# Patient Record
Sex: Female | Born: 1965 | Race: White | Hispanic: No | Marital: Married | Smoking: Former smoker
Health system: Southern US, Community
[De-identification: ages and names within clinical notes are randomized; demographics above are authoritative.]

## PROBLEM LIST (undated history)

## (undated) DIAGNOSIS — Z9889 Other specified postprocedural states: Secondary | ICD-10-CM

## (undated) DIAGNOSIS — O3440 Maternal care for other abnormalities of cervix, unspecified trimester: Secondary | ICD-10-CM

## (undated) DIAGNOSIS — K219 Gastro-esophageal reflux disease without esophagitis: Secondary | ICD-10-CM

## (undated) DIAGNOSIS — K912 Postsurgical malabsorption, not elsewhere classified: Secondary | ICD-10-CM

## (undated) DIAGNOSIS — N393 Stress incontinence (female) (male): Secondary | ICD-10-CM

## (undated) DIAGNOSIS — J302 Other seasonal allergic rhinitis: Secondary | ICD-10-CM

## (undated) HISTORY — DX: Postsurgical malabsorption, not elsewhere classified: K91.2

## (undated) HISTORY — DX: Morbid (severe) obesity due to excess calories: E66.01

---

## 1989-09-26 HISTORY — PX: CHOLECYSTECTOMY: SHX55

## 1989-09-26 HISTORY — PX: TUBAL LIGATION: SHX77

## 2000-09-13 ENCOUNTER — Encounter: Admission: RE | Admit: 2000-09-13 | Discharge: 2000-10-03 | Payer: Self-pay | Admitting: Occupational Medicine

## 2001-11-18 ENCOUNTER — Emergency Department (HOSPITAL_COMMUNITY): Admission: EM | Admit: 2001-11-18 | Discharge: 2001-11-19 | Payer: Self-pay | Admitting: *Deleted

## 2001-11-19 ENCOUNTER — Encounter: Payer: Self-pay | Admitting: Emergency Medicine

## 2001-11-20 ENCOUNTER — Emergency Department (HOSPITAL_COMMUNITY): Admission: EM | Admit: 2001-11-20 | Discharge: 2001-11-20 | Payer: Self-pay

## 2001-12-28 ENCOUNTER — Encounter: Admission: RE | Admit: 2001-12-28 | Discharge: 2001-12-28 | Payer: Self-pay | Admitting: Family Medicine

## 2002-01-21 ENCOUNTER — Encounter: Admission: RE | Admit: 2002-01-21 | Discharge: 2002-01-21 | Payer: Self-pay | Admitting: Family Medicine

## 2002-01-21 ENCOUNTER — Encounter (INDEPENDENT_AMBULATORY_CARE_PROVIDER_SITE_OTHER): Payer: Self-pay | Admitting: *Deleted

## 2002-01-28 ENCOUNTER — Encounter: Admission: RE | Admit: 2002-01-28 | Discharge: 2002-01-28 | Payer: Self-pay | Admitting: Family Medicine

## 2002-02-04 ENCOUNTER — Encounter: Admission: RE | Admit: 2002-02-04 | Discharge: 2002-02-04 | Payer: Self-pay | Admitting: Family Medicine

## 2002-02-05 ENCOUNTER — Encounter: Admission: RE | Admit: 2002-02-05 | Discharge: 2002-02-05 | Payer: Self-pay | Admitting: Family Medicine

## 2002-02-18 ENCOUNTER — Encounter: Admission: RE | Admit: 2002-02-18 | Discharge: 2002-02-18 | Payer: Self-pay | Admitting: Family Medicine

## 2002-02-20 ENCOUNTER — Encounter: Admission: RE | Admit: 2002-02-20 | Discharge: 2002-02-20 | Payer: Self-pay | Admitting: Family Medicine

## 2002-03-20 ENCOUNTER — Encounter: Admission: RE | Admit: 2002-03-20 | Discharge: 2002-03-20 | Payer: Self-pay | Admitting: Family Medicine

## 2002-04-10 ENCOUNTER — Encounter: Admission: RE | Admit: 2002-04-10 | Discharge: 2002-04-10 | Payer: Self-pay | Admitting: Family Medicine

## 2002-05-22 ENCOUNTER — Encounter: Admission: RE | Admit: 2002-05-22 | Discharge: 2002-05-22 | Payer: Self-pay | Admitting: Family Medicine

## 2002-07-22 ENCOUNTER — Encounter: Admission: RE | Admit: 2002-07-22 | Discharge: 2002-07-22 | Payer: Self-pay | Admitting: Family Medicine

## 2002-07-30 ENCOUNTER — Encounter: Payer: Self-pay | Admitting: *Deleted

## 2002-07-30 ENCOUNTER — Ambulatory Visit (HOSPITAL_COMMUNITY): Admission: RE | Admit: 2002-07-30 | Discharge: 2002-07-30 | Payer: Self-pay | Admitting: *Deleted

## 2002-08-09 ENCOUNTER — Encounter: Admission: RE | Admit: 2002-08-09 | Discharge: 2002-08-09 | Payer: Self-pay | Admitting: Obstetrics and Gynecology

## 2003-04-30 ENCOUNTER — Encounter: Admission: RE | Admit: 2003-04-30 | Discharge: 2003-04-30 | Payer: Self-pay

## 2003-05-06 ENCOUNTER — Encounter: Admission: RE | Admit: 2003-05-06 | Discharge: 2003-05-06 | Payer: Self-pay | Admitting: Family Medicine

## 2003-05-29 ENCOUNTER — Encounter: Admission: RE | Admit: 2003-05-29 | Discharge: 2003-05-29 | Payer: Self-pay | Admitting: Family Medicine

## 2003-06-05 ENCOUNTER — Encounter: Admission: RE | Admit: 2003-06-05 | Discharge: 2003-06-05 | Payer: Self-pay | Admitting: Obstetrics and Gynecology

## 2003-08-13 ENCOUNTER — Encounter: Admission: RE | Admit: 2003-08-13 | Discharge: 2003-08-13 | Payer: Self-pay | Admitting: Family Medicine

## 2003-08-20 ENCOUNTER — Encounter: Admission: RE | Admit: 2003-08-20 | Discharge: 2003-08-20 | Payer: Self-pay | Admitting: Sports Medicine

## 2003-12-08 ENCOUNTER — Encounter: Admission: RE | Admit: 2003-12-08 | Discharge: 2003-12-08 | Payer: Self-pay | Admitting: Family Medicine

## 2004-01-05 ENCOUNTER — Encounter: Admission: RE | Admit: 2004-01-05 | Discharge: 2004-01-05 | Payer: Self-pay | Admitting: Family Medicine

## 2004-05-27 ENCOUNTER — Encounter (INDEPENDENT_AMBULATORY_CARE_PROVIDER_SITE_OTHER): Payer: Self-pay | Admitting: *Deleted

## 2004-05-27 LAB — CONVERTED CEMR LAB

## 2004-06-11 ENCOUNTER — Encounter: Admission: RE | Admit: 2004-06-11 | Discharge: 2004-06-11 | Payer: Self-pay | Admitting: Sports Medicine

## 2004-06-18 ENCOUNTER — Ambulatory Visit: Payer: Self-pay | Admitting: Family Medicine

## 2004-09-22 ENCOUNTER — Ambulatory Visit (HOSPITAL_COMMUNITY): Admission: RE | Admit: 2004-09-22 | Discharge: 2004-09-22 | Payer: Self-pay | Admitting: Surgery

## 2004-09-23 ENCOUNTER — Encounter: Admission: RE | Admit: 2004-09-23 | Discharge: 2004-12-22 | Payer: Self-pay | Admitting: Surgery

## 2004-10-05 ENCOUNTER — Encounter: Admission: RE | Admit: 2004-10-05 | Discharge: 2004-10-05 | Payer: Self-pay | Admitting: Surgery

## 2004-10-26 ENCOUNTER — Ambulatory Visit: Payer: Self-pay | Admitting: Obstetrics and Gynecology

## 2004-10-26 ENCOUNTER — Other Ambulatory Visit: Admission: RE | Admit: 2004-10-26 | Discharge: 2004-10-26 | Payer: Self-pay | Admitting: Obstetrics and Gynecology

## 2004-11-09 ENCOUNTER — Ambulatory Visit: Payer: Self-pay | Admitting: Obstetrics and Gynecology

## 2005-05-31 ENCOUNTER — Ambulatory Visit: Payer: Self-pay | Admitting: Family Medicine

## 2005-06-03 ENCOUNTER — Ambulatory Visit: Payer: Self-pay | Admitting: Family Medicine

## 2005-11-02 ENCOUNTER — Encounter: Admission: RE | Admit: 2005-11-02 | Discharge: 2005-11-02 | Payer: Self-pay | Admitting: Sports Medicine

## 2005-11-03 ENCOUNTER — Encounter (INDEPENDENT_AMBULATORY_CARE_PROVIDER_SITE_OTHER): Payer: Self-pay | Admitting: Specialist

## 2005-11-03 ENCOUNTER — Other Ambulatory Visit: Admission: RE | Admit: 2005-11-03 | Discharge: 2005-11-03 | Payer: Self-pay | Admitting: Family Medicine

## 2005-11-03 ENCOUNTER — Ambulatory Visit: Payer: Self-pay | Admitting: Family Medicine

## 2005-12-13 ENCOUNTER — Ambulatory Visit: Payer: Self-pay | Admitting: Sports Medicine

## 2006-09-06 ENCOUNTER — Ambulatory Visit: Payer: Self-pay | Admitting: Sports Medicine

## 2006-11-23 DIAGNOSIS — E785 Hyperlipidemia, unspecified: Secondary | ICD-10-CM | POA: Insufficient documentation

## 2006-11-23 DIAGNOSIS — I10 Essential (primary) hypertension: Secondary | ICD-10-CM

## 2006-11-23 DIAGNOSIS — F17200 Nicotine dependence, unspecified, uncomplicated: Secondary | ICD-10-CM | POA: Insufficient documentation

## 2006-11-23 DIAGNOSIS — R87613 High grade squamous intraepithelial lesion on cytologic smear of cervix (HGSIL): Secondary | ICD-10-CM

## 2006-11-23 DIAGNOSIS — L732 Hidradenitis suppurativa: Secondary | ICD-10-CM

## 2006-11-24 ENCOUNTER — Encounter (INDEPENDENT_AMBULATORY_CARE_PROVIDER_SITE_OTHER): Payer: Self-pay | Admitting: *Deleted

## 2007-01-18 ENCOUNTER — Telehealth: Payer: Self-pay | Admitting: *Deleted

## 2007-01-19 ENCOUNTER — Ambulatory Visit: Payer: Self-pay | Admitting: Family Medicine

## 2007-02-01 ENCOUNTER — Encounter: Admission: RE | Admit: 2007-02-01 | Discharge: 2007-02-01 | Payer: Self-pay | Admitting: Sports Medicine

## 2007-02-02 ENCOUNTER — Encounter (INDEPENDENT_AMBULATORY_CARE_PROVIDER_SITE_OTHER): Payer: Self-pay | Admitting: Family Medicine

## 2007-02-02 ENCOUNTER — Ambulatory Visit: Payer: Self-pay | Admitting: Family Medicine

## 2007-02-02 DIAGNOSIS — N3946 Mixed incontinence: Secondary | ICD-10-CM

## 2007-02-02 LAB — CONVERTED CEMR LAB
Bilirubin Urine: NEGATIVE
Blood in Urine, dipstick: NEGATIVE
Glucose, Urine, Semiquant: NEGATIVE
Ketones, urine, test strip: NEGATIVE
Nitrite: NEGATIVE
Protein, U semiquant: NEGATIVE
Specific Gravity, Urine: 1.025
Urobilinogen, UA: 0.2
WBC Urine, dipstick: NEGATIVE
Whiff Test: POSITIVE
pH: 5.5

## 2007-02-06 ENCOUNTER — Telehealth: Payer: Self-pay | Admitting: *Deleted

## 2007-02-20 ENCOUNTER — Encounter (INDEPENDENT_AMBULATORY_CARE_PROVIDER_SITE_OTHER): Payer: Self-pay | Admitting: Family Medicine

## 2008-04-10 ENCOUNTER — Ambulatory Visit: Payer: Self-pay | Admitting: Family Medicine

## 2008-04-10 ENCOUNTER — Encounter: Admission: RE | Admit: 2008-04-10 | Discharge: 2008-04-10 | Payer: Self-pay | Admitting: Family Medicine

## 2008-04-10 LAB — CONVERTED CEMR LAB: Whiff Test: POSITIVE

## 2008-12-04 ENCOUNTER — Telehealth: Payer: Self-pay | Admitting: *Deleted

## 2008-12-04 ENCOUNTER — Ambulatory Visit: Payer: Self-pay | Admitting: Family Medicine

## 2009-06-11 ENCOUNTER — Ambulatory Visit: Payer: Self-pay | Admitting: Family Medicine

## 2009-06-11 LAB — CONVERTED CEMR LAB
Bilirubin Urine: NEGATIVE
Blood in Urine, dipstick: NEGATIVE
Glucose, Urine, Semiquant: NEGATIVE
Ketones, urine, test strip: NEGATIVE
Nitrite: NEGATIVE
Protein, U semiquant: NEGATIVE
Specific Gravity, Urine: 1.025
Urobilinogen, UA: 0.2
pH: 5.5

## 2009-07-07 ENCOUNTER — Encounter (INDEPENDENT_AMBULATORY_CARE_PROVIDER_SITE_OTHER): Payer: Self-pay | Admitting: Family Medicine

## 2009-07-07 ENCOUNTER — Ambulatory Visit: Payer: Self-pay | Admitting: Family Medicine

## 2009-07-07 LAB — CONVERTED CEMR LAB: Whiff Test: NEGATIVE

## 2009-07-09 ENCOUNTER — Encounter: Payer: Self-pay | Admitting: Family Medicine

## 2009-07-28 ENCOUNTER — Encounter: Payer: Self-pay | Admitting: Family Medicine

## 2009-07-28 ENCOUNTER — Ambulatory Visit: Payer: Self-pay | Admitting: Family Medicine

## 2009-07-29 ENCOUNTER — Encounter: Payer: Self-pay | Admitting: Family Medicine

## 2009-07-29 LAB — CONVERTED CEMR LAB
ALT: 20 units/L (ref 0–35)
AST: 17 units/L (ref 0–37)
Albumin: 4.2 g/dL (ref 3.5–5.2)
Alkaline Phosphatase: 54 units/L (ref 39–117)
BUN: 10 mg/dL (ref 6–23)
CO2: 24 meq/L (ref 19–32)
Calcium: 9.2 mg/dL (ref 8.4–10.5)
Chloride: 103 meq/L (ref 96–112)
Cholesterol: 199 mg/dL (ref 0–200)
Creatinine, Ser: 0.74 mg/dL (ref 0.40–1.20)
Glucose, Bld: 91 mg/dL (ref 70–99)
HDL: 33 mg/dL — ABNORMAL LOW (ref >39)
LDL Cholesterol: 144 mg/dL — ABNORMAL HIGH (ref 0–99)
Potassium: 4.8 meq/L (ref 3.5–5.3)
Sodium: 138 meq/L (ref 135–145)
Total Bilirubin: 0.3 mg/dL (ref 0.3–1.2)
Total CHOL/HDL Ratio: 6
Total Protein: 6.9 g/dL (ref 6.0–8.3)
Triglycerides: 108 mg/dL (ref <150)
VLDL: 22 mg/dL (ref 0–40)

## 2009-08-03 ENCOUNTER — Telehealth: Payer: Self-pay | Admitting: *Deleted

## 2009-08-05 ENCOUNTER — Encounter: Payer: Self-pay | Admitting: *Deleted

## 2009-09-24 ENCOUNTER — Telehealth: Payer: Self-pay | Admitting: Family Medicine

## 2009-09-24 ENCOUNTER — Ambulatory Visit: Payer: Self-pay | Admitting: Family Medicine

## 2009-09-29 ENCOUNTER — Ambulatory Visit (HOSPITAL_COMMUNITY)
Admission: RE | Admit: 2009-09-29 | Discharge: 2009-09-29 | Payer: Self-pay | Source: Home / Self Care | Admitting: Family Medicine

## 2009-10-01 ENCOUNTER — Encounter: Payer: Self-pay | Admitting: Family Medicine

## 2010-08-03 ENCOUNTER — Encounter: Payer: Self-pay | Admitting: Family Medicine

## 2010-08-24 ENCOUNTER — Ambulatory Visit: Payer: Self-pay | Admitting: Family Medicine

## 2010-10-26 NOTE — Miscellaneous (Signed)
   Clinical Lists Changes  Problems: Removed problem of URI (ICD-465.9) Removed problem of ROUTINE GYNECOLOGICAL EXAMINATION (ICD-V72.31) Removed problem of SCREENING FOR MALIGNANT NEOPLASM OF THE CERVIX (ICD-V76.2) Removed problem of VAGINITIS NOS (ICD-616.10)

## 2010-10-26 NOTE — Assessment & Plan Note (Signed)
Summary: congestion/headaches/bmc   Vital Signs:  Patient profile:   45 year old female Weight:      247.4 pounds Temp:     98.1 degrees F oral Pulse rate:   84 / minute BP sitting:   130 / 76  (left arm) Cuff size:   large  Vitals Entered By: Arlyss Repress CMA, (August 24, 2010 10:51 AM) CC: cough and congestion x 2 weeks. Is Patient Diabetic? No Pain Assessment Patient in pain? no        Primary Care Provider:  Clementeen Graham MD  CC:  cough and congestion x 2 weeks.Marland Kitchen  History of Present Illness: 45 yo F:  1. Cough: x 2 weeks, associated with nasal congestion, runny nose, intermittent sinus HA, right ear pain, sore throat, chest congestion, nausea, and subjective fever/chills. Denies dizziness, CP, SOB, LE edema, rash. + sick contacts. + tobacco - 1 ppd x 5 years. Denies asthma or allergies.  Habits & Providers  Alcohol-Tobacco-Diet     Tobacco Status: current     Tobacco Counseling: to quit use of tobacco products     Cigarette Packs/Day: 1.0  Current Medications (verified): 1)  Zyrtec Allergy 10 Mg Caps (Cetirizine Hcl) .Marland Kitchen.. 1 By Mouth Once Daily For Sinus Pressure 2)  Chantix 1 Mg Tabs (Varenicline Tartrate) .... Take One Twice Daily By Mouth. Do Not Fill Until 08/26/09 3)  Chantix Starting Month Pak 0.5 Mg X 11 & 1 Mg X 42 Tabs (Varenicline Tartrate) .... Take As Directed. 4)  Amoxicillin 500 Mg Caps (Amoxicillin) .... Two Tabs Two Times A Day For 7 Days 5)  Flonase 50 Mcg/act Susp (Fluticasone Propionate) .... Two Squirts in Each Nostril Daily 6)  Amoxicillin 500 Mg Tabs (Amoxicillin) .... One By Mouth Daily X 3 Days 7)  Tessalon Perles 100 Mg  Caps (Benzonatate) .... One By Mouth Three Times A Day As Needed Cough 8)  Fluticasone Propionate 50 Mcg/act  Susp (Fluticasone Propionate) .... 2 Sprays Each Nostril Once Daily  Allergies (verified): 1)  Avelox PMH-FH-SH reviewed for relevance  Social History: Packs/Day:  1.0  Review of Systems      See  HPI  Physical Exam  General:  Alert, overweight, in no acute distress. Vitals reviewed. Head:  Normocephalic and atraumatic without obvious abnormalities.  Eyes:  No injection. Ears:  R ear normal and L ear normal.   Nose:  Nasal discharge, mucosal pallor.   Mouth:  Oral mucosa and oropharynx without lesions or exudates.  PND. Neck:  No deformities, masses, or tenderness noted. Lungs:  Normal respiratory effort, chest expands symmetrically. Lungs are clear to auscultation, no crackles or wheezes. Heart:  Normal rate and regular rhythm.   Pulses:  R and L cdorsalis pedis and posterior tibial pulses are full and equal bilaterally. Extremities:  No edema. Skin:  Intact without suspicious lesions or rashes.   Impression & Recommendations:  Problem # 1:  UPPER RESPIRATORY INFECTION (ICD-465.9) Assessment New  URI, likely viral. Discussed symptomatic treatment. Rx Tessalon. Suggested OTC Zyrtec/Claritin to cover allergy component as well. Patient stated that Zyrtec "knocks her out," and that Claritin "doesn't work." Therapist, occupational. Patient requests Abx. Rx Amox, but told to hold and only Rx if worsens. Note given for work. Advised patient to stop smoking. Her updated medication list for this problem includes:    Zyrtec Allergy 10 Mg Caps (Cetirizine hcl) .Marland Kitchen... 1 by mouth once daily for sinus pressure    Tessalon Perles 100 Mg Caps (Benzonatate) ..... One by  mouth three times a day as needed cough  Orders: [facility]- Est Level  3 (16109)  Problem # 2:  TOBACCO DEPENDENCE (ICD-305.1) Assessment: Unchanged  Counseled to quit. She is not ready.  Orders: [facility]- Est Level  3 (60454)  Complete Medication List: 1)  Zyrtec Allergy 10 Mg Caps (Cetirizine hcl) .Marland Kitchen.. 1 by mouth once daily for sinus pressure 2)  Chantix 1 Mg Tabs (Varenicline tartrate) .... Take one twice daily by mouth. do not fill until 08/26/09 3)  Chantix Starting Month Pak 0.5 Mg X 11 & 1 Mg X 42 Tabs (Varenicline tartrate) .... Take  as directed. 4)  Amoxicillin 500 Mg Caps (Amoxicillin) .... Two tabs two times a day for 7 days 5)  Flonase 50 Mcg/act Susp (Fluticasone propionate) .... Two squirts in each nostril daily 6)  Amoxicillin 500 Mg Tabs (Amoxicillin) .... One by mouth daily x 3 days 7)  Tessalon Perles 100 Mg Caps (Benzonatate) .... One by mouth three times a day as needed cough 8)  Fluticasone Propionate 50 Mcg/act Susp (Fluticasone propionate) .... 2 sprays each nostril once daily  Patient Instructions: 1)  It was nice to meet you today! Prescriptions: FLUTICASONE PROPIONATE 50 MCG/ACT  SUSP (FLUTICASONE PROPIONATE) 2 sprays each nostril once daily  #1 vial x 1   Entered and Authorized by:   Helane Rima DO   Signed by:   Helane Rima DO on 08/24/2010   Method used:   Print then Give to Patient   RxID:   [id number] TESSALON PERLES 100 MG  CAPS (BENZONATATE) one by mouth three times a day as needed cough  #24 x 0   Entered and Authorized by:   Helane Rima DO   Signed by:   Helane Rima DO on 08/24/2010   Method used:   Print then Give to Patient   RxID:   [id number] AMOXICILLIN 500 MG TABS (AMOXICILLIN) one by mouth daily x 3 days  #3 x 0   Entered and Authorized by:   Helane Rima DO   Signed by:   Helane Rima DO on 08/24/2010   Method used:   Print then Give to Patient   RxID:   [id number]    Orders Added: 1)  [facility]- Est Level  3 [44034]

## 2010-10-26 NOTE — Miscellaneous (Signed)
Summary: [facility] CT of sinuses   Clinical Lists Changes  Observations: Added new observation of CT OF SINUS: Normal exam, no inflammation, normal middle ears. (09/29/2009 8:19)      CT of Sinus  Procedure date:  09/29/2009  Findings:      Normal exam, no inflammation, normal middle ears.

## 2011-02-11 NOTE — Group Therapy Note (Signed)
NAME:  Brenda Ware NO.:  [id number]   MEDICAL RECORD NO.:  [id number]          PATIENT TYPE:  WOC   LOCATION:  [facility]                   FACILITY:  [facility]   PHYSICIAN:  Argentina Donovan, MD        DATE OF BIRTH:  1966-07-03   DATE OF SERVICE:  10/26/2004                                    CLINIC NOTE   CHIEF COMPLAINT:  Follow-up cervical dysplasia.   SUBJECTIVE:  [name] is here today for follow-up.  Unfortunately, she was  lost to follow-up for some amount of time.  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  A LEEP  was attempted at that time but the cervix could not be reached due to  inappropriate equipment.  She then did not follow up for quite some time.  A  repeat Pap in 2004 showed equivocal high-risk HPV and ASCUS.  She then had a  Pap smear at the [facility] in September 2005 which showed CIN-1  and possibly higher grade.  She is here today for treatment.  She currently  has no complaints.   On exam, a left upper thigh abscess is noted.  The patient states it has  been there for about a week or two and states she has another one near her  groin.  Denies any shaving in those areas and denies any medical problems  such as diabetes.  She has been on Flagyl for a mouth infection but has not  been treated for the abscesses.   OBJECTIVE:  VITAL SIGNS:  Noted.  GENERAL:  She is an overweight female in no acute distress.  EXTREMITIES:  Left upper thigh shows an erythematous, fluctuant abscess  approximately 1.5 x 1.5 cm with some pain to palpation.  PELVIC:  Reveals normal external female genitalia, normal urethra and  vaginal walls.  Cervix by colposcopy shows some acetowhite throughout the 12  o'clock and 6 o'clock regions.  There is some fine vascularization.   PROCEDURE:  The patient was consented for a colposcopy and LEEP.  She was  placed in lithotomy position.  The large insulated  speculum and side wall  retractor were inserted.  A colposcope was used with acetowhite to determine  the appropriate transformation zone and abnormal cells.  Xylocaine  approximately 1 mL at each location was used at 2, 4, 8, and 10 o'clock.  There was minimal bleeding.  A Fischer LEEP was used to remove the abnormal  area.  The Bovie was used to cauterize the zone between the removed tissue  and healthy cervix.  Monsel's solution was used for excellent hemostasis.  The patient tolerated the procedure without complications.   ASSESSMENT AND PLAN:  1.  Cervical dysplasia.  She is now status post loop electrocautery excision      procedure.  She will return in 2 weeks for follow-up.  She was told of      the complications that she should return immediately for including      severe bleeding, cramping, and fevers.  2.  Folliculitis with abscess.  Sitz baths were recommended and she was      written for Keflex 500 t.i.d. for 1 week.   She is to return in 2 weeks for follow-up.      LC/MEDQ  D:  10/26/2004  T:  10/26/2004  Job:  [id number]

## 2011-02-11 NOTE — Group Therapy Note (Signed)
   NAME:  Brenda Ware, Brenda Ware                           ACCOUNT NO.:  [id number]   MEDICAL RECORD NO.:  [id number]                   PATIENT TYPE:  OUT   LOCATION:  [facility]                           FACILITY:  [facility]   PHYSICIAN:  Argentina Donovan, MD                     DATE OF BIRTH:  [birth year]   DATE OF SERVICE:  06/05/2003                                    CLINIC NOTE   HISTORY OF PRESENT ILLNESS:  The patient is a 45 year old patient who had an  abnormal Pap smear some time last year.  Had a colposcopy in May of 2003  that showed CIN II.  She was scheduled for a LEEP and was seen at [facility]  in November of 2003.  At that time cervical access which was deviated to the  right vaginal wall did not give the surgeon enough clearance to do the LEEP  and no large speculum was available at that time and she was to be  rescheduled.  Unfortunately, she has not shown up until now and on  examination with a large speculum we easily see and can isolate the cervix.  I would use a large Graves speculum and a lateral retractor to accomplish  the LEEP which I think could be done quite easily in the office if it needs  to be done.  In order to check on this we have repeated the Pap smear and if  it is abnormal we will go ahead and do the LEEP without a preoperative  colposcopy.  Will do that at the same time as she comes in for examination  and treat.   IMPRESSION:  Previous cervical dysplasia, untreated.                                               Argentina Donovan, MD    PR/MEDQ  D:  06/05/2003  T:  06/05/2003  Job:  [id number]

## 2011-07-26 ENCOUNTER — Other Ambulatory Visit: Payer: Self-pay | Admitting: Family Medicine

## 2011-07-26 DIAGNOSIS — Z1231 Encounter for screening mammogram for malignant neoplasm of breast: Secondary | ICD-10-CM

## 2011-08-01 ENCOUNTER — Other Ambulatory Visit (HOSPITAL_COMMUNITY)
Admission: RE | Admit: 2011-08-01 | Discharge: 2011-08-01 | Disposition: A | Discharge status: Home / Self Care | Payer: BC Managed Care – PPO | Source: Ambulatory Visit | Attending: Family Medicine | Admitting: Family Medicine

## 2011-08-01 ENCOUNTER — Encounter: Payer: Self-pay | Admitting: Family Medicine

## 2011-08-01 ENCOUNTER — Ambulatory Visit (INDEPENDENT_AMBULATORY_CARE_PROVIDER_SITE_OTHER): Payer: BC Managed Care – PPO | Admitting: Family Medicine

## 2011-08-01 VITALS — BP 122/72 | HR 78 | Temp 98.1°F | Ht 65.0 in | Wt 261.0 lb

## 2011-08-01 DIAGNOSIS — F17200 Nicotine dependence, unspecified, uncomplicated: Secondary | ICD-10-CM

## 2011-08-01 DIAGNOSIS — J309 Allergic rhinitis, unspecified: Secondary | ICD-10-CM

## 2011-08-01 DIAGNOSIS — N3946 Mixed incontinence: Secondary | ICD-10-CM

## 2011-08-01 DIAGNOSIS — Z124 Encounter for screening for malignant neoplasm of cervix: Secondary | ICD-10-CM

## 2011-08-01 DIAGNOSIS — R87613 High grade squamous intraepithelial lesion on cytologic smear of cervix (HGSIL): Secondary | ICD-10-CM

## 2011-08-01 DIAGNOSIS — Z01419 Encounter for gynecological examination (general) (routine) without abnormal findings: Secondary | ICD-10-CM | POA: Insufficient documentation

## 2011-08-02 ENCOUNTER — Encounter: Payer: Self-pay | Admitting: Family Medicine

## 2011-08-02 DIAGNOSIS — J309 Allergic rhinitis, unspecified: Secondary | ICD-10-CM | POA: Insufficient documentation

## 2011-08-02 NOTE — Assessment & Plan Note (Signed)
Stress incontinence: to see urologist in December. Patient was also reminded of Kegel exercises.

## 2011-08-02 NOTE — Progress Notes (Signed)
  Subjective:    Patient ID: Brenda Ware, female    DOB: 1966-07-10, 45 y.o.   MRN: [id number]  HPI 45 yo female who presents for annual physical exam.  1. Her primary complaint is itching of the right eye with a little bit of swelling that started the day before. She denies any drainage, any pain, any change in vision, any crusted lesions or fever. She also states that she feels chronic sinus pressure in her ears and that has been going on for a couple of years. She cannot take zyrtec because it makes her drowsy and claritin does not work for her. She doesn't like taking nasal sprays so she hasn't ever taken flonase. The sinus pressure has been associated with some coughing.Symptoms are worst at night and seem associated with wheezing. She denies any shortness of breath. She finds that her symptoms are relieved with sudafed. She had a sinus CT done in 2011 that didn't show any abnormality.   2. Smoking cessation: She smokes 1 pack per day and is afraid to quit due to the weight gain.  3. Stress incontinence: leaks urine with coughing, sneezing and even sometimes without any exacerbation. 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 She knows about Keggel exercises and does them once in a while. She is scheduled to see a urologist in December.  4. Gyn: she is sexually active. She denies any discharge, any abnormal bleeding, any burning or itching. Her periods are regular, every month and last 3 days.     Review of Systems Negative except per HPI    Objective:   Physical Exam Filed Vitals:   08/01/11 1018  BP: 122/72  Pulse: 78  Temp: 98.1 F (36.7 C)  Physical Examination: General appearance - alert, well appearing, and in no distress and obese Eyes - pupils equal and reactive, extraocular eye movements intact, right eyelid mildly swollen in medial aspect. No discharge, no erythema, no tenderness to touch, conjunctiva  white and healthy.  Ears - bilateral TM's and external ear canals normal, minimal dulling of right membrane but no fluid level.  Nose - enflamed nasal turbinates.  Mouth - mucous membranes moist, pharynx normal without lesions and lymphoid hyperplasia noted in oropharynx Neck - supple, no significant adenopathy Chest - clear to auscultation, no wheezes, rales or rhonchi, symmetric air entry Heart - normal rate, regular rhythm, normal S1, S2, no murmurs, rubs, clicks or gallops Abdomen - soft, nontender, nondistended, no masses or organomegaly Breasts - firm cystic mass on left breast which pt states has been there since age of 45yo. Otherwise, no lumps or masses noted. No nipple discharge or skin changes.  Pelvic - normal external genitalia, vulva, vagina, cervix, uterus and adnexa, CERVIX: normal appearing cervix without discharge or lesions Extremities - peripheral pulses normal, no pedal edema, no clubbing or cyanosis        Assessment & Plan:

## 2011-08-02 NOTE — Assessment & Plan Note (Signed)
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 Her following pap smears have all been normal. Will obtain pap smear. Not at risk for GC/Chl.

## 2011-08-02 NOTE — Assessment & Plan Note (Signed)
Precontemplation stage: fear of quitting due to weight gain. Told patient that I would be available whenever she decided to quit or wanted more information and could provide her with support to avoid excessive weight gain if she were to choose to quit.

## 2011-08-02 NOTE — Assessment & Plan Note (Signed)
Will start patient on Allegra D which will hopefully not make her drowsy and also provide a decongestant. The swelling of her eyelid appears to be also due allergic reaction. No evidence of bacterial conjunctivitis.

## 2011-08-05 ENCOUNTER — Encounter: Payer: Self-pay | Admitting: Family Medicine

## 2011-08-15 ENCOUNTER — Ambulatory Visit (INDEPENDENT_AMBULATORY_CARE_PROVIDER_SITE_OTHER): Payer: BC Managed Care – PPO | Admitting: Family Medicine

## 2011-08-15 ENCOUNTER — Encounter: Payer: Self-pay | Admitting: Family Medicine

## 2011-08-15 VITALS — BP 134/91 | HR 80 | Temp 97.9°F | Ht 65.0 in | Wt 266.3 lb

## 2011-08-15 DIAGNOSIS — N390 Urinary tract infection, site not specified: Secondary | ICD-10-CM

## 2011-08-15 DIAGNOSIS — R358 Other polyuria: Secondary | ICD-10-CM

## 2011-08-15 DIAGNOSIS — R3589 Other polyuria: Secondary | ICD-10-CM

## 2011-08-15 LAB — POCT URINALYSIS DIPSTICK
Bilirubin, UA: NEGATIVE
Glucose, UA: NEGATIVE
Ketones, UA: NEGATIVE
Protein, UA: NEGATIVE
Spec Grav, UA: 1.025
Urobilinogen, UA: 0.2

## 2011-08-15 LAB — POCT UA - MICROSCOPIC ONLY

## 2011-08-15 MED ORDER — CEPHALEXIN 500 MG PO CAPS: 500.0000 mg | ORAL_CAPSULE | Freq: Two times a day (BID) | ORAL | Status: AC

## 2011-08-15 NOTE — Patient Instructions (Signed)

## 2011-08-15 NOTE — Progress Notes (Signed)
  Subjective:    Patient ID: Brenda Ware, female    DOB: 05-Mar-1966, 45 y.o.   MRN: [id number]  HPI DYSURIA  Onset: 3 days  Worsening: no    Symptoms Urgency: yes  Frequency: yes  Hesitancy: yes  Hematuria: no  Flank Pain: no  Fever: no   Highest Temp: n/a  Nausea/Vomiting: no  Pregnant: no STD exposure/history: no Discharge: no Irritants: no Rash: no  Red Flags  : (Risk Factors for Complicated UTI) Recent Antibiotic Usage (last 30 days): no  Symptoms lasting more than seven (7) days: no  More than 3 UTI's last 12 months: no  PMH of  1. DM: no 2. Renal Disease/Calculi: no 3. Urinary Tract Abnormality: no  4. Instrumentation/Trauma: no 5. Immunosuppression: no     Review of Systemssee hPI     Objective:   Physical Exam   GEN: Alert & Oriented, No acute distress CV:  Regular Rate & Rhythm, no murmur Respiratory:  Normal work of breathing, CTAB Abd:  + BS, soft, no tenderness to palpation. No flank pain      Assessment & Plan:

## 2011-08-15 NOTE — Assessment & Plan Note (Signed)
Keflex, gave patient info.  WIll culture

## 2011-08-22 NOTE — Progress Notes (Signed)
Clue cells notes, asymptomatic, will not treat at this time.

## 2011-09-06 ENCOUNTER — Ambulatory Visit: Payer: Self-pay

## 2012-01-26 ENCOUNTER — Other Ambulatory Visit: Payer: Self-pay | Admitting: Urology

## 2012-04-10 ENCOUNTER — Other Ambulatory Visit: Payer: Self-pay | Admitting: Urology

## 2012-04-10 ENCOUNTER — Encounter (HOSPITAL_BASED_OUTPATIENT_CLINIC_OR_DEPARTMENT_OTHER): Payer: Self-pay | Admitting: *Deleted

## 2012-04-16 NOTE — H&P (Signed)
History of Present Illness   Brenda Ware has stress incontinence that is stable. Her enuresis is dramatically better and we both think she was coughing at night and she no longer is doing that since her allergies are under good control now.  She has mild stable frequency.   She is highly motivated to get as dry as possible.  I drew her a picture and talked to her about physical therapy versus watchful waiting versus sling.  We talked about a sling in detail. Pros, cons, general surgical and anesthetic risks, and other options including behavioral therapy and watchful waiting were discussed. She understands that slings are generally successful in 90% of cases for stress incontinence, 50% for urge incontinence, and that in a small percentage of cases the incontinence can worsen. The risk of persistent, de novo, or worsening incontinence/dysfunction was discussed. Risks were described but not limited to the discussion of injury to neighboring structures including the bowel (with possible life-threatening sepsis and colostomy), bladder, urethra, vagina (all resulting in further surgery), and ureter (resulting in re-implantation). We also talked about the risk of retention requiring urethrolysis, extrusion requiring revision, and erosion resulting in further surgery. Bleeding risks and transfusion rates and the risk of infection were discussed. The risk of pelvic and abdominal pain syndromes, dyspareunia, and neuropathies were discussed. The need for CIC was described as well the usual post-operative course. The patient understands that she might not reach her treatment goal and that she might be worse following surgery.  Bleeding, pain and issues on TV were also discussed.  Review of systems: No change in bowel or neurologic status.  Urinalysis: I reviewed, negative.    Past Medical History Problems  1. History of  Hypercholesterolemia 272.0  Surgical History Problems  1. History of   Cholecystectomy 2. History of  Tubal Ligation V25.2  Current Meds 1. Toviaz 8 MG Oral Tablet Extended Release 24 Hour; TAKE 1 TABLET DAILY; Therapy: 22Mar2013  to (Evaluate:17Mar2014); Last Rx:22Mar2013  Allergies Medication  1. Doxycycline Hyclate CAPS  Family History Problems  1. Family history of  No Significant Family History  Social History Problems  1. Caffeine Use 2. Marital History - Single 3. Occupation: bus driver 4. Tobacco Use 305.1 14ppd x 5 yrs Denied  5. History of  Alcohol Use  Vitals Vital Signs [Data Includes: Last 1 Day]  01May2013 10:55AM  Blood Pressure: 140 / 83 Temperature: 98 F Heart Rate: 97  Results/Data  Urine [Data Includes: Last 1 Day]   01May2013  COLOR YELLOW   APPEARANCE CLEAR   SPECIFIC GRAVITY >1.030   pH 5.5   GLUCOSE NEG mg/dL  BILIRUBIN NEG   KETONE TRACE mg/dL  BLOOD MOD   PROTEIN NEG mg/dL  UROBILINOGEN 0.2 mg/dL  NITRITE NEG   LEUKOCYTE ESTERASE NEG   SQUAMOUS EPITHELIAL/HPF FEW   WBC 0-3 WBC/hpf  RBC 0-3 RBC/hpf  BACTERIA NONE SEEN   CRYSTALS NONE SEEN   CASTS NONE SEEN    Assessment Assessed  1. Enuresis Nocturnal 788.36 2. Female Stress Incontinence 625.6  Plan   Discussion/Summary   [name] would like to proceed with a sling. Hopefully she will reach her treatment goal.  History of Present Illness   Brenda Ware has stress incontinence that is stable. Her enuresis is dramatically better and we both think she was coughing at night and she no longer is doing that since her allergies are under good control now.  She has mild stable frequency.   She is highly  motivated to get as dry as possible.  I drew her a picture and talked to her about physical therapy versus watchful waiting versus sling.  We talked about a sling in detail. Pros, cons, general surgical and anesthetic risks, and other options including behavioral therapy and watchful waiting were discussed. She understands that slings are  generally successful in 90% of cases for stress incontinence, 50% for urge incontinence, and that in a small percentage of cases the incontinence can worsen. The risk of persistent, de novo, or worsening incontinence/dysfunction was discussed. Risks were described but not limited to the discussion of injury to neighboring structures including the bowel (with possible life-threatening sepsis and colostomy), bladder, urethra, vagina (all resulting in further surgery), and ureter (resulting in re-implantation). We also talked about the risk of retention requiring urethrolysis, extrusion requiring revision, and erosion resulting in further surgery. Bleeding risks and transfusion rates and the risk of infection were discussed. The risk of pelvic and abdominal pain syndromes, dyspareunia, and neuropathies were discussed. The need for CIC was described as well the usual post-operative course. The patient understands that she might not reach her treatment goal and that she might be worse following surgery.  Bleeding, pain and issues on TV were also discussed.  Review of systems: No change in bowel or neurologic status.  Urinalysis: I reviewed, negative.    Past Medical History Problems  1. History of  Hypercholesterolemia 272.0  Surgical History Problems  1. History of  Cholecystectomy 2. History of  Tubal Ligation V25.2  Current Meds 1. Toviaz 8 MG Oral Tablet Extended Release 24 Hour; TAKE 1 TABLET DAILY; Therapy: 22Mar2013  to (Evaluate:17Mar2014); Last Rx:22Mar2013  Allergies Medication  1. Doxycycline Hyclate CAPS  Family History Problems  1. Family history of  No Significant Family History  Social History Problems  1. Caffeine Use 2. Marital History - Single 3. Occupation: bus driver 4. Tobacco Use 305.1 14ppd x 5 yrs Denied  5. History of  Alcohol Use  Vitals Vital Signs [Data Includes: Last 1 Day]  01May2013 10:55AM  Blood Pressure: 140 / 83 Temperature: 98 F Heart Rate:  97  Results/Data  Urine [Data Includes: Last 1 Day]   01May2013  COLOR YELLOW   APPEARANCE CLEAR   SPECIFIC GRAVITY >1.030   pH 5.5   GLUCOSE NEG mg/dL  BILIRUBIN NEG   KETONE TRACE mg/dL  BLOOD MOD   PROTEIN NEG mg/dL  UROBILINOGEN 0.2 mg/dL  NITRITE NEG   LEUKOCYTE ESTERASE NEG   SQUAMOUS EPITHELIAL/HPF FEW   WBC 0-3 WBC/hpf  RBC 0-3 RBC/hpf  BACTERIA NONE SEEN   CRYSTALS NONE SEEN   CASTS NONE SEEN    Assessment Assessed  1. Enuresis Nocturnal 788.36 2. Female Stress Incontinence 625.6  Plan   Discussion/Summary   [name] would like to proceed with a sling. Hopefully she will reach her treatment goal.  After a thorough review of the management options for the patient's condition the patient  elected to proceed with surgical therapy as noted above. We have discussed the potential benefits and risks of the procedure, side effects of the proposed treatment, the likelihood of the patient achieving the goals of the procedure, and any potential problems that might occur during the procedure or recuperation. Informed consent has been obtained.

## 2012-04-16 NOTE — Progress Notes (Signed)
UNABLE TO REACH PT. SHE HAD LEFT VOICE MAIL MESSAGE TODAY TO CALL BACK, BUT WHEN CALLED BACK NO ANSWER.  LM ON PT PHONE [phone] AT 1654 TO ARRIVE AT 0815.  NPO AFTER MN. BRING LIST OF MEDS. NEEDS CBC, PT/PTT, T & S AND HX DONE ON ARRIVAL , DEPENDING ON HEALTH HX (? URINE PREG, IF PT HAS HTN OR DM NEEDS BMET ADDED TO LAB WORK) ALSO MAY NEED EKG.

## 2012-04-17 ENCOUNTER — Ambulatory Visit (HOSPITAL_BASED_OUTPATIENT_CLINIC_OR_DEPARTMENT_OTHER): Payer: BC Managed Care – PPO | Admitting: Anesthesiology

## 2012-04-17 ENCOUNTER — Encounter (HOSPITAL_BASED_OUTPATIENT_CLINIC_OR_DEPARTMENT_OTHER)
Admission: RE | Disposition: A | Discharge status: Home / Self Care | Payer: Self-pay | Source: Ambulatory Visit | Attending: Urology

## 2012-04-17 ENCOUNTER — Ambulatory Visit (HOSPITAL_BASED_OUTPATIENT_CLINIC_OR_DEPARTMENT_OTHER)
Admission: RE | Admit: 2012-04-17 | Discharge: 2012-04-17 | Disposition: A | Discharge status: Home / Self Care | Payer: BC Managed Care – PPO | Source: Ambulatory Visit | Attending: Urology | Admitting: Urology

## 2012-04-17 ENCOUNTER — Encounter (HOSPITAL_BASED_OUTPATIENT_CLINIC_OR_DEPARTMENT_OTHER): Payer: Self-pay | Admitting: *Deleted

## 2012-04-17 ENCOUNTER — Encounter (HOSPITAL_BASED_OUTPATIENT_CLINIC_OR_DEPARTMENT_OTHER): Payer: Self-pay | Admitting: Anesthesiology

## 2012-04-17 DIAGNOSIS — N393 Stress incontinence (female) (male): Secondary | ICD-10-CM | POA: Insufficient documentation

## 2012-04-17 DIAGNOSIS — N3944 Nocturnal enuresis: Secondary | ICD-10-CM | POA: Insufficient documentation

## 2012-04-17 DIAGNOSIS — E7800 Pure hypercholesterolemia, unspecified: Secondary | ICD-10-CM | POA: Insufficient documentation

## 2012-04-17 HISTORY — DX: Maternal care for other abnormalities of cervix, unspecified trimester: O34.40

## 2012-04-17 HISTORY — DX: Other seasonal allergic rhinitis: J30.2

## 2012-04-17 HISTORY — PX: BLADDER SUSPENSION: SHX72

## 2012-04-17 HISTORY — DX: Gastro-esophageal reflux disease without esophagitis: K21.9

## 2012-04-17 HISTORY — DX: Stress incontinence (female) (male): N39.3

## 2012-04-17 HISTORY — DX: Other specified postprocedural states: Z98.890

## 2012-04-17 LAB — CBC
MCH: 30.3 pg (ref 26.0–34.0)
MCHC: 34.8 g/dL (ref 30.0–36.0)
Platelets: 325 10*3/uL (ref 150–400)
RDW: 12.5 % (ref 11.5–15.5)

## 2012-04-17 LAB — TYPE AND SCREEN

## 2012-04-17 LAB — PROTIME-INR: Prothrombin Time: 12.8 seconds (ref 11.6–15.2)

## 2012-04-17 SURGERY — CYSTO
Anesthesia: General | Site: Uterus | Wound class: Clean Contaminated

## 2012-04-17 MED ORDER — GENTAMICIN IN SALINE 1.6-0.9 MG/ML-% IV SOLN
80.0000 mg | INTRAVENOUS | Status: AC
  Administered 2012-04-17: 80 mg via INTRAVENOUS

## 2012-04-17 MED ORDER — CIPROFLOXACIN HCL 250 MG PO TABS: 250.0000 mg | ORAL_TABLET | Freq: Two times a day (BID) | ORAL | Status: AC

## 2012-04-17 MED ORDER — DEXAMETHASONE SODIUM PHOSPHATE 4 MG/ML IJ SOLN
INTRAMUSCULAR | Status: DC | PRN
  Administered 2012-04-17: 5 mg via INTRAVENOUS

## 2012-04-17 MED ORDER — SUCCINYLCHOLINE CHLORIDE 20 MG/ML IJ SOLN
INTRAMUSCULAR | Status: DC | PRN
  Administered 2012-04-17: 100 mg via INTRAVENOUS

## 2012-04-17 MED ORDER — HYDROMORPHONE HCL PF 1 MG/ML IJ SOLN: 0.2500 mg | INTRAMUSCULAR | Status: DC | PRN

## 2012-04-17 MED ORDER — ONDANSETRON HCL 4 MG/2ML IJ SOLN
INTRAMUSCULAR | Status: DC | PRN
  Administered 2012-04-17: 4 mg via INTRAVENOUS

## 2012-04-17 MED ORDER — FENTANYL CITRATE 0.05 MG/ML IJ SOLN
INTRAMUSCULAR | Status: DC | PRN
  Administered 2012-04-17: 50 ug via INTRAVENOUS
  Administered 2012-04-17: 25 ug via INTRAVENOUS
  Administered 2012-04-17 (×2): 50 ug via INTRAVENOUS
  Administered 2012-04-17: 25 ug via INTRAVENOUS

## 2012-04-17 MED ORDER — SODIUM CHLORIDE 0.9 % IV SOLN
1.5000 g | INTRAVENOUS | Status: AC
  Administered 2012-04-17: 10:00:00 via INTRAVENOUS

## 2012-04-17 MED ORDER — PROMETHAZINE HCL 25 MG/ML IJ SOLN: 6.2500 mg | INTRAMUSCULAR | Status: DC | PRN

## 2012-04-17 MED ORDER — MIDAZOLAM HCL 5 MG/5ML IJ SOLN
INTRAMUSCULAR | Status: DC | PRN
  Administered 2012-04-17: 2 mg via INTRAVENOUS

## 2012-04-17 MED ORDER — LACTATED RINGERS IV SOLN: INTRAVENOUS | Status: DC

## 2012-04-17 MED ORDER — SODIUM CHLORIDE 0.9 % IR SOLN
Status: DC | PRN
  Administered 2012-04-17: 10:00:00

## 2012-04-17 MED ORDER — ESTRADIOL 0.1 MG/GM VA CREA
TOPICAL_CREAM | VAGINAL | Status: DC | PRN
  Administered 2012-04-17: 1 via VAGINAL

## 2012-04-17 MED ORDER — PROPOFOL 10 MG/ML IV EMUL
INTRAVENOUS | Status: DC | PRN
  Administered 2012-04-17: 200 mg via INTRAVENOUS

## 2012-04-17 MED ORDER — NEOSTIGMINE METHYLSULFATE 1 MG/ML IJ SOLN
INTRAMUSCULAR | Status: DC | PRN
  Administered 2012-04-17: 5 mg via INTRAVENOUS

## 2012-04-17 MED ORDER — LACTATED RINGERS IV SOLN
INTRAVENOUS | Status: DC
  Administered 2012-04-17 (×2): via INTRAVENOUS

## 2012-04-17 MED ORDER — ROCURONIUM BROMIDE 100 MG/10ML IV SOLN
INTRAVENOUS | Status: DC | PRN
  Administered 2012-04-17: 30 mg via INTRAVENOUS

## 2012-04-17 MED ORDER — DROPERIDOL 2.5 MG/ML IJ SOLN
INTRAMUSCULAR | Status: DC | PRN
  Administered 2012-04-17: 0.625 mg via INTRAVENOUS

## 2012-04-17 MED ORDER — ACETAMINOPHEN 10 MG/ML IV SOLN
INTRAVENOUS | Status: DC | PRN
  Administered 2012-04-17: 1000 mg via INTRAVENOUS

## 2012-04-17 MED ORDER — STERILE WATER FOR IRRIGATION IR SOLN
Status: DC | PRN
  Administered 2012-04-17: 3000 mL

## 2012-04-17 MED ORDER — INDIGOTINDISULFONATE SODIUM 8 MG/ML IJ SOLN
INTRAMUSCULAR | Status: DC | PRN
  Administered 2012-04-17: 5 mL via INTRAVENOUS

## 2012-04-17 MED ORDER — LIDOCAINE HCL (CARDIAC) 20 MG/ML IV SOLN
INTRAVENOUS | Status: DC | PRN
  Administered 2012-04-17: 50 mg via INTRAVENOUS

## 2012-04-17 MED ORDER — OXYCODONE-ACETAMINOPHEN 10-650 MG PO TABS: 1.0000 | ORAL_TABLET | Freq: Four times a day (QID) | ORAL | Status: AC | PRN

## 2012-04-17 MED ORDER — GLYCOPYRROLATE 0.2 MG/ML IJ SOLN
INTRAMUSCULAR | Status: DC | PRN
  Administered 2012-04-17: 0.6 mg via INTRAVENOUS

## 2012-04-17 MED ORDER — LIDOCAINE-EPINEPHRINE (PF) 1 %-1:200000 IJ SOLN
INTRAMUSCULAR | Status: DC | PRN
  Administered 2012-04-17: 4 mL

## 2012-04-17 SURGICAL SUPPLY — 65 items
ADH SKN CLS APL DERMABOND .7 (GAUZE/BANDAGES/DRESSINGS) ×2
BAG DRAIN URO-CYSTO SKYTR STRL (DRAIN) ×3 IMPLANT
BAG DRN UROCATH (DRAIN) ×2
BAG URINE DRAINAGE (UROLOGICAL SUPPLIES) IMPLANT
BLADE HEX COATED 2.75 (ELECTRODE) ×3 IMPLANT
BLADE SURG 10 STRL SS (BLADE) ×3 IMPLANT
BLADE SURG 15 STRL LF DISP TIS (BLADE) ×2 IMPLANT
BLADE SURG 15 STRL SS (BLADE) ×3
BLADE SURG ROTATE 9660 (MISCELLANEOUS) ×3 IMPLANT
CANISTER SUCT LVC 12 LTR MEDI- (MISCELLANEOUS) ×1 IMPLANT
CANISTER SUCTION 1200CC (MISCELLANEOUS) ×3 IMPLANT
CATH FOLEY 2WAY SLVR  5CC 16FR (CATHETERS) ×1
CATH FOLEY 2WAY SLVR  5CC 18FR (CATHETERS)
CATH FOLEY 2WAY SLVR 5CC 16FR (CATHETERS) ×2 IMPLANT
CATH FOLEY 2WAY SLVR 5CC 18FR (CATHETERS) IMPLANT
CATH ROBINSON RED A/P 12FR (CATHETERS) IMPLANT
CATH ROBINSON RED A/P 14FR (CATHETERS) IMPLANT
CLOTH BEACON ORANGE TIMEOUT ST (SAFETY) ×3 IMPLANT
COVER MAYO STAND STRL (DRAPES) ×6 IMPLANT
COVER TABLE BACK 60X90 (DRAPES) ×3 IMPLANT
DERMABOND ADVANCED (GAUZE/BANDAGES/DRESSINGS) ×1
DERMABOND ADVANCED .7 DNX12 (GAUZE/BANDAGES/DRESSINGS) ×2 IMPLANT
DRAIN PENROSE 18X1/4 LTX STRL (WOUND CARE) IMPLANT
DRAPE CAMERA CLOSED 9X96 (DRAPES) ×3 IMPLANT
DRAPE UNDERBUTTOCKS STRL (DRAPE) ×3 IMPLANT
ELECT REM PT RETURN 9FT ADLT (ELECTROSURGICAL) ×3
ELECTRODE REM PT RTRN 9FT ADLT (ELECTROSURGICAL) ×2 IMPLANT
GAUZE SPONGE 4X4 16PLY XRAY LF (GAUZE/BANDAGES/DRESSINGS) IMPLANT
GLOVE BIO SURGEON STRL SZ7.5 (GLOVE) ×6 IMPLANT
GLOVE BIOGEL M 6.5 STRL (GLOVE) ×1 IMPLANT
GLOVE ECLIPSE 6.0 STRL STRAW (GLOVE) ×1 IMPLANT
GOWN STRL NON-REIN LRG LVL3 (GOWN DISPOSABLE) ×6 IMPLANT
GOWN STRL REIN XL XLG (GOWN DISPOSABLE) ×3 IMPLANT
HOLDER FOLEY CATH W/STRAP (MISCELLANEOUS) ×3 IMPLANT
HOOK RETRACTION 12 ELAST STAY (MISCELLANEOUS) IMPLANT
NDL SAFETY ECLIPSE 18X1.5 (NEEDLE) ×2 IMPLANT
NEEDLE HYPO 18GX1.5 SHARP (NEEDLE) ×3
NEEDLE HYPO 22GX1.5 SAFETY (NEEDLE) ×3 IMPLANT
NS IRRIG 500ML POUR BTL (IV SOLUTION) IMPLANT
PACK BASIN DAY SURGERY FS (CUSTOM PROCEDURE TRAY) ×3 IMPLANT
PACK CYSTOSCOPY (CUSTOM PROCEDURE TRAY) ×3 IMPLANT
PACKING VAGINAL (PACKING) ×3 IMPLANT
PENCIL BUTTON HOLSTER BLD 10FT (ELECTRODE) ×3 IMPLANT
PLUG CATH AND CAP STER (CATHETERS) ×3 IMPLANT
RETRACTOR STERILE 25.8CMX11.3 (INSTRUMENTS) IMPLANT
SET IRRIG Y TYPE TUR BLADDER L (SET/KITS/TRAYS/PACK) ×3 IMPLANT
SHEET LAVH (DRAPES) ×3 IMPLANT
SURGILUBE 2OZ TUBE FLIPTOP (MISCELLANEOUS) ×3 IMPLANT
SUT SILK 0 TIES 10X30 (SUTURE) IMPLANT
SUT SILK 2 0 30  PSL (SUTURE)
SUT SILK 2 0 30 PSL (SUTURE) IMPLANT
SUT VIC AB 2-0 CT1 27 (SUTURE) ×6
SUT VIC AB 2-0 CT1 TAPERPNT 27 (SUTURE) ×4 IMPLANT
SUT VICRYL 4-0 PS2 18IN ABS (SUTURE) ×3 IMPLANT
SYR 20CC LL (SYRINGE) ×3 IMPLANT
SYR BULB IRRIGATION 50ML (SYRINGE) ×3 IMPLANT
SYR CONTROL 10ML LL (SYRINGE) ×3 IMPLANT
SYRINGE 10CC LL (SYRINGE) ×3 IMPLANT
TOWEL OR 17X24 6PK STRL BLUE (TOWEL DISPOSABLE) ×3 IMPLANT
TRAY DSU PREP LF (CUSTOM PROCEDURE TRAY) ×3 IMPLANT
TUBE CONNECTING 12X1/4 (SUCTIONS) ×6 IMPLANT
WATER STERILE IRR 3000ML UROMA (IV SOLUTION) ×3 IMPLANT
WATER STERILE IRR 500ML POUR (IV SOLUTION) IMPLANT
YANKAUER SUCT BULB TIP NO VENT (SUCTIONS) ×3 IMPLANT
sparc sling system ×1 IMPLANT

## 2012-04-17 NOTE — Anesthesia Postprocedure Evaluation (Signed)
Anesthesia Post Note  Patient: Brenda Ware  Procedure(s) Performed: Procedure(s) (LRB): CYSTO (N/A) SPARC PROCEDURE (N/A)  Anesthesia type: General  Patient location: PACU  Post pain: Pain level controlled  Post assessment: Post-op Vital signs reviewed  Last Vitals:  Filed Vitals:   04/17/12 1259  BP: 102/68  Pulse: 88  Temp: 36.3 C  Resp: 18    Post vital signs: Reviewed  Level of consciousness: sedated  Complications: No apparent anesthesia complications

## 2012-04-17 NOTE — Op Note (Signed)
Preoperative diagnosis: Stress urinary incontinence  Postoperative diagnosis: Stress urinary incontinence  Procedure: Sling cystourethropexy [facility])  Surgeon: Dr. Alfredo Martinez  Estimated blood loss: <30 milliliters  Specimens: None  The patient was prepped and draped in the usual fashion. Extra care was taken with leg positioning to minimize the  risk of compartment syndrome, neuropathy, and deep vein thrombosis. Preoperative laboratory tests were normal and preoperative antibiotics were given.  Two less than 1 cm incisions were made 1 fingerbreadth above the symphysis pubis 1.5 cm lateral to the midline. A 2 cm appropriate depth suburethral incision was made underneath the mid urethra after instilling approximately 5 cc of 1% lidocaine mixture. I sharply and bluntly dissected to the urethral vesical angle bilaterally.  With the bladder empty I passed a SPARC needle on top of and along the back of the symphysis pubis staying  on the periosteum and staying lateral using my box technique and delivering the needle onto the pulp of my  index finger bilaterally.  I cystoscoped the patient thoroughly and there was no injury to the bladder or urethra. There was no movement  or indentation of the bladder with movement of the trocar. There was excellent efflux of blue urine bilaterally.  With the bladder emptied I attached the [facility] sling and brought it up through the retropubic space bilaterally. I tensioned it over the fat part of a moderate size Kelly clamp. I cut below the blue dots, irrigated the sheaths, and removed the sheaths. I was very happy with the position and tension of the sling With appropriate hypermobility and no springback effect.  All incisions were irrigated. The sling was cut below the skin level. I closed the anterior vaginal wall with  running 2-0 Vicryl followed by my interrupted sutures. Interrupted 4-0 Vicryl was used for the abdominal incisions.  A catheter  plug was used with the Foley catheter as well as a vaginal pack.  The patient was taken to the recovery room and hopefully this procedure will reach her treatment goal.

## 2012-04-17 NOTE — Interval H&P Note (Signed)
History and Physical Interval Note:  04/17/2012 7:36 AM  Brenda Ware  has presented today for surgery, with the diagnosis of stress incontinence  The various methods of treatment have been discussed with the patient and family. After consideration of risks, benefits and other options for treatment, the patient has consented to  Procedure(s) (LRB): CYSTO (N/A) SPARC PROCEDURE (N/A) as a surgical intervention .  The patient's history has been reviewed, patient examined, no change in status, stable for surgery.  I have reviewed the patient's chart and labs.  Questions were answered to the patient's satisfaction.     Jahkari Maclin A

## 2012-04-17 NOTE — Anesthesia Procedure Notes (Signed)
Procedure Name: Intubation Date/Time: 04/17/2012 10:16 AM Performed by: Norva Pavlov Pre-anesthesia Checklist: Patient identified, Emergency Drugs available, Suction available and Patient being monitored Patient Re-evaluated:Patient Re-evaluated prior to inductionOxygen Delivery Method: Circle System Utilized Preoxygenation: Pre-oxygenation with 100% oxygen Intubation Type: IV induction Ventilation: Mask ventilation without difficulty Laryngoscope Size: Mac and 3 Grade View: Grade I Tube type: Oral Tube size: 7.0 mm Number of attempts: 1 Airway Equipment and Method: stylet and oral airway Placement Confirmation: ETT inserted through vocal cords under direct vision,  positive ETCO2 and breath sounds checked- equal and bilateral Secured at: 21 cm Tube secured with: Tape Dental Injury: Teeth and Oropharynx as per pre-operative assessment

## 2012-04-17 NOTE — Anesthesia Preprocedure Evaluation (Signed)
Anesthesia Evaluation  Patient identified by MRN, date of birth, ID band Patient awake    Reviewed: Allergy & Precautions, H&P , NPO status , Patient's Chart, lab work & pertinent test results  Airway Mallampati: III TM Distance: <3 FB Neck ROM: Full    Dental  (+) Teeth Intact and Caps,    Pulmonary Current Smoker (2 ppd),  breath sounds clear to auscultation        Cardiovascular hypertension, Rhythm:Regular Rate:Normal     Neuro/Psych negative neurological ROS  negative psych ROS   GI/Hepatic Neg liver ROS, GERD-  ,  Endo/Other  Morbid obesity  Renal/GU negative Renal ROS  negative genitourinary   Musculoskeletal negative musculoskeletal ROS (+)   Abdominal (+) + obese,   Peds  Hematology negative hematology ROS (+)   Anesthesia Other Findings   Reproductive/Obstetrics negative OB ROS                           Anesthesia Physical Anesthesia Plan  ASA: II  Anesthesia Plan: General   Post-op Pain Management:    Induction: Intravenous  Airway Management Planned: Oral ETT  Additional Equipment:   Intra-op Plan:   Post-operative Plan: Extubation in OR  Informed Consent: I have reviewed the patients History and Physical, chart, labs and discussed the procedure including the risks, benefits and alternatives for the proposed anesthesia with the patient or authorized representative who has indicated his/her understanding and acceptance.   Dental advisory given  Plan Discussed with: CRNA  Anesthesia Plan Comments:         Anesthesia Quick Evaluation

## 2012-04-17 NOTE — Transfer of Care (Signed)
Immediate Anesthesia Transfer of Care Note  Patient: Brenda Ware  Procedure(s) Performed: Procedure(s) (LRB): CYSTO (N/A) SPARC PROCEDURE (N/A)  Patient Location: PACU  Anesthesia Type: General  Level of Consciousness: drowsy, arouses to name, follows commands  Airway & Oxygen Therapy: Patient Spontanous Breathing and Patient connected to face mask oxygen  Post-op Assessment: Report given to PACU RN and Post -op Vital signs reviewed and stable  Post vital signs: Reviewed and stable  Complications: No apparent anesthesia complications

## 2012-04-18 ENCOUNTER — Encounter (HOSPITAL_BASED_OUTPATIENT_CLINIC_OR_DEPARTMENT_OTHER): Payer: Self-pay | Admitting: Urology

## 2013-01-29 ENCOUNTER — Other Ambulatory Visit: Payer: Self-pay

## 2013-01-29 DIAGNOSIS — Z1231 Encounter for screening mammogram for malignant neoplasm of breast: Secondary | ICD-10-CM

## 2013-02-05 ENCOUNTER — Encounter: Payer: Self-pay | Admitting: Family Medicine

## 2013-02-05 ENCOUNTER — Ambulatory Visit (INDEPENDENT_AMBULATORY_CARE_PROVIDER_SITE_OTHER): Payer: BC Managed Care – PPO | Admitting: Family Medicine

## 2013-02-05 VITALS — BP 121/64 | HR 76 | Ht 65.0 in | Wt 270.0 lb

## 2013-02-05 DIAGNOSIS — I10 Essential (primary) hypertension: Secondary | ICD-10-CM

## 2013-02-05 DIAGNOSIS — Z0000 Encounter for general adult medical examination without abnormal findings: Secondary | ICD-10-CM

## 2013-02-05 DIAGNOSIS — E669 Obesity, unspecified: Secondary | ICD-10-CM

## 2013-02-05 NOTE — Patient Instructions (Addendum)
It was good to meet you today Ms Shea Evans.  I will fax your bariatric surgery paperwork to the office.  For your pap smear, next one will be due next year.  For your blood pressure history, I am glad it is now normal. We will continue to monitor that.  I would like to check some labwork (metabolic panel and cholesterol). Please schedule a time when you can come in *fasting* for a lab-only appointment.  If you develop any concerning symptoms like chest pain or shortness of breath, seek immediate medical attention.  Take care. Dr. Benjamin Stain

## 2013-02-05 NOTE — Progress Notes (Signed)
Subjective:     Patient ID: Brenda Ware, female   DOB: [birth year], 47 y.o.   MRN: [id number]  CC - Physical  HPI - Brenda Ware is a 47 y.o. female with h/o HTN, HLD, obesity, and abnormal pap smear here for physical.  She does not want a pap smear, as she had one in 2012 that was WNL and states her abnormal pap was very long ago. She would like to lose weight and has consulted with [facility] about bariatric surgery. She would like some paperwork filled out.  She was diagnosed with HTN years ago and had stopped being hypertensive after losing 50 lbs, all of which she has gained back but has not had a problem with BP in years.   She has stopped smoking cigarettes 12/05/11.    Review of Systems - Per HPI with other systems reviewed and negative.  Past Medical History  Diagnosis Date  . SUI (stress urinary incontinence, female)   . GERD (gastroesophageal reflux disease)   . Seasonal allergies   . History of LEEP (loop electrosurgical excision procedure) of cervix complicating pregnancy HIX CIN III  S/P LEEP IN MD OFFICE   SH: Quit smoking 12/04/12 and switched to e cigarettes     Objective:   Physical Exam BP 121/64  Pulse 76  Ht 5\' 5"  (1.651 m)  Wt 270 lb (122.471 kg)  BMI 44.93 kg/m2 GEN: NAD, pleasant, talkative PULM: Normal effort ABD: Obese, nondistended NEURO: Normal speech and gait, no focal deficits, alert    Assessment:     Brenda Ware is a 47 y.o. female with h/o HTN, HLD, obesity, and abnormal pap smear here for physical.    Plan:     # See problem list for problem-based plans.  # Health Maintenance - Lab only fasting lipid panel and BMET ordered and recommended to pt - Pap smear in 2015 - Quit smoking and does not want quit line number - Declines STD testing - Tetanus shot 6 years ago per pt - Plans mammogram next month and they have never been abnormal per pt - Colonoscopy due in 3 years

## 2013-02-06 DIAGNOSIS — Z0000 Encounter for general adult medical examination without abnormal findings: Secondary | ICD-10-CM | POA: Insufficient documentation

## 2013-02-06 NOTE — Assessment & Plan Note (Signed)
Will out paperwork from [facility] and fax to them. - Fasting lipid panel and BMET lab-only appointment recommended and labs ordered

## 2013-02-07 ENCOUNTER — Encounter: Payer: Self-pay | Admitting: Family Medicine

## 2013-02-07 ENCOUNTER — Telehealth: Payer: Self-pay | Admitting: Family Medicine

## 2013-02-07 DIAGNOSIS — Z87891 Personal history of nicotine dependence: Secondary | ICD-10-CM | POA: Insufficient documentation

## 2013-02-07 NOTE — Telephone Encounter (Signed)
Left message on voice mail for patient to call back. When she calls, please let her know that I am working on her form for [facility] for bariatric surgery but because I am not her PCP, it will not be as in-depth as they may need. Before I can fully fill it out, I need information about her weight loss attempts, including dates, results, medications, diet, and exercise modifications. We can either have me fill it out once she provides this information, or she can set up an appointment for this when Dr. Gwenlyn Saran returns in June.  Thank you.

## 2013-02-08 NOTE — Telephone Encounter (Signed)
Called patient again and got voicemail. If pt calls back, please let her know that due to being unable to reach patient, I will unfortunately not be able to complete this form for her as some questions require information I would need to get from her. Will place in Dr. Whitney Muse box to fill out on her return. Simone Curia 02/08/2013 7:54 PM

## 2013-02-13 ENCOUNTER — Other Ambulatory Visit (INDEPENDENT_AMBULATORY_CARE_PROVIDER_SITE_OTHER): Payer: Self-pay | Admitting: General Surgery

## 2013-02-13 ENCOUNTER — Encounter (INDEPENDENT_AMBULATORY_CARE_PROVIDER_SITE_OTHER): Payer: Self-pay | Admitting: General Surgery

## 2013-02-13 ENCOUNTER — Ambulatory Visit (INDEPENDENT_AMBULATORY_CARE_PROVIDER_SITE_OTHER): Payer: BC Managed Care – PPO | Admitting: General Surgery

## 2013-02-13 VITALS — BP 117/78 | HR 72 | Temp 97.5°F | Resp 18 | Ht 65.0 in | Wt 274.5 lb

## 2013-02-13 DIAGNOSIS — M2550 Pain in unspecified joint: Secondary | ICD-10-CM

## 2013-02-13 DIAGNOSIS — I10 Essential (primary) hypertension: Secondary | ICD-10-CM

## 2013-02-13 DIAGNOSIS — E669 Obesity, unspecified: Secondary | ICD-10-CM

## 2013-02-13 DIAGNOSIS — K2100 Gastro-esophageal reflux disease with esophagitis, without bleeding: Secondary | ICD-10-CM

## 2013-02-13 DIAGNOSIS — Z6841 Body Mass Index (BMI) 40.0 and over, adult: Secondary | ICD-10-CM

## 2013-02-13 NOTE — Patient Instructions (Signed)
Keep up the excellent work with not smoking Please call if you have any questions during the process

## 2013-02-13 NOTE — Progress Notes (Signed)
Patient ID: Brenda Ware, female   DOB: 1966/02/20, 47 y.o.   MRN: [id number]  Chief Complaint  Patient presents with  . Weight Loss Surgery    HPI Brenda Ware is a 47 y.o. female.   HPI 47 yo WF referred by Dr Benjamin Stain for evaluation of weight loss surgery. The patient was  Initially interested in laparoscopic adjustable gastric band surgery but is now leaning toward a sleeve gastrectomy. The sleeve gastrectomy appeals to her because it does not involve any rerouting of intestines and the weight loss is generally higher with that procedure then the laparoscopic adjustable gastric band.  She states that she is struggling with her weight most of her adult life. She has tried numerous diets without any long-term success. She has tried the [employer], Weight Watchers, Nutrisystem as well as an oral medication which she cannot recall. She was most successful with Atkin's diet which he lost 55 pounds with that ultimately regained all the weight. 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 SUI (stress urinary incontinence, female)   . GERD (gastroesophageal reflux disease)   . Seasonal allergies   . History of LEEP (loop electrosurgical excision procedure) of cervix complicating pregnancy HIX CIN III  S/P LEEP IN MD OFFICE    Past Surgical History  Procedure Laterality Date  . Tubal ligation  1991  . Cholecystectomy  1991  . Bladder suspension  04/17/2012    Procedure: SPARC PROCEDURE;  Surgeon: Martina Sinner, MD;  Location: [facility];  Service: Urology;  Laterality: N/A;    No family history on file.  Social History History  Substance Use Topics  . Smoking status: Former Smoker -- 1.00 packs/day for 10 years    Types: Cigarettes    Quit date: 12/04/2012  . Smokeless tobacco: Never Used  . Alcohol Use: No    Allergies  Allergen Reactions  . Avelox  (Moxifloxacin Hcl In Nacl) Other (See Comments)    Causes throat and tongue to swell  . Doxycycline Other (See Comments)    GI UPSET  . Vicodin (Hydrocodone-Acetaminophen) Other (See Comments)    GI UPSET    Current Outpatient Prescriptions  Medication Sig Dispense Refill  . fexofenadine (ALLEGRA) 180 MG tablet Take 180 mg by mouth daily.      . ranitidine (ZANTAC) 150 MG tablet Take 150 mg by mouth 2 (two) times daily.       No current facility-administered medications for this visit.    Review of Systems Review of Systems  Constitutional: Negative for fever, chills and unexpected weight change.       Getting a mammogram in June  HENT: Negative for hearing loss, congestion, sore throat, trouble swallowing and voice change.   Eyes: Negative for visual disturbance.  Respiratory: Negative for cough, shortness of breath and wheezing.   Cardiovascular: Negative for chest pain, palpitations and leg swelling.       Denies orthopnea, paroxysmal nocturnal dyspnea or dyspnea on exertion  Gastrointestinal: Negative for nausea, vomiting, diarrhea, constipation, blood in stool, abdominal distention and anal bleeding.       Has some reflux on occasion, significantly improved once started taking antireflux medication; history of laparoscopic cholecystectomy  Genitourinary: Negative for hematuria, vaginal bleeding and difficulty urinating.       G2 P2; irregular menstrual periods thinks that she is getting near menopause. No  pain with menstruation. History of stress urinary incontinence. She underwent a sling procedure still has some occasional problems with incontinence-dribbles after urinating  Musculoskeletal: Negative for arthralgias.       Has left hip pain and right knee pain. She was recently placed on a prednisone taper for right knee pain.  Skin: Negative for rash and wound.  Neurological: Negative for seizures, syncope and headaches.       No amaurosis fugax or TIAs  Hematological:  Negative for adenopathy. Does not bruise/bleed easily.  Psychiatric/Behavioral: Negative for confusion.    Blood pressure 117/78, pulse 72, temperature 97.5 F (36.4 C), temperature source Oral, resp. rate 18, height 5\' 5"  (1.651 m), weight 274 lb 8 oz (124.512 kg).  Physical Exam Physical Exam  Vitals reviewed. Constitutional: She is oriented to person, place, and time. She appears well-developed and well-nourished. No distress.  HENT:  Head: Normocephalic and atraumatic.  Right Ear: External ear normal.  Left Ear: External ear normal.  Eyes: Conjunctivae are normal. No scleral icterus.  Neck: Normal range of motion. Neck supple. No tracheal deviation present. No thyromegaly present.  Cardiovascular: Normal rate, regular rhythm, normal heart sounds and intact distal pulses.   Pulmonary/Chest: Effort normal and breath sounds normal. No respiratory distress. She has no wheezes.  Abdominal: Soft. She exhibits no distension. There is no tenderness. There is no rebound and no guarding.  Well-healed trocar incisions  Musculoskeletal: Normal range of motion. She exhibits no edema and no tenderness.  Lymphadenopathy:    She has no cervical adenopathy.  Neurological: She is alert and oriented to person, place, and time. She exhibits normal muscle tone.  Skin: Skin is warm and dry. No rash noted. She is not diaphoretic. No erythema. No pallor.  Psychiatric: She has a normal mood and affect. Her behavior is normal. Judgment and thought content normal.    Data Reviewed Dr Benjamin Stain office note 02/05/13 Dr Sherron Monday operative note  Assessment    Morbid obesity Stress urinary incontinence Joint Pain H/o HTN GERD  h/o dislipidemia    Plan    The patient meets weight loss surgery criteria. I think the patient would be an acceptable candidate for Laparoscopic vertical sleeve gastrectomy.   We discussed laparoscopic vertical sleeve gastrectomy. We discussed the preoperative,  operative and postoperative process. Using diagrams, I explained the surgery in detail including the performance of an EGD near the end of the surgery and an Upper GI swallow study on POD 1. We discussed the typical hospital course including a 2-3 day stay baring any complications.   The patient was given educational material. I quoted the patient that they can expect to lose 50-70% of their excess weight. We did discuss the possibility of weight regain several years after the procedure.  We discussed the risk and benefits of surgery including but not limited to anesthesia risk, bleeding, infection, postop nausea, blood clot formation, staple line leak,  stricture, ulcer formation, death, respiratory complications, intestinal blockage,incisional hernia, worsening reflux, vitamin and nutritional deficiencies, injury to surrounding structures, failure to lose weight and mood changes.  We discussed that before and after surgery that there would be an alteration in their diet. I explained that we have put them on a diet 2 weeks before surgery. I also explained that they would be on a liquid diet for 2 weeks after surgery. We discussed that they would have to avoid certain foods such as sugar after surgery. We discussed the importance of physical activity as well as compliance  with our dietary and supplement recommendations and routine follow-up.  I explained to the patient that we will start our evaluation process which includes labs, Upper GI to evaluate stomach and swallowing anatomy, nutritionist consultation, psychiatrist consultation, EKG, urine nicotine, CXR.  Mary Sella. Andrey Campanile, MD, FACS General, Bariatric, & Minimally Invasive Surgery [facility], [place]            [facility] M 02/13/2013, 1:08 PM

## 2013-03-06 ENCOUNTER — Encounter: Payer: BC Managed Care – PPO | Attending: Surgery | Admitting: *Deleted

## 2013-03-06 ENCOUNTER — Encounter: Payer: Self-pay | Admitting: *Deleted

## 2013-03-06 VITALS — Ht 65.0 in | Wt 275.7 lb

## 2013-03-06 DIAGNOSIS — Z713 Dietary counseling and surveillance: Secondary | ICD-10-CM | POA: Insufficient documentation

## 2013-03-06 DIAGNOSIS — E669 Obesity, unspecified: Secondary | ICD-10-CM

## 2013-03-06 DIAGNOSIS — Z01818 Encounter for other preprocedural examination: Secondary | ICD-10-CM | POA: Insufficient documentation

## 2013-03-06 NOTE — Patient Instructions (Addendum)
   Follow Pre-Op Nutrition Goals to prepare for Sleeve Gastrectomy Surgery.   Call the [facility] at [phone] once you have been given your surgery date to enrolled in the Pre-Op Nutrition Class. You will need to attend this nutrition class 3-4 weeks prior to your surgery.  

## 2013-03-06 NOTE — Progress Notes (Signed)
  Pre-Op Assessment Visit:  Pre-Operative Sleeve Gastrectomy Surgery  Medical Nutrition Therapy:  Appt start time: 1200   End time:  1300.  Patient was seen on 03/06/2013 for Pre-Operative Sleeve Gastrectomy Nutrition Assessment. Assessment and letter of approval faxed to [facility] Bariatric Surgery Program coordinator on 03/06/2013.  Approval letter sent to [facility] and will be available in the chart under the media tab.  Handouts given during visit include:  Pre-Op Goals   Bariatric Surgery Protein Shakes  Samples given during visit include:   Premier Protein Shake: 1 ea Lot: [id number];  Exp: 10/08/13  Patient to call for Pre-Op and Post-Op Nutrition Education at the [facility] when surgery is scheduled.

## 2013-03-07 ENCOUNTER — Ambulatory Visit
Admission: RE | Admit: 2013-03-07 | Discharge: 2013-03-07 | Disposition: A | Discharge status: Home / Self Care | Payer: BC Managed Care – PPO | Source: Ambulatory Visit

## 2013-03-07 DIAGNOSIS — Z1231 Encounter for screening mammogram for malignant neoplasm of breast: Secondary | ICD-10-CM

## 2013-03-20 ENCOUNTER — Other Ambulatory Visit: Payer: Self-pay

## 2013-03-20 ENCOUNTER — Ambulatory Visit (HOSPITAL_COMMUNITY)
Admission: RE | Admit: 2013-03-20 | Discharge: 2013-03-20 | Disposition: A | Discharge status: Home / Self Care | Payer: BC Managed Care – PPO | Source: Ambulatory Visit | Attending: General Surgery | Admitting: General Surgery

## 2013-03-20 DIAGNOSIS — K449 Diaphragmatic hernia without obstruction or gangrene: Secondary | ICD-10-CM | POA: Insufficient documentation

## 2013-03-20 DIAGNOSIS — R059 Cough, unspecified: Secondary | ICD-10-CM | POA: Insufficient documentation

## 2013-03-20 DIAGNOSIS — M2550 Pain in unspecified joint: Secondary | ICD-10-CM | POA: Insufficient documentation

## 2013-03-20 DIAGNOSIS — N393 Stress incontinence (female) (male): Secondary | ICD-10-CM | POA: Insufficient documentation

## 2013-03-20 DIAGNOSIS — I10 Essential (primary) hypertension: Secondary | ICD-10-CM | POA: Insufficient documentation

## 2013-03-20 DIAGNOSIS — Z87891 Personal history of nicotine dependence: Secondary | ICD-10-CM | POA: Insufficient documentation

## 2013-03-20 DIAGNOSIS — R05 Cough: Secondary | ICD-10-CM | POA: Insufficient documentation

## 2013-03-20 DIAGNOSIS — K219 Gastro-esophageal reflux disease without esophagitis: Secondary | ICD-10-CM | POA: Insufficient documentation

## 2013-03-20 DIAGNOSIS — R0989 Other specified symptoms and signs involving the circulatory and respiratory systems: Secondary | ICD-10-CM | POA: Insufficient documentation

## 2013-04-04 ENCOUNTER — Encounter: Payer: BC Managed Care – PPO | Attending: General Surgery | Admitting: *Deleted

## 2013-04-04 ENCOUNTER — Encounter: Payer: Self-pay | Admitting: *Deleted

## 2013-04-04 VITALS — Ht 65.0 in | Wt 278.5 lb

## 2013-04-04 DIAGNOSIS — E669 Obesity, unspecified: Secondary | ICD-10-CM

## 2013-04-04 DIAGNOSIS — Z01818 Encounter for other preprocedural examination: Secondary | ICD-10-CM | POA: Insufficient documentation

## 2013-04-04 DIAGNOSIS — Z713 Dietary counseling and surveillance: Secondary | ICD-10-CM | POA: Insufficient documentation

## 2013-04-04 NOTE — Progress Notes (Addendum)
Bariatric Class:  Appt start time: 1730  End time:  1830.  Pre-Operative Nutrition Class  Patient was seen on 04/04/2013 for Pre-Operative Bariatric Surgery Education at the [facility].   Surgery date: TBD Surgery type: Sleeve Gastrectomy Start weight at [facility]: 275.7 lbs (03/06/13)  Weight today: 278.5 lbs Weight change: + 2.8 lbs Total weight lost: n/a  TANITA  BODY COMP RESULTS  04/04/13   BMI (kg/m^2) 46.3   Fat Mass (lbs) 145.5   Fat Free Mass (lbs) 133.0   Total Body Water (lbs) 97.5   Samples given per MNT protocol; Patient educated on appropriate usage: Bariatric Advantage Multivitamin Lot: [id number] Exp: 06/15  Bariatric Advantage Calcium Citrate Lot: [id number] Exp: 10/15  Celebrate Vitamins Multivitamin Lot: [id number] Exp: 01/15  Celebrate Vitamins Sublingual B12 Lot: [id number] Exp: 11/15  Unjury Protein Powder Lot: [id number] Exp: 07/15  The following the learning objective met by the patient during this course:  Identify Pre-Op Dietary Goals and will begin 2 weeks pre-operatively  Identify appropriate sources of fluids and proteins   State protein recommendations and appropriate sources pre and post-operatively  Identify Post-Operative Dietary Goals and will follow for 2 weeks post-operatively  Identify appropriate multivitamin and calcium sources  Describe the need for physical activity post-operatively and will follow MD recommendations  State when to call healthcare provider regarding medication questions or post-operative complications  Handouts given during class include:  Pre-Op Bariatric Surgery Diet Handout  Protein Shake Handout  Post-Op Bariatric Surgery Nutrition Handout  BELT Program Information Flyer  Support Group Information Flyer  [facility] Bariatric Supplements Price List  Follow-Up Plan: Patient will follow-up at [facility] 2 weeks post operatively for diet advancement per MD.

## 2013-04-04 NOTE — Patient Instructions (Signed)
Follow:   Pre-Op Diet per MD 2 weeks prior to surgery  Phase 2- Liquids (clear/full) 2 weeks after surgery  Vitamin/Mineral/Calcium guidelines for purchasing bariatric supplements  Exercise guidelines pre and post-op per MD  Follow-up at [facility] in 2 weeks post-op for diet advancement. Contact Francesca Strome as needed with questions/concerns. 

## 2013-04-05 LAB — CBC
HCT: 39.2 % (ref 36.0–46.0)
Hemoglobin: 13 g/dL (ref 12.0–15.0)
MCH: 28.3 pg (ref 26.0–34.0)
MCHC: 33.2 g/dL (ref 30.0–36.0)
RDW: 13.4 % (ref 11.5–15.5)

## 2013-04-05 LAB — T4: T4, Total: 6.6 ug/dL (ref 5.0–12.5)

## 2013-04-05 LAB — LIPID PANEL
HDL: 35 mg/dL — ABNORMAL LOW (ref >39)
Total CHOL/HDL Ratio: 5 Ratio
Triglycerides: 161 mg/dL — ABNORMAL HIGH (ref <150)

## 2013-04-05 LAB — COMPREHENSIVE METABOLIC PANEL
AST: 18 U/L (ref 0–37)
Albumin: 3.8 g/dL (ref 3.5–5.2)
Alkaline Phosphatase: 46 U/L (ref 39–117)
BUN: 15 mg/dL (ref 6–23)
Glucose, Bld: 88 mg/dL (ref 70–99)
Potassium: 4.1 mEq/L (ref 3.5–5.3)
Sodium: 135 mEq/L (ref 135–145)
Total Bilirubin: 0.2 mg/dL — ABNORMAL LOW (ref 0.3–1.2)

## 2013-04-05 LAB — H. PYLORI ANTIBODY, IGG: H Pylori IgG: 0.46 {ISR}

## 2013-05-02 ENCOUNTER — Encounter: Payer: Self-pay | Admitting: Dietician

## 2013-05-02 ENCOUNTER — Encounter: Payer: BC Managed Care – PPO | Attending: General Surgery | Admitting: Dietician

## 2013-05-02 VITALS — Ht 65.0 in | Wt 275.3 lb

## 2013-05-02 DIAGNOSIS — E669 Obesity, unspecified: Secondary | ICD-10-CM

## 2013-05-02 DIAGNOSIS — Z713 Dietary counseling and surveillance: Secondary | ICD-10-CM | POA: Insufficient documentation

## 2013-05-02 DIAGNOSIS — Z01818 Encounter for other preprocedural examination: Secondary | ICD-10-CM | POA: Insufficient documentation

## 2013-05-02 NOTE — Progress Notes (Signed)
  6 Months Supervised Weight Loss Visit: Pre-Operative Sleeve Gastrectomy Surgery  Medical Nutrition Therapy:  Appt start time: 1200 end time:  1230.  Assessment:  Primary concerns today: pre-operative bariatric surgery nutrition management. Enjoying time off for the summer, works as a Midwife. Plans to start physical activity more in fall. Has a treadmill at home. Has worked a little bit on chewing food more thoroughly and has not worked on drinking outside of meal. Drinking diet soda with caffeine.    Start weight at [facility]: 275.7 lbs Weight today: 275.3 lbs Weight change: none Total weight lost: none BMI: 45.9 Weight goal: 140-150 lbs  Intake: Started protein drinks for breakfast in morning, still drinking diet soda with caffeine and sweet tea. Not making many dietary changes.   Medications: none  Recent physical activity:  Not started  Progress Towards Goal(s):  In progress.   Nutritional Diagnosis:  NB-1.3 Not ready for diet/lifestyle change  As related to drinking beverages with caffeine, sugar, and carbonation.  As evidenced by patient report.    Intervention:  Discussed making a few changes each month so that she will be ready to make all the necessary diet and and exercise change for her surgery. Emphasized the importance of physical activity for weight loss and maintenance.  Monitoring/Evaluation:  Dietary intake, exercise, and body weight. Follow up in 1 month for supervised weight loss visit.

## 2013-05-02 NOTE — Patient Instructions (Addendum)
Work on drinking beverages 15 minutes before or 30 minutes after meals. Work on eliminating caffeine, sugar, and/or carbonation. Start walking once school starts.  Touch base with [facility] about supervised weight loss class.

## 2013-05-15 ENCOUNTER — Other Ambulatory Visit (INDEPENDENT_AMBULATORY_CARE_PROVIDER_SITE_OTHER): Payer: Self-pay | Admitting: General Surgery

## 2013-05-30 ENCOUNTER — Ambulatory Visit: Payer: BC Managed Care – PPO | Admitting: Dietician

## 2013-06-13 ENCOUNTER — Encounter (HOSPITAL_COMMUNITY): Payer: Self-pay | Admitting: Pharmacy Technician

## 2013-06-14 ENCOUNTER — Ambulatory Visit (INDEPENDENT_AMBULATORY_CARE_PROVIDER_SITE_OTHER): Payer: BC Managed Care – PPO | Admitting: General Surgery

## 2013-06-14 ENCOUNTER — Encounter (INDEPENDENT_AMBULATORY_CARE_PROVIDER_SITE_OTHER): Payer: Self-pay | Admitting: General Surgery

## 2013-06-14 VITALS — BP 142/68 | HR 80 | Resp 16 | Ht 65.0 in | Wt 278.0 lb

## 2013-06-14 DIAGNOSIS — Z6841 Body Mass Index (BMI) 40.0 and over, adult: Secondary | ICD-10-CM

## 2013-06-14 NOTE — Progress Notes (Signed)
Patient ID: Brenda Ware, female   DOB: 1965/11/15, 47 y.o.   MRN: [id number]  Chief Complaint  Patient presents with  . Bariatric Pre-op    HPI Brenda Ware is a 47 y.o. female.   HPI 47 year old morbidly obese Caucasian female comes in today for her preoperative appointment. She is currently scheduled to undergo laparoscopic vertical sleeve gastrectomy and possible hiatal hernia repair on September 29. She was last seen in the office on May 21. She denies any significant change to her medical history since that time. She states that she is no longer taking her reflux medication because she hasn't had any reflux problems. Her blood pressure initially in the office today was low but elevated. She is unsure why it was elevated. On recheck it was better. She states that she hasn't taken any medications for blood pressure in a very long time. She has started her preoperative diet. Past Medical History  Diagnosis Date  . SUI (stress urinary incontinence, female)   . GERD (gastroesophageal reflux disease)   . Seasonal allergies   . 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 Morbid obesity     Past Surgical History  Procedure Laterality Date  . Tubal ligation  1991  . Cholecystectomy  1991  . Bladder suspension  04/17/2012    Procedure: SPARC PROCEDURE;  Surgeon: Martina Sinner, MD;  Location: [facility];  Service: Urology;  Laterality: N/A;    History reviewed. No pertinent family history.  Social History History  Substance Use Topics  . Smoking status: Former Smoker -- 1.00 packs/day for 10 years    Types: Cigarettes    Quit date: 12/04/2012  . Smokeless tobacco: Never Used  . Alcohol Use: No    Allergies  Allergen Reactions  . Avelox [Moxifloxacin Hcl In Nacl] Other (See Comments)    Causes throat and tongue to swell  . Doxycycline Other (See Comments)    GI UPSET  .  Vicodin [Hydrocodone-Acetaminophen] Other (See Comments)    GI UPSET    No current outpatient prescriptions on file.   No current facility-administered medications for this visit.    Review of Systems Review of Systems  Constitutional: Negative for fever, chills, diaphoresis, activity change and unexpected weight change.  HENT: Negative for hearing loss, congestion, neck pain and dental problem.   Eyes: Negative for photophobia and visual disturbance.  Respiratory: Negative for chest tightness and shortness of breath.   Cardiovascular: Negative for chest pain.       Some ankle swelling at end of work day; no orthopnea, PND, DOE  Gastrointestinal: Negative for abdominal pain, diarrhea, constipation, abdominal distention and rectal pain.  Genitourinary: Negative for hematuria, flank pain, difficulty urinating and dyspareunia.  Musculoskeletal:       Joint pain  Neurological: Negative for dizziness, seizures, facial asymmetry, light-headedness, numbness and headaches.  Psychiatric/Behavioral: Negative for behavioral problems and decreased concentration.    Blood pressure 142/68, pulse 80, resp. rate 16, height 5\' 5"  (1.651 m), weight 278 lb (126.1 kg).  Physical Exam Physical Exam  Vitals reviewed. Constitutional: She is oriented to person, place, and time. She appears well-developed and well-nourished. No distress.  Morbidly obese  HENT:  Head: Normocephalic and atraumatic.  Right Ear: External ear normal.  Left Ear: External ear normal.  Eyes: Conjunctivae are normal. No scleral icterus.  Neck: Normal range of motion. Neck supple. No tracheal deviation  present. No thyromegaly present.  Cardiovascular: Normal rate and normal heart sounds.   Pulmonary/Chest: Effort normal and breath sounds normal. No stridor. No respiratory distress. She has no wheezes.  Abdominal: Soft. She exhibits no distension. There is no tenderness. There is no rebound and no guarding.  Well-healed trocar  site  Musculoskeletal: She exhibits no edema.  Lymphadenopathy:    She has no cervical adenopathy.  Neurological: She is alert and oriented to person, place, and time. She exhibits normal muscle tone.  Skin: Skin is warm and dry. No rash noted. She is not diaphoretic. No erythema.  Psychiatric: She has a normal mood and affect. Her behavior is normal. Judgment and thought content normal.    Data Reviewed My office note Labs from 02/2013 - nml cmet, cbc; TG 161, LDL 109, HDL 35 UGI- small sliding hiatal hernia  Assessment    Morbid obesity BMI 46.26 HTN Stress urinary incontinence H/o GERD Dislipidemia (no meds) Joint Pain     Plan    We reviewed her preoperative workup and discuss her upper GI results which demonstrated a small sliding hiatal hernia. Interestingly she says she no longer has reflux. I explained that we would test for her and evaluate for the hiatal hernia during surgery. If we found anything  Significant I would recommend and proceed with hiatal hernia repair at the time of her sleeve in order to decrease her chance of postoperative gastroesophageal reflux disease. We reviewed her preoperative labs and discuss her cholesterol panel. We discussed the importance of her preoperative diet. We also discussed the importance of getting some exercise next week. We reviewed the operative and postoperative steps. We also re-discussed some of the risk of surgery including leak. All of her questions were asked and answered. She is currently scheduled for laparoscopic vertical sleeve gastrectomy with possible hiatal hernia repair on September 29.  Mary Sella. Andrey Campanile, MD, FACS General, Bariatric, & Minimally Invasive Surgery [facility], [place]        [facility] M 06/14/2013, 12:18 PM

## 2013-06-14 NOTE — Patient Instructions (Signed)
Keep up with the diet Try to walk at least 3-4 times during the week Call with any questions

## 2013-06-17 ENCOUNTER — Encounter (HOSPITAL_COMMUNITY)
Admission: RE | Admit: 2013-06-17 | Discharge: 2013-06-17 | Disposition: A | Discharge status: Home / Self Care | Payer: BC Managed Care – PPO | Source: Ambulatory Visit | Attending: General Surgery | Admitting: General Surgery

## 2013-06-17 ENCOUNTER — Encounter (HOSPITAL_COMMUNITY): Payer: Self-pay

## 2013-06-17 DIAGNOSIS — N393 Stress incontinence (female) (male): Secondary | ICD-10-CM

## 2013-06-17 DIAGNOSIS — Z01818 Encounter for other preprocedural examination: Secondary | ICD-10-CM | POA: Insufficient documentation

## 2013-06-17 DIAGNOSIS — Z01812 Encounter for preprocedural laboratory examination: Secondary | ICD-10-CM | POA: Insufficient documentation

## 2013-06-17 DIAGNOSIS — K219 Gastro-esophageal reflux disease without esophagitis: Secondary | ICD-10-CM

## 2013-06-17 HISTORY — DX: Gastro-esophageal reflux disease without esophagitis: K21.9

## 2013-06-17 HISTORY — DX: Stress incontinence (female) (male): N39.3

## 2013-06-17 LAB — COMPREHENSIVE METABOLIC PANEL
ALT: 34 U/L (ref 0–35)
AST: 26 U/L (ref 0–37)
Albumin: 3.6 g/dL (ref 3.5–5.2)
Alkaline Phosphatase: 48 U/L (ref 39–117)
Calcium: 9.6 mg/dL (ref 8.4–10.5)
Glucose, Bld: 109 mg/dL — ABNORMAL HIGH (ref 70–99)
Potassium: 4.3 mEq/L (ref 3.5–5.1)
Sodium: 137 mEq/L (ref 135–145)
Total Protein: 7.4 g/dL (ref 6.0–8.3)

## 2013-06-17 LAB — CBC WITH DIFFERENTIAL/PLATELET
Basophils Absolute: 0 10*3/uL (ref 0.0–0.1)
Eosinophils Absolute: 0.1 10*3/uL (ref 0.0–0.7)
Eosinophils Relative: 1 % (ref 0–5)
Lymphs Abs: 3.1 10*3/uL (ref 0.7–4.0)
MCH: 29.7 pg (ref 26.0–34.0)
MCV: 86.2 fL (ref 78.0–100.0)
Neutrophils Relative %: 59 % (ref 43–77)
Platelets: 346 10*3/uL (ref 150–400)
RBC: 4.64 MIL/uL (ref 3.87–5.11)
RDW: 12.2 % (ref 11.5–15.5)
WBC: 8.9 10*3/uL (ref 4.0–10.5)

## 2013-06-17 NOTE — Pre-Procedure Instructions (Signed)
06-17-13 EKG/ CXR 6'14 -Epic.

## 2013-06-17 NOTE — Patient Instructions (Addendum)
20 CELESE BANNER  06/17/2013   Your procedure is scheduled on:  9-29 -2014  Report to [facility] at    0515    AM .  Call this number if you have problems the morning of surgery: [phone]  Or Presurgical Testing [phone]([place])   Remember: Follow any bowel prep instructions per MD office.    Do not eat food:After Midnight.   Take these medicines the morning of surgery with A SIP OF WATER: none.   Do not wear jewelry, make-up or nail polish.  Do not wear lotions, powders, or perfumes. You may wear deodorant.  Do not shave 12 hours prior to first CHG shower(legs and under arms).(face and neck okay.)  Do not bring valuables to the hospital.  Contacts, dentures or bridgework,body piercing,  may not be worn into surgery.  Leave suitcase in the car. After surgery it may be brought to your room.  For patients admitted to the hospital, checkout time is 11:00 AM the day of discharge.   Patients discharged the day of surgery will not be allowed to drive home. Must have responsible person with you x 24 hours once discharged.  Name and phone number of your driver: Solae Norling, spouse [phone] cell  Special Instructions: CHG(Chlorhedine 4%-"Hibiclens","Betasept","Aplicare") Shower Use Special Wash: see special instructions.(avoid face and genitals)   Please read over the following fact sheets that you were given:Incentive Spirometry Instruction.    Failure to follow these instructions may result in Cancellation of your surgery.   Patient signature_______________________________________________________

## 2013-06-23 ENCOUNTER — Encounter (HOSPITAL_COMMUNITY): Payer: Self-pay | Admitting: Anesthesiology

## 2013-06-23 NOTE — Anesthesia Preprocedure Evaluation (Addendum)
Anesthesia Evaluation  Patient identified by MRN, date of birth, ID band Patient awake    Reviewed: Allergy & Precautions, H&P , NPO status , Patient's Chart, lab work & pertinent test results  Airway Mallampati: II TM Distance: >3 FB Neck ROM: Full    Dental no notable dental hx.    Pulmonary former smoker,  Quit smoking 12-04-12 breath sounds clear to auscultation  Pulmonary exam normal       Cardiovascular hypertension, negative cardio ROS  Rhythm:Regular Rate:Normal     Neuro/Psych PSYCHIATRIC DISORDERS negative neurological ROS     GI/Hepatic negative GI ROS, Neg liver ROS,   Endo/Other  Morbid obesity  Renal/GU negative Renal ROS  negative genitourinary   Musculoskeletal negative musculoskeletal ROS (+)   Abdominal   Peds negative pediatric ROS (+)  Hematology negative hematology ROS (+)   Anesthesia Other Findings   Reproductive/Obstetrics Negative pregnancy test.                          Anesthesia Physical Anesthesia Plan  ASA: III  Anesthesia Plan: General   Post-op Pain Management:    Induction: Intravenous  Airway Management Planned:   Additional Equipment:   Intra-op Plan:   Post-operative Plan: Extubation in OR  Informed Consent: I have reviewed the patients History and Physical, chart, labs and discussed the procedure including the risks, benefits and alternatives for the proposed anesthesia with the patient or authorized representative who has indicated his/her understanding and acceptance.   Dental advisory given  Plan Discussed with: CRNA  Anesthesia Plan Comments: (Intubated 2013 without difficulty. Easy mask ventilation at that time (BMI 40 at that time))        Anesthesia Quick Evaluation

## 2013-06-24 ENCOUNTER — Encounter (HOSPITAL_COMMUNITY): Payer: Self-pay | Admitting: *Deleted

## 2013-06-24 ENCOUNTER — Encounter (HOSPITAL_COMMUNITY): Payer: Self-pay | Admitting: Anesthesiology

## 2013-06-24 ENCOUNTER — Inpatient Hospital Stay (HOSPITAL_COMMUNITY): Payer: BC Managed Care – PPO | Admitting: Anesthesiology

## 2013-06-24 ENCOUNTER — Inpatient Hospital Stay (HOSPITAL_COMMUNITY)
Admission: RE | Admit: 2013-06-24 | Discharge: 2013-06-26 | DRG: 293 | Disposition: A | Discharge status: Home / Self Care | Payer: BC Managed Care – PPO | Source: Ambulatory Visit | Attending: General Surgery | Admitting: General Surgery

## 2013-06-24 ENCOUNTER — Encounter (HOSPITAL_COMMUNITY)
Admission: RE | Disposition: A | Discharge status: Home / Self Care | Payer: Self-pay | Source: Ambulatory Visit | Attending: General Surgery

## 2013-06-24 DIAGNOSIS — Z9884 Bariatric surgery status: Secondary | ICD-10-CM

## 2013-06-24 DIAGNOSIS — I10 Essential (primary) hypertension: Secondary | ICD-10-CM

## 2013-06-24 DIAGNOSIS — K219 Gastro-esophageal reflux disease without esophagitis: Secondary | ICD-10-CM | POA: Diagnosis present

## 2013-06-24 DIAGNOSIS — Z6841 Body Mass Index (BMI) 40.0 and over, adult: Secondary | ICD-10-CM

## 2013-06-24 DIAGNOSIS — Z87891 Personal history of nicotine dependence: Secondary | ICD-10-CM

## 2013-06-24 DIAGNOSIS — N393 Stress incontinence (female) (male): Secondary | ICD-10-CM | POA: Diagnosis present

## 2013-06-24 DIAGNOSIS — K449 Diaphragmatic hernia without obstruction or gangrene: Secondary | ICD-10-CM | POA: Diagnosis present

## 2013-06-24 DIAGNOSIS — E785 Hyperlipidemia, unspecified: Secondary | ICD-10-CM | POA: Diagnosis present

## 2013-06-24 DIAGNOSIS — D62 Acute posthemorrhagic anemia: Secondary | ICD-10-CM | POA: Diagnosis not present

## 2013-06-24 HISTORY — PX: LAPAROSCOPIC GASTRIC SLEEVE RESECTION: SHX5895

## 2013-06-24 HISTORY — PX: HIATAL HERNIA REPAIR: SHX195

## 2013-06-24 HISTORY — PX: UPPER GI ENDOSCOPY: SHX6162

## 2013-06-24 LAB — HEMOGLOBIN AND HEMATOCRIT, BLOOD: HCT: 35.5 % — ABNORMAL LOW (ref 36.0–46.0)

## 2013-06-24 SURGERY — GASTRECTOMY, SLEEVE, LAPAROSCOPIC
Anesthesia: General | Site: Abdomen | Wound class: Clean Contaminated

## 2013-06-24 MED ORDER — UNJURY VANILLA POWDER: 2.0000 [oz_av] | Freq: Four times a day (QID) | ORAL | Status: DC

## 2013-06-24 MED ORDER — LACTATED RINGERS IV SOLN
INTRAVENOUS | Status: DC | PRN
  Administered 2013-06-24 (×3): via INTRAVENOUS

## 2013-06-24 MED ORDER — UNJURY CHICKEN SOUP POWDER: 2.0000 [oz_av] | Freq: Four times a day (QID) | ORAL | Status: DC

## 2013-06-24 MED ORDER — HYDROMORPHONE HCL PF 1 MG/ML IJ SOLN
INTRAMUSCULAR | Status: AC
  Filled 2013-06-24: qty 1

## 2013-06-24 MED ORDER — UNJURY CHOCOLATE CLASSIC POWDER
2.0000 [oz_av] | Freq: Four times a day (QID) | ORAL | Status: DC
  Administered 2013-06-26: 2 [oz_av] via ORAL

## 2013-06-24 MED ORDER — HYDROMORPHONE HCL PF 1 MG/ML IJ SOLN
INTRAMUSCULAR | Status: DC | PRN
  Administered 2013-06-24 (×4): 0.5 mg via INTRAVENOUS

## 2013-06-24 MED ORDER — FENTANYL CITRATE 0.05 MG/ML IJ SOLN
INTRAMUSCULAR | Status: DC | PRN
  Administered 2013-06-24: 50 ug via INTRAVENOUS
  Administered 2013-06-24: 100 ug via INTRAVENOUS
  Administered 2013-06-24: 50 ug via INTRAVENOUS
  Administered 2013-06-24: 100 ug via INTRAVENOUS
  Administered 2013-06-24: 50 ug via INTRAVENOUS
  Administered 2013-06-24: 100 ug via INTRAVENOUS
  Administered 2013-06-24: 50 ug via INTRAVENOUS

## 2013-06-24 MED ORDER — ONDANSETRON HCL 4 MG/2ML IJ SOLN
4.0000 mg | INTRAMUSCULAR | Status: DC | PRN
  Administered 2013-06-24 – 2013-06-26 (×6): 4 mg via INTRAVENOUS
  Filled 2013-06-24 (×6): qty 2

## 2013-06-24 MED ORDER — GLYCOPYRROLATE 0.2 MG/ML IJ SOLN
INTRAMUSCULAR | Status: DC | PRN
  Administered 2013-06-24: .6 mg via INTRAVENOUS

## 2013-06-24 MED ORDER — KCL IN DEXTROSE-NACL 20-5-0.45 MEQ/L-%-% IV SOLN
INTRAVENOUS | Status: DC
  Administered 2013-06-24 – 2013-06-25 (×4): via INTRAVENOUS
  Administered 2013-06-26: 125 mL via INTRAVENOUS
  Administered 2013-06-26: 03:00:00 via INTRAVENOUS
  Filled 2013-06-24 (×8): qty 1000

## 2013-06-24 MED ORDER — HYDROMORPHONE HCL PF 1 MG/ML IJ SOLN
0.2500 mg | INTRAMUSCULAR | Status: DC | PRN
  Administered 2013-06-24 (×2): 0.5 mg via INTRAVENOUS

## 2013-06-24 MED ORDER — PROMETHAZINE HCL 25 MG/ML IJ SOLN: 6.2500 mg | INTRAMUSCULAR | Status: DC | PRN

## 2013-06-24 MED ORDER — PROMETHAZINE HCL 25 MG/ML IJ SOLN: 12.5000 mg | Freq: Four times a day (QID) | INTRAMUSCULAR | Status: DC | PRN

## 2013-06-24 MED ORDER — LACTATED RINGERS IR SOLN
Status: DC | PRN
  Administered 2013-06-24: 1

## 2013-06-24 MED ORDER — ONDANSETRON HCL 4 MG/2ML IJ SOLN
INTRAMUSCULAR | Status: DC | PRN
  Administered 2013-06-24: 4 mg via INTRAVENOUS

## 2013-06-24 MED ORDER — MORPHINE SULFATE 2 MG/ML IJ SOLN
2.0000 mg | INTRAMUSCULAR | Status: DC | PRN
  Administered 2013-06-24: 2 mg via INTRAVENOUS
  Administered 2013-06-24 – 2013-06-26 (×9): 4 mg via INTRAVENOUS
  Filled 2013-06-24 (×8): qty 2
  Filled 2013-06-24: qty 1
  Filled 2013-06-24: qty 2

## 2013-06-24 MED ORDER — DEXTROSE 5 % IV SOLN
INTRAVENOUS | Status: AC
  Filled 2013-06-24: qty 1

## 2013-06-24 MED ORDER — METOCLOPRAMIDE HCL 5 MG/ML IJ SOLN
INTRAMUSCULAR | Status: DC | PRN
  Administered 2013-06-24: 10 mg via INTRAVENOUS

## 2013-06-24 MED ORDER — BUPIVACAINE-EPINEPHRINE 0.25% -1:200000 IJ SOLN
INTRAMUSCULAR | Status: DC | PRN
  Administered 2013-06-24: 60 mL

## 2013-06-24 MED ORDER — ACETAMINOPHEN 160 MG/5ML PO SOLN: 650.0000 mg | ORAL | Status: DC | PRN

## 2013-06-24 MED ORDER — CEFOXITIN SODIUM 1 G IV SOLR
INTRAVENOUS | Status: AC
  Filled 2013-06-24: qty 1

## 2013-06-24 MED ORDER — TISSEEL VH 10 ML EX KIT
PACK | CUTANEOUS | Status: AC
  Filled 2013-06-24: qty 1

## 2013-06-24 MED ORDER — CEFOXITIN SODIUM 2 G IV SOLR
2.0000 g | INTRAVENOUS | Status: AC
  Administered 2013-06-24 (×2): 2 g via INTRAVENOUS
  Filled 2013-06-24: qty 2

## 2013-06-24 MED ORDER — HYDROMORPHONE HCL PF 1 MG/ML IJ SOLN
INTRAMUSCULAR | Status: AC
  Filled 2013-06-24: qty 2

## 2013-06-24 MED ORDER — HEPARIN SODIUM (PORCINE) 5000 UNIT/ML IJ SOLN
5000.0000 [IU] | Freq: Three times a day (TID) | INTRAMUSCULAR | Status: DC
  Administered 2013-06-25 – 2013-06-26 (×4): 5000 [IU] via SUBCUTANEOUS
  Filled 2013-06-24 (×7): qty 1

## 2013-06-24 MED ORDER — MIDAZOLAM HCL 5 MG/5ML IJ SOLN
INTRAMUSCULAR | Status: DC | PRN
  Administered 2013-06-24 (×2): 1 mg via INTRAVENOUS

## 2013-06-24 MED ORDER — HEPARIN SODIUM (PORCINE) 5000 UNIT/ML IJ SOLN
5000.0000 [IU] | INTRAMUSCULAR | Status: AC
  Administered 2013-06-24: 5000 [IU] via SUBCUTANEOUS
  Filled 2013-06-24: qty 1

## 2013-06-24 MED ORDER — ROCURONIUM BROMIDE 100 MG/10ML IV SOLN
INTRAVENOUS | Status: DC | PRN
  Administered 2013-06-24: 20 mg via INTRAVENOUS
  Administered 2013-06-24: 50 mg via INTRAVENOUS
  Administered 2013-06-24 (×2): 10 mg via INTRAVENOUS

## 2013-06-24 MED ORDER — KCL IN DEXTROSE-NACL 20-5-0.45 MEQ/L-%-% IV SOLN
INTRAVENOUS | Status: AC
  Filled 2013-06-24: qty 1000

## 2013-06-24 MED ORDER — PANTOPRAZOLE SODIUM 40 MG IV SOLR
40.0000 mg | INTRAVENOUS | Status: DC
  Administered 2013-06-24 – 2013-06-25 (×2): 40 mg via INTRAVENOUS
  Filled 2013-06-24 (×3): qty 40

## 2013-06-24 MED ORDER — 0.9 % SODIUM CHLORIDE (POUR BTL) OPTIME
TOPICAL | Status: DC | PRN
  Administered 2013-06-24: 1000 mL

## 2013-06-24 MED ORDER — TISSEEL VH 10 ML EX KIT
PACK | CUTANEOUS | Status: DC | PRN
  Administered 2013-06-24: 10 mL

## 2013-06-24 MED ORDER — PROPOFOL 10 MG/ML IV BOLUS
INTRAVENOUS | Status: DC | PRN
  Administered 2013-06-24: 200 mg via INTRAVENOUS

## 2013-06-24 MED ORDER — DEXAMETHASONE SODIUM PHOSPHATE 10 MG/ML IJ SOLN
INTRAMUSCULAR | Status: DC | PRN
  Administered 2013-06-24: 5 mg via INTRAVENOUS

## 2013-06-24 MED ORDER — LIDOCAINE HCL (CARDIAC) 20 MG/ML IV SOLN
INTRAVENOUS | Status: DC | PRN
  Administered 2013-06-24: 60 mg via INTRAVENOUS

## 2013-06-24 MED ORDER — BUPIVACAINE-EPINEPHRINE PF 0.25-1:200000 % IJ SOLN
INTRAMUSCULAR | Status: AC
  Filled 2013-06-24: qty 60

## 2013-06-24 MED ORDER — NEOSTIGMINE METHYLSULFATE 1 MG/ML IJ SOLN
INTRAMUSCULAR | Status: DC | PRN
  Administered 2013-06-24: 4 mg via INTRAVENOUS

## 2013-06-24 SURGICAL SUPPLY — 87 items
ADH SKN CLS APL DERMABOND .7 (GAUZE/BANDAGES/DRESSINGS) ×1
APL SKNCLS STERI-STRIP NONHPOA (GAUZE/BANDAGES/DRESSINGS)
APL SRG 32X5 SNPLK LF DISP (MISCELLANEOUS) ×1
APPLICATOR COTTON TIP 6IN STRL (MISCELLANEOUS) IMPLANT
APPLIER CLIP ROT 10 11.4 M/L (STAPLE)
APR CLP MED LRG 11.4X10 (STAPLE)
BAG SPEC RTRVL LRG 6X4 10 (ENDOMECHANICALS) ×1
BENZOIN TINCTURE PRP APPL 2/3 (GAUZE/BANDAGES/DRESSINGS) ×1 IMPLANT
BLADE SURG SZ11 CARB STEEL (BLADE) ×2 IMPLANT
CABLE HIGH FREQUENCY MONO STRZ (ELECTRODE) IMPLANT
CANISTER SUCTION 2500CC (MISCELLANEOUS) ×2 IMPLANT
CHLORAPREP W/TINT 26ML (MISCELLANEOUS) ×4 IMPLANT
CLAMP ENDO BABCK 10MM (STAPLE) ×1 IMPLANT
CLIP APPLIE ROT 10 11.4 M/L (STAPLE) IMPLANT
CLOTH BEACON ORANGE TIMEOUT ST (SAFETY) ×2 IMPLANT
COVER SURGICAL LIGHT HANDLE (MISCELLANEOUS) ×1 IMPLANT
DECANTER SPIKE VIAL GLASS SM (MISCELLANEOUS) ×2 IMPLANT
DERMABOND ADVANCED (GAUZE/BANDAGES/DRESSINGS) ×1
DERMABOND ADVANCED .7 DNX12 (GAUZE/BANDAGES/DRESSINGS) IMPLANT
DEVICE SUT QUICK LOAD TK 5 (STAPLE) ×1 IMPLANT
DEVICE SUT TI-KNOT TK 5X26 (MISCELLANEOUS) ×1 IMPLANT
DEVICE SUTURE ENDOST 10MM (ENDOMECHANICALS) ×2 IMPLANT
DEVICE TROCAR PUNCTURE CLOSURE (ENDOMECHANICALS) ×1 IMPLANT
DISSECTOR BLUNT TIP ENDO 5MM (MISCELLANEOUS) ×2 IMPLANT
DRAIN PENROSE 18X1/2 LTX STRL (DRAIN) ×2 IMPLANT
DRAPE CAMERA CLOSED 9X96 (DRAPES) ×2 IMPLANT
DRAPE LAPAROSCOPIC ABDOMINAL (DRAPES) ×2 IMPLANT
DRAPE UTILITY XL STRL (DRAPES) ×2 IMPLANT
ELECT REM PT RETURN 9FT ADLT (ELECTROSURGICAL) ×2
ELECTRODE REM PT RTRN 9FT ADLT (ELECTROSURGICAL) ×1 IMPLANT
FILTER SMOKE EVAC LAPAROSHD (FILTER) IMPLANT
GLOVE BIOGEL M STRL SZ7.5 (GLOVE) ×4 IMPLANT
GLOVE BIOGEL PI IND STRL 7.0 (GLOVE) IMPLANT
GLOVE BIOGEL PI INDICATOR 7.0 (GLOVE)
GLOVE SURG SS PI 7.5 STRL IVOR (GLOVE) ×4 IMPLANT
GOWN PREVENTION PLUS LG XLONG (DISPOSABLE) ×2 IMPLANT
GOWN STRL REIN XL XLG (GOWN DISPOSABLE) ×9 IMPLANT
GRASPER ENDO BABCOCK 10 (MISCELLANEOUS) IMPLANT
GRASPER ENDO BABCOCK 10MM (MISCELLANEOUS)
HOVERMATT SINGLE USE (MISCELLANEOUS) ×2 IMPLANT
KIT BASIN OR (CUSTOM PROCEDURE TRAY) ×2 IMPLANT
MARKER SKIN DUAL TIP RULER LAB (MISCELLANEOUS) ×2 IMPLANT
NDL SPNL 22GX3.5 QUINCKE BK (NEEDLE) ×1 IMPLANT
NEEDLE SPNL 22GX3.5 QUINCKE BK (NEEDLE) ×2 IMPLANT
NS IRRIG 1000ML POUR BTL (IV SOLUTION) ×2 IMPLANT
PACK UNIVERSAL I (CUSTOM PROCEDURE TRAY) ×2 IMPLANT
PENCIL BUTTON HOLSTER BLD 10FT (ELECTRODE) ×2 IMPLANT
POUCH SPECIMEN RETRIEVAL 10MM (ENDOMECHANICALS) ×1 IMPLANT
RELOAD BLUE (STAPLE) ×5 IMPLANT
RELOAD GREEN (STAPLE) ×4 IMPLANT
SCALPEL HARMONIC ACE (MISCELLANEOUS) ×1 IMPLANT
SCISSORS LAP 5X35 DISP (ENDOMECHANICALS) ×2 IMPLANT
SEALANT SURGICAL APPL DUAL CAN (MISCELLANEOUS) ×2 IMPLANT
SET IRRIG TUBING LAPAROSCOPIC (IRRIGATION / IRRIGATOR) ×2 IMPLANT
SHEARS CURVED HARMONIC AC 45CM (MISCELLANEOUS) ×2 IMPLANT
SLEEVE ADV FIXATION 5X100MM (TROCAR) IMPLANT
SLEEVE XCEL OPT CAN 5 100 (ENDOMECHANICALS) ×7 IMPLANT
SLEEVE Z-THREAD 5X100MM (TROCAR) IMPLANT
SOLUTION ANTI FOG 6CC (MISCELLANEOUS) ×2 IMPLANT
SPONGE GAUZE 4X4 12PLY (GAUZE/BANDAGES/DRESSINGS) IMPLANT
SPONGE LAP 18X18 X RAY DECT (DISPOSABLE) ×2 IMPLANT
STAPLE ECHEON FLEX 60 POW ENDO (STAPLE) ×4 IMPLANT
STAPLER VISISTAT 35W (STAPLE) ×2 IMPLANT
STRIP CLOSURE SKIN 1/2X4 (GAUZE/BANDAGES/DRESSINGS) IMPLANT
SUT MNCRL AB 4-0 PS2 18 (SUTURE) ×4 IMPLANT
SUT SURGIDAC NAB ES-9 0 48 120 (SUTURE) ×8 IMPLANT
SUT VIC AB 0 UR5 27 (SUTURE) ×2 IMPLANT
SUT VIC AB 4-0 SH 18 (SUTURE) ×2 IMPLANT
SYR 20CC LL (SYRINGE) ×2 IMPLANT
SYR 30ML LL (SYRINGE) ×2 IMPLANT
SYR 50ML LL SCALE MARK (SYRINGE) ×2 IMPLANT
TIP INNERVISION DETACH 40FR (MISCELLANEOUS) IMPLANT
TIP INNERVISION DETACH 50FR (MISCELLANEOUS) IMPLANT
TIP INNERVISION DETACH 56FR (MISCELLANEOUS) IMPLANT
TIPS INNERVISION DETACH 40FR (MISCELLANEOUS)
TRAY FOLEY CATH 14FRSI W/METER (CATHETERS) ×2 IMPLANT
TRAY LAP CHOLE (CUSTOM PROCEDURE TRAY) ×2 IMPLANT
TROCAR ADV FIXATION 11X100MM (TROCAR) IMPLANT
TROCAR ADV FIXATION 5X100MM (TROCAR) IMPLANT
TROCAR BLADELESS OPT 5 100 (ENDOMECHANICALS) ×2 IMPLANT
TROCAR XCEL 12X100 BLDLESS (ENDOMECHANICALS) ×3 IMPLANT
TROCAR XCEL BLUNT TIP 100MML (ENDOMECHANICALS) ×1 IMPLANT
TROCAR XCEL NON-BLD 11X100MML (ENDOMECHANICALS) ×2 IMPLANT
TROCAR XCEL UNIV SLVE 11M 100M (ENDOMECHANICALS) IMPLANT
TUBING CONNECTING 10 (TUBING) ×2 IMPLANT
TUBING ENDO SMARTCAP (MISCELLANEOUS) ×2 IMPLANT
TUBING FILTER THERMOFLATOR (ELECTROSURGICAL) ×2 IMPLANT

## 2013-06-24 NOTE — Interval H&P Note (Signed)
History and Physical Interval Note:  06/24/2013 7:13 AM  Brenda Ware  has presented today for surgery, with the diagnosis of morbid obesity   The various methods of treatment have been discussed with the patient and family. After consideration of risks, benefits and other options for treatment, the patient has consented to  Procedure(s): LAPAROSCOPIC GASTRIC SLEEVE RESECTION (N/A) LAPAROSCOPIC REPAIR OF HIATAL HERNIA (N/A) as a surgical intervention .  The patient's history has been reviewed, patient examined, no change in status, stable for surgery.  I have reviewed the patient's chart and labs.  Questions were answered to the patient's satisfaction.    Mary Sella. Andrey Campanile, MD, FACS General, Bariatric, & Minimally Invasive Surgery [facility], [place]  [facility] M

## 2013-06-24 NOTE — H&P (View-Only) (Signed)
Patient ID: Brenda Ware, female   DOB: 11/07/1965, 47 y.o.   MRN: [id number]  Chief Complaint  Patient presents with  . Bariatric Pre-op    HPI Brenda Ware is a 47 y.o. female.   HPI 47 year old morbidly obese Caucasian female comes in today for her preoperative appointment. She is currently scheduled to undergo laparoscopic vertical sleeve gastrectomy and possible hiatal hernia repair on September 29. She was last seen in the office on May 21. She denies any significant change to her medical history since that time. She states that she is no longer taking her reflux medication because she hasn't had any reflux problems. Her blood pressure initially in the office today was low but elevated. She is unsure why it was elevated. On recheck it was better. She states that she hasn't taken any medications for blood pressure in a very long time. She has started her preoperative diet. Past Medical History  Diagnosis Date  . SUI (stress urinary incontinence, female)   . GERD (gastroesophageal reflux disease)   . Seasonal allergies   . 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 Morbid obesity     Past Surgical History  Procedure Laterality Date  . Tubal ligation  1991  . Cholecystectomy  1991  . Bladder suspension  04/17/2012    Procedure: SPARC PROCEDURE;  Surgeon: Martina Sinner, MD;  Location: [facility];  Service: Urology;  Laterality: N/A;    History reviewed. No pertinent family history.  Social History History  Substance Use Topics  . Smoking status: Former Smoker -- 1.00 packs/day for 10 years    Types: Cigarettes    Quit date: 12/04/2012  . Smokeless tobacco: Never Used  . Alcohol Use: No    Allergies  Allergen Reactions  . Avelox [Moxifloxacin Hcl In Nacl] Other (See Comments)    Causes throat and tongue to swell  . Doxycycline Other (See Comments)    GI UPSET  .  Vicodin [Hydrocodone-Acetaminophen] Other (See Comments)    GI UPSET    No current outpatient prescriptions on file.   No current facility-administered medications for this visit.    Review of Systems Review of Systems  Constitutional: Negative for fever, chills, diaphoresis, activity change and unexpected weight change.  HENT: Negative for hearing loss, congestion, neck pain and dental problem.   Eyes: Negative for photophobia and visual disturbance.  Respiratory: Negative for chest tightness and shortness of breath.   Cardiovascular: Negative for chest pain.       Some ankle swelling at end of work day; no orthopnea, PND, DOE  Gastrointestinal: Negative for abdominal pain, diarrhea, constipation, abdominal distention and rectal pain.  Genitourinary: Negative for hematuria, flank pain, difficulty urinating and dyspareunia.  Musculoskeletal:       Joint pain  Neurological: Negative for dizziness, seizures, facial asymmetry, light-headedness, numbness and headaches.  Psychiatric/Behavioral: Negative for behavioral problems and decreased concentration.    Blood pressure 142/68, pulse 80, resp. rate 16, height 5\' 5"  (1.651 m), weight 278 lb (126.1 kg).  Physical Exam Physical Exam  Vitals reviewed. Constitutional: She is oriented to person, place, and time. She appears well-developed and well-nourished. No distress.  Morbidly obese  HENT:  Head: Normocephalic and atraumatic.  Right Ear: External ear normal.  Left Ear: External ear normal.  Eyes: Conjunctivae are normal. No scleral icterus.  Neck: Normal range of motion. Neck supple. No tracheal deviation  present. No thyromegaly present.  Cardiovascular: Normal rate and normal heart sounds.   Pulmonary/Chest: Effort normal and breath sounds normal. No stridor. No respiratory distress. She has no wheezes.  Abdominal: Soft. She exhibits no distension. There is no tenderness. There is no rebound and no guarding.  Well-healed trocar  site  Musculoskeletal: She exhibits no edema.  Lymphadenopathy:    She has no cervical adenopathy.  Neurological: She is alert and oriented to person, place, and time. She exhibits normal muscle tone.  Skin: Skin is warm and dry. No rash noted. She is not diaphoretic. No erythema.  Psychiatric: She has a normal mood and affect. Her behavior is normal. Judgment and thought content normal.    Data Reviewed My office note Labs from 02/2013 - nml cmet, cbc; TG 161, LDL 109, HDL 35 UGI- small sliding hiatal hernia  Assessment    Morbid obesity BMI 46.26 HTN Stress urinary incontinence H/o GERD Dislipidemia (no meds) Joint Pain     Plan    We reviewed her preoperative workup and discuss her upper GI results which demonstrated a small sliding hiatal hernia. Interestingly she says she no longer has reflux. I explained that we would test for her and evaluate for the hiatal hernia during surgery. If we found anything  Significant I would recommend and proceed with hiatal hernia repair at the time of her sleeve in order to decrease her chance of postoperative gastroesophageal reflux disease. We reviewed her preoperative labs and discuss her cholesterol panel. We discussed the importance of her preoperative diet. We also discussed the importance of getting some exercise next week. We reviewed the operative and postoperative steps. We also re-discussed some of the risk of surgery including leak. All of her questions were asked and answered. She is currently scheduled for laparoscopic vertical sleeve gastrectomy with possible hiatal hernia repair on September 29.  Brenda Ware. Brenda Campanile, MD, FACS General, Bariatric, & Minimally Invasive Surgery [facility], [place]        [facility] M 06/14/2013, 12:18 PM

## 2013-06-24 NOTE — Op Note (Signed)
06/24/2013  10:23 AM  PATIENT:  Brenda Ware  47 y.o. female [id number] [birth year]  PRE-OPERATIVE DIAGNOSIS:   Morbid obesity BMI 46 HTN  Stress urinary incontinence  H/o GERD  Dislipidemia (no meds)  Joint Pain  Sliding Hiatal Hernia    POST-OPERATIVE DIAGNOSIS:  same   PROCEDURE:  Procedure(s): LAPAROSCOPIC GASTRIC SLEEVE RESECTION LAPAROSCOPIC REPAIR OF HIATAL HERNIA UPPER GI ENDOSCOPY  SURGEON:  Surgeon(s): Atilano Ina, MD  ASSISTANTS:  Mariella Saa, MD  ANESTHESIA:   general  DRAINS: none   LOCAL MEDICATIONS USED:  MARCAINE     SPECIMEN:  Source of Specimen:  greater curvature of stomach  DISPOSITION OF SPECIMEN:  PATHOLOGY  COUNTS:  YES  INDICATION FOR PROCEDURE: 47 year old Caucasian female presents for planned laparoscopic sleeve gastrectomy possible hiatal hernia repair. The patient has a long-standing history of morbid obesity. She has been unable to have sustain weight loss. Her preoperative upper GI also demonstrated a small sliding hiatal hernia and the patient has a history of gastroesophageal reflux disease; therefore, we discussed the possibility of a hiatal hernia repair if we found a clinically significant hiatal hernia on dissection. All of her questions were asked and answered  PROCEDURE: prior to the procedure the patient received 5000 units of subcutaneous heparin. After obtaining informed consent she was taken to the operating room and placed upon the operating table. General endotracheal anesthesia was established. A Foley catheter was placed. Her abdomen prepped and draped in the usual standard surgical fashion with ChloraPrep. She received IV antibiotics prior to skin incision. A surgical timeout was performed.  We gained entry to her abdominal cavity using the Optiview technique. A small incision was made 2 fingerbreadths below her left subcostal margin with a #11 blade. Using a 0 10 mm laparoscope thru a 12mm trocar, I advanced the  laparoscope through all layers of the abdominal wall and carefully entered the abdominal cavity. Pneumoperitoneum was established to the patient pressure of 15 mm mercury which she tolerated without change in her vital signs. The abdominal cavity was surveilled and there was no evidence of injury to surrounding structures. A 5 mm trocar was placed slightly above and to the left of the umbilicus. A 5 mm trocar was placed in the lateral right upper quadrant. A 12 mm trocar was placed in the right mid abdomen in the midclavicular line. A 5 mm trocar was placed in the subxiphoid position. Then a 5 mm port placed in the lateral left mid abdomen in the mid axillary line. All trocars were placed under direct visualization after local had been infiltrated. The subxiphoid trocar was removed and the [facility] liver retractor was placed under the left lobe of the liver. The patient was placed slightly in reverse Trendelenburg position.  Because of the possibility of a hiatal hernia, we started taking down the gastrohepatic ligament. The right crus of the diaphragm was identified. The patient had a preponderance of fat in this area. The left crus was identified as well. There was a gap between the 2 consistent with a small hernia.   I then turned my attention to identifying the pylorus. Measuring approximately 5-1/2 cm from the pylorus we started taking down the short gastrics using the harmonic scalpel. I was able to get into the lesser sac and I continued to march up the greater curvature of the stomach taking down the short gastric vessels with the harmonic scalpel. 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 We were approaching the  short gastrics near the angle of his. One of the short gastrics near the apex started bleeding. A Ray-Tec sponge was placed in the abdominal cavity to help control the bleeding. We changed to a longer 30 degree laparoscope which facilitated visualization. Hemostasis  was ultimately achieved with the harmonic scalpel. The Ray-Tec sponge was removed. The stomach had been completely mobilized from the left crus the diaphragm. We checked for posterior attachments which there were several of. These were taken down with Endo Shears. The stomach was completely mobile. We did not violate the blood supply of the lesser curvature. The orogastric tube was removed. We then remeasured about 6cm from the pylorus and identified the area where we would start our staple line. I also reconfirmed the location of the angularis. Using a 60 mm Ethicon echelon stapler with green load, I fired the first cartridge leaving adequate space at the angularis. We then had the nurse anesthetist pass a 36 French bougie down the oropharynx into the stomach, along the lesser curvature the stomach down towards the pylorus. I then fired another 60 mm green load since we were still slightly in the antrum. The stomach was rotated to make sure there was adequate distance both anteriorly and posteriorly. After this firing, I used 60 mm blue load stapler cartridges to complete the gastric resection. Before each fire of the stapler, we rotated the stomach to make sure that there was adequate stomach left along the lesser curvature. We also rotated and moved the bougie in & out. We also insured that the greater curvature the stomach was being pulled equally between the anterior and posterior surface. The last fire was just taken lateral to the gastroesophageal fat pad. This freed the greater curvature of the stomach. There was no corkscrewing of the staple line. There appeared to be adequate hemostasis along the staple line. The bougie was removed. We then went about closing the hiatal hernia defect. Again we identified the left crus and the right crus. It appeared that we would only need one suture. A 2-0 Ethibond on Endo Stitch was used to reapproximate the left and right crus and secured with a titanium tie  knot.  Dr. Johna Sheriff then scrubbed out and obtained the Olympus endoscope and glided it down into the gastric sleeve. Please see his operative note for further details. There was no evidence of bubbles. The endoscope was removed and the sleeve was decompressed. We then brought out the greater curvature of the stomach through the mid right abdomen 12 mm trocar after it had been enlarged with a Tresa Endo. The 12 mm trocar was replaced. I reinspected the long staple line. There is no evidence of bleeding along the staple line. Tisseel tissue sealant was then sprayed along the entire staple line.  [facility] liver retractor was removed and there is no evidence of injury to the liver. The right midabdomen 12 mm trocar was removed and the defect was closed with 2 2-0 Vicryl sutures using an Endo Close suture device. There was no air leak. Local was placed in all trocar sites. Pneumoperitoneum was released. The skin was closed with a 4-0 Monocryl in a subcuticular fashion followed by the application of Dermabond. The patient was extubated and taken to the recovery in stable condition. All needle, instrument, and sponge counts were correct x2.  PLAN OF CARE: Admit to inpatient   PATIENT DISPOSITION:  PACU - hemodynamically stable.   Delay start of Pharmacological VTE agent (>24hrs) due to surgical blood loss or  risk of bleeding:  no  Mary Sella. Andrey Campanile, MD, FACS General, Bariatric, & Minimally Invasive Surgery [facility], [place]

## 2013-06-24 NOTE — Transfer of Care (Signed)
Immediate Anesthesia Transfer of Care Note  Patient: Brenda Ware  Procedure(s) Performed: Procedure(s): LAPAROSCOPIC GASTRIC SLEEVE RESECTION (N/A) LAPAROSCOPIC REPAIR OF HIATAL HERNIA (N/A) UPPER GI ENDOSCOPY (N/A)  Patient Location: PACU  Anesthesia Type:General  Level of Consciousness: awake, alert , oriented and patient cooperative  Airway & Oxygen Therapy: Patient Spontanous Breathing and Patient connected to face mask oxygen  Post-op Assessment: Report given to PACU RN, Post -op Vital signs reviewed and stable and Patient moving all extremities  Post vital signs: Reviewed and stable  Complications: No apparent anesthesia complications

## 2013-06-24 NOTE — Anesthesia Postprocedure Evaluation (Signed)
  Anesthesia Post-op Note  Patient: Brenda Ware  Procedure(s) Performed: Procedure(s) (LRB): LAPAROSCOPIC GASTRIC SLEEVE RESECTION (N/A) LAPAROSCOPIC REPAIR OF HIATAL HERNIA (N/A) UPPER GI ENDOSCOPY (N/A)  Patient Location: PACU  Anesthesia Type: General  Level of Consciousness: awake and alert   Airway and Oxygen Therapy: Patient Spontanous Breathing  Post-op Pain: mild  Post-op Assessment: Post-op Vital signs reviewed, Patient's Cardiovascular Status Stable, Respiratory Function Stable, Patent Airway and No signs of Nausea or vomiting  Last Vitals:  Filed Vitals:   06/24/13 1145  BP: 112/66  Pulse: 68  Temp:   Resp: 14    Post-op Vital Signs: stable   Complications: No apparent anesthesia complications

## 2013-06-25 ENCOUNTER — Encounter (HOSPITAL_COMMUNITY): Payer: Self-pay | Admitting: General Surgery

## 2013-06-25 ENCOUNTER — Inpatient Hospital Stay (HOSPITAL_COMMUNITY): Payer: BC Managed Care – PPO

## 2013-06-25 LAB — CBC WITH DIFFERENTIAL/PLATELET
Basophils Absolute: 0 10*3/uL (ref 0.0–0.1)
Basophils Relative: 0 % (ref 0–1)
Eosinophils Absolute: 0 10*3/uL (ref 0.0–0.7)
Eosinophils Relative: 0 % (ref 0–5)
HCT: 33.3 % — ABNORMAL LOW (ref 36.0–46.0)
Lymphocytes Relative: 13 % (ref 12–46)
MCH: 29.8 pg (ref 26.0–34.0)
MCHC: 34.2 g/dL (ref 30.0–36.0)
Monocytes Absolute: 0.9 10*3/uL (ref 0.1–1.0)
Neutro Abs: 10.9 10*3/uL — ABNORMAL HIGH (ref 1.7–7.7)
Platelets: 319 10*3/uL (ref 150–400)
RDW: 12.4 % (ref 11.5–15.5)
WBC: 13.5 10*3/uL — ABNORMAL HIGH (ref 4.0–10.5)

## 2013-06-25 LAB — COMPREHENSIVE METABOLIC PANEL
ALT: 186 U/L — ABNORMAL HIGH (ref 0–35)
AST: 178 U/L — ABNORMAL HIGH (ref 0–37)
Albumin: 3.1 g/dL — ABNORMAL LOW (ref 3.5–5.2)
BUN: 9 mg/dL (ref 6–23)
Calcium: 8.6 mg/dL (ref 8.4–10.5)
Chloride: 102 mEq/L (ref 96–112)
Creatinine, Ser: 0.83 mg/dL (ref 0.50–1.10)
Sodium: 136 mEq/L (ref 135–145)
Total Bilirubin: 0.3 mg/dL (ref 0.3–1.2)

## 2013-06-25 LAB — HEMOGLOBIN AND HEMATOCRIT, BLOOD: HCT: 34.4 % — ABNORMAL LOW (ref 36.0–46.0)

## 2013-06-25 MED ORDER — IOHEXOL 300 MG/ML  SOLN
50.0000 mL | Freq: Once | INTRAMUSCULAR | Status: AC | PRN
  Administered 2013-06-25: 50 mL via ORAL

## 2013-06-25 MED ORDER — GI COCKTAIL ~~LOC~~
30.0000 mL | Freq: Once | ORAL | Status: AC | PRN
  Administered 2013-06-25: 30 mL via ORAL
  Filled 2013-06-25: qty 30

## 2013-06-25 NOTE — Progress Notes (Signed)
1 Day Post-Op  Subjective: C/o of burning sensation in upper abd and some nausea. Nausea meds help with burning. Spit up small clot. abd soreness  Objective: Vital signs in last 24 hours: Temp:  [97.4 F (36.3 C)-98.6 F (37 C)] 97.9 F (36.6 C) (09/30 0446) Pulse Rate:  [68-100] 83 (09/30 0446) Resp:  [13-23] 20 (09/30 0446) BP: (101-134)/(65-85) 122/76 mmHg (09/30 0446) SpO2:  [91 %-100 %] 97 % (09/30 0446) Last BM Date: 06/23/13  Intake/Output from previous day: 09/29 0701 - 09/30 0700 In: 5389.6 [I.V.:5389.6] Out: 3555 [Urine:2755; Blood:800] Intake/Output this shift:    Walking in room. Nontoxic.  cta b/l Reg Obese, soft, incisions c/d/i. approp TTP  Lab Results:   Recent Labs  06/24/13 1040 06/25/13 0419  WBC  --  13.5*  HGB 12.0 11.4*  HCT 35.5* 33.3*  PLT  --  319   BMET  Recent Labs  06/25/13 0419  NA 136  K 3.8  CL 102  CO2 25  GLUCOSE 122*  BUN 9  CREATININE 0.83  CALCIUM 8.6   PT/INR No results found for this basename: LABPROT, INR,  in the last 72 hours ABG No results found for this basename: PHART, PCO2, PO2, HCO3,  in the last 72 hours  Studies/Results: No results found.  Anti-infectives: Anti-infectives   Start     Dose/Rate Route Frequency Ordered Stop   06/24/13 0630  cefOXitin (MEFOXIN) 2 g in dextrose 5 % 50 mL IVPB     2 g 100 mL/hr over 30 Minutes Intravenous On call to O.R. 06/24/13 1610 06/24/13 0940      Assessment/Plan: s/p Procedure(s): LAPAROSCOPIC GASTRIC SLEEVE RESECTION (N/A) LAPAROSCOPIC REPAIR OF HIATAL HERNIA (N/A) UPPER GI ENDOSCOPY (N/A)  No fever, tachycardia NPO UGI this am, if ok will start water/ice chips ABL anemia - hgb down some from yesterday pm. Cont to monitor. Will go ahead and start subcu heparin for VTE prophylaxis; repeat hgb this pm Ambulate, IS Pain meds  Mary Sella. Andrey Campanile, MD, FACS General, Bariatric, & Minimally Invasive Surgery [facility], [place]   LOS: 1 day     Atilano Ina 06/25/2013

## 2013-06-25 NOTE — Care Management Note (Signed)
    Page 1 of 1   06/25/2013     10:47:06 AM   CARE MANAGEMENT NOTE 06/25/2013  Patient:  Brenda Ware, Brenda Ware   Account Number:  [id number]  Date Initiated:  06/25/2013  Documentation initiated by:  Lorenda Ishihara  Subjective/Objective Assessment:   47 yo female admitted s/p lap gastric sleeve and hernia repair. PTA lived at home with spouse.     Action/Plan:   Home when stable   Anticipated DC Date:  06/27/2013   Anticipated DC Plan:  HOME/SELF CARE      DC Planning Services  CM consult      Choice offered to / List presented to:             Status of service:  Completed, signed off Medicare Important Message given?   (If response is "NO", the following Medicare IM given date fields will be blank) Date Medicare IM given:   Date Additional Medicare IM given:    Discharge Disposition:  HOME/SELF CARE  Per UR Regulation:  Reviewed for med. necessity/level of care/duration of stay  If discussed at Long Length of Stay Meetings, dates discussed:    Comments:

## 2013-06-25 NOTE — Plan of Care (Signed)
Problem: Phase I Progression Outcomes Goal: Diet - NPO Outcome: Completed/Met Date Met:  06/25/13 Patient instructed not to drink water or ice.  Patient states she had water earlier.  All fluids removed mouth swabs and biotene rinse given.

## 2013-06-26 DIAGNOSIS — Z9884 Bariatric surgery status: Secondary | ICD-10-CM

## 2013-06-26 DIAGNOSIS — D62 Acute posthemorrhagic anemia: Secondary | ICD-10-CM | POA: Diagnosis not present

## 2013-06-26 DIAGNOSIS — K449 Diaphragmatic hernia without obstruction or gangrene: Secondary | ICD-10-CM | POA: Diagnosis present

## 2013-06-26 LAB — CBC WITH DIFFERENTIAL/PLATELET
Basophils Relative: 1 % (ref 0–1)
HCT: 30.3 % — ABNORMAL LOW (ref 36.0–46.0)
Hemoglobin: 9.9 g/dL — ABNORMAL LOW (ref 12.0–15.0)
Lymphocytes Relative: 28 % (ref 12–46)
Lymphs Abs: 2.5 10*3/uL (ref 0.7–4.0)
MCH: 28.9 pg (ref 26.0–34.0)
MCHC: 32.7 g/dL (ref 30.0–36.0)
Monocytes Absolute: 0.7 10*3/uL (ref 0.1–1.0)
Monocytes Relative: 8 % (ref 3–12)
Neutro Abs: 5.5 10*3/uL (ref 1.7–7.7)
Platelets: 254 10*3/uL (ref 150–400)
RBC: 3.42 MIL/uL — ABNORMAL LOW (ref 3.87–5.11)
WBC: 8.7 10*3/uL (ref 4.0–10.5)

## 2013-06-26 LAB — HEMOGLOBIN AND HEMATOCRIT, BLOOD
HCT: 31.7 % — ABNORMAL LOW (ref 36.0–46.0)
Hemoglobin: 10.7 g/dL — ABNORMAL LOW (ref 12.0–15.0)

## 2013-06-26 MED ORDER — PANTOPRAZOLE SODIUM 40 MG PO TBEC: 40.0000 mg | DELAYED_RELEASE_TABLET | Freq: Every day | ORAL | Status: DC

## 2013-06-26 MED ORDER — OXYCODONE HCL 5 MG/5ML PO SOLN: 5.0000 mg | ORAL | Status: DC | PRN

## 2013-06-26 MED ORDER — ONDANSETRON HCL 4 MG/5ML PO SOLN: 4.0000 mg | Freq: Once | ORAL | Status: DC

## 2013-06-26 NOTE — Progress Notes (Signed)
2 Days Post-Op  Subjective: Tolerated ice chips/water. Burning sensation resolved after GI cocktail. +flatus. No hematemesis or melena. Still sore around 1 trocar site. ambulating  Objective: Vital signs in last 24 hours: Temp:  [97.9 F (36.6 C)-98.5 F (36.9 C)] 97.9 F (36.6 C) (10/01 0516) Pulse Rate:  [80-90] 80 (10/01 0516) Resp:  [18-20] 18 (10/01 0516) BP: (102-124)/(64-77) 102/64 mmHg (10/01 0516) SpO2:  [93 %-96 %] 96 % (10/01 0516) Last BM Date: 06/23/13  Intake/Output from previous day: 09/30 0701 - 10/01 0700 In: 3060.4 [I.V.:3060.4] Out: 1500 [Urine:1500] Intake/Output this shift:    Nad, alert cta b/l Reg Obese, soft, incisions c/d/i. Only ttp at right mid-abdomen trocar site  Lab Results:   Recent Labs  06/25/13 0419 06/25/13 1722 06/26/13 0343  WBC 13.5*  --  8.7  HGB 11.4* 11.6* 9.9*  HCT 33.3* 34.4* 30.3*  PLT 319  --  254   BMET  Recent Labs  06/25/13 0419  NA 136  K 3.8  CL 102  CO2 25  GLUCOSE 122*  BUN 9  CREATININE 0.83  CALCIUM 8.6   PT/INR No results found for this basename: LABPROT, INR,  in the last 72 hours ABG No results found for this basename: PHART, PCO2, PO2, HCO3,  in the last 72 hours  Studies/Results: Dg Ugi W/water Sol Cm  06/25/2013   CLINICAL DATA:  Postop gastric sleeve resection.  EXAM: ESOPHAGUS/BARIUM SWALLOW/TABLET STUDY  TECHNIQUE: After obtaining a scout radiograph a routine upper GI series was performed using 50 ml Omnipaque 300.  COMPARISON:  Preoperative upper GI series 03/20/2013.  FLUOROSCOPY TIME:  21 seconds.  FINDINGS: The scout abdominal radiograph demonstrates no free intraperitoneal air. The bowel gas pattern is normal. Cholecystectomy clips are noted.  The esophageal motility appears within normal limits. No significant residual hiatal hernia is identified. Contrast flows freely into the gastric remnant. There is no extravasation. There is normal emptying into the duodenum and proximal jejunum.   IMPRESSION: No demonstrated complication following gastric sleeve resection.   Electronically Signed   By: Roxy Horseman   On: 06/25/2013 10:07    Anti-infectives: Anti-infectives   Start     Dose/Rate Route Frequency Ordered Stop   06/24/13 0630  cefOXitin (MEFOXIN) 2 g in dextrose 5 % 50 mL IVPB     2 g 100 mL/hr over 30 Minutes Intravenous On call to O.R. 06/24/13 1610 06/24/13 0940      Assessment/Plan: s/p Procedure(s): LAPAROSCOPIC GASTRIC SLEEVE RESECTION (N/A) LAPAROSCOPIC REPAIR OF HIATAL HERNIA (N/A) UPPER GI ENDOSCOPY (N/A)  Doing well. No fever, tachycardia, nml wbc Adv to protein shakes ABL anemia - 11.6 -->9 this am. Probably a mix of dilutional and subcu heparin. No tachy. bp ok. Not c/o lightheadedness or dizziness. Repeat hgb at 11 am. If ok will let pt go home as long as tolerates protein shakes  Discussed with d/c instructions and post op diet Will send pt out on protonix, prn nausea med, and prn pain med  Brenda Ware. Brenda Campanile, MD, FACS General, Bariatric, & Minimally Invasive Surgery [facility], [place]   LOS: 2 days    Brenda Ware 06/26/2013

## 2013-06-26 NOTE — Discharge Summary (Signed)
Physician Discharge Summary  Brenda Ware AVW:[id number] DOB: [birth year] DOA: 06/24/2013  PCP: Marena Chancy, MD  Admit date: 06/24/2013 Discharge date: 06/26/2013  Recommendations for Outpatient Follow-up:   Follow-up Information   Follow up with Atilano Ina, MD On 07/11/2013. (9:30 AM)    Specialty:  General Surgery   Contact information:   [address] [place] [address] [phone]      Discharge Diagnoses:  Patient Active Problem List   Diagnosis Date Noted  . S/P laparoscopic sleeve gastrectomy 06/26/2013  . Acute blood loss anemia 06/26/2013  . Sliding hiatal hernia 06/26/2013  . Obesity, Class III, BMI 40-49.9 (morbid obesity) 06/14/2013  . HYPERCHOLESTEROLEMIA 11/23/2006  . HYPERTENSION, BENIGN SYSTEMIC 11/23/2006    Surgical Procedure: Laparoscopic sleeve gastrectomy and hiatal hernia repair with upper Endoscopy  Discharge Condition: Good Disposition: Home  Diet recommendation: Postoperative sleeve gastrectomy diet-liquids  Filed Weights   06/24/13 0532  Weight: 276 lb 4 oz (125.306 kg)    Hospital Course:  47 year old Caucasian female was admitted for planned laparoscopic sleeve gastrectomy. Her postoperative course was unremarkable. On postoperative day 1 she underwent an upper GI which demonstrated no extravasation of contrast and  Prompt emptying of her residual stomach. She was started on water and ice chips which she tolerated. On postoperative day 2 she was advanced to protein shakes which she tolerated. She was maintained on perioperative DVT prophylaxis which included subcutaneous heparin. Her blood count did drop however the hemoglobin was stable on day of discharge. She was afebrile without signs of tachycardia. She was ambulating without difficulty. Her pain was controlled on oral pain medications. She has been instructed on discharge teaching. She was deemed stable for discharge   Discharge Instructions      Discharge  Orders   Future Appointments Provider Department Dept Phone   07/11/2013 9:30 AM Atilano Ina, MD [facility] Surgery, [place] [phone]   Future Orders Complete By Expires   Discharge instructions  As directed    Comments:     See bariatric discharge instructions   Increase activity slowly  As directed        Medication List         ondansetron 4 MG/5ML solution  Commonly known as:  ZOFRAN  Take 5 mLs (4 mg total) by mouth once.     oxyCODONE 5 MG/5ML solution  Commonly known as:  ROXICODONE  Take 5-10 mLs (5-10 mg total) by mouth every 4 (four) hours as needed for pain.     pantoprazole 40 MG tablet  Commonly known as:  PROTONIX  Take 1 tablet (40 mg total) by mouth daily.       Follow-up Information   Follow up with Atilano Ina, MD On 07/11/2013. (9:30 AM)    Specialty:  General Surgery   Contact information:   [address] [place] [address] [phone]        The results of significant diagnostics from this hospitalization (including imaging, microbiology, ancillary and laboratory) are listed below for reference.    Significant Diagnostic Studies: Dg Ugi W/water Sol Cm  06/25/2013   CLINICAL DATA:  Postop gastric sleeve resection.  EXAM: ESOPHAGUS/BARIUM SWALLOW/TABLET STUDY  TECHNIQUE: After obtaining a scout radiograph a routine upper GI series was performed using 50 ml Omnipaque 300.  COMPARISON:  Preoperative upper GI series 03/20/2013.  FLUOROSCOPY TIME:  21 seconds.  FINDINGS: The scout abdominal radiograph demonstrates no free intraperitoneal air. The bowel gas pattern is normal.  Cholecystectomy clips are noted.  The esophageal motility appears within normal limits. No significant residual hiatal hernia is identified. Contrast flows freely into the gastric remnant. There is no extravasation. There is normal emptying into the duodenum and proximal jejunum.  IMPRESSION: No demonstrated complication following gastric sleeve resection.    Electronically Signed   By: Roxy Horseman   On: 06/25/2013 10:07    Microbiology: No results found for this or any previous visit (from the past 240 hour(s)).   Labs: Basic Metabolic Panel:  Recent Labs Lab 06/25/13 0419  NA 136  K 3.8  CL 102  CO2 25  GLUCOSE 122*  BUN 9  CREATININE 0.83  CALCIUM 8.6   Liver Function Tests:  Recent Labs Lab 06/25/13 0419  AST 178*  ALT 186*  ALKPHOS 42  BILITOT 0.3  PROT 6.2  ALBUMIN 3.1*   No results found for this basename: LIPASE, AMYLASE,  in the last 168 hours No results found for this basename: AMMONIA,  in the last 168 hours CBC:  Recent Labs Lab 06/24/13 1040 06/25/13 0419 06/25/13 1722 06/26/13 0343 06/26/13 1100  WBC  --  13.5*  --  8.7  --   NEUTROABS  --  10.9*  --  5.5  --   HGB 12.0 11.4* 11.6* 9.9* 10.7*  HCT 35.5* 33.3* 34.4* 30.3* 31.7*  MCV  --  87.2  --  88.6  --   PLT  --  319  --  254  --    Active Problems:   HYPERTENSION, BENIGN SYSTEMIC   Obesity, Class III, BMI 40-49.9 (morbid obesity)   S/P laparoscopic sleeve gastrectomy   Acute blood loss anemia   Sliding hiatal hernia   Time coordinating discharge: 10 minutes  Signed:  Atilano Ina, MD [facility] Surgery, [place] [phone] 06/26/2013, 11:57 AM

## 2013-06-26 NOTE — Progress Notes (Signed)
Patient alert and oriented, pain is controlled. Patient is tolerating fluids, plan to advance to protein shake today.  Reviewed Gastric sleeve discharge instructions with patient and patient is able to articulate understanding.   GASTRIC BYPASS / SLEEVE  Home Care Instructions  These instructions are to help you care for yourself when you go home.  Call: If you have any problems.   Call [phone] and ask for the surgeon on call   If you need immediate assistance come to the ER at [facility]. Tell the ER staff that you are a new post-op gastric bypass or gastric sleeve patient   Signs and symptoms to report:   Severe vomiting or nausea o If you cannot handle clear liquids for longer than 1 day, call your surgeon    Abdominal pain which does not get better after taking your pain medication   Fever greater than 100.4 F and chills   Heart rate over 100 beats a minute   Trouble breathing   Chest pain    Redness, swelling, drainage, or foul odor at incision (surgical) sites    If your incisions open or pull apart   Swelling or pain in calf (lower leg)   Diarrhea (Loose bowel movements that happen often), frequent watery, uncontrolled bowel movements   Constipation, (no bowel movements for 3 days) if this happens:  o Take Milk of Magnesia, 2 tablespoons by mouth, 3 times a day for 2 days if needed o Stop taking Milk of Magnesia once you have had a bowel movement o Call your doctor if constipation continues Or o Take Miralax  (instead of Milk of Magnesia) following the label instructions o Stop taking Miralax once you have had a bowel movement o Call your doctor if constipation continues   Anything you think is "abnormal for you"   Normal side effects after surgery:   Unable to sleep at night or unable to concentrate   Irritability   Being tearful (crying) or depressed These are common complaints, possibly related to your anesthesia, stress of surgery and change in lifestyle, that  usually go away a few weeks after surgery.  If these feelings continue, call your medical doctor.  Wound Care: You may have surgical glue, steri-strips, or staples over your incisions after surgery   Surgical glue:  Looks like a clear film over your incisions and will wear off a little at a time   Steri-strips : Adhesive strips of tape over your incisions. You may notice a yellowish color on the skin under the steri-strips. This is used to make the   steri-strips stick better. Do not pull the steri-strips off - let them fall off   Staples: Staples may be removed before you leave the hospital o If you go home with staples, call [facility] at for an appointment with your surgeon's nurse to have staples removed 10 days after surgery, [phone]   Showering: You may shower two (2) days after your surgery unless your surgeon tells you differently o Wash gently around incisions with warm soapy water, rinse well, and gently pat dry  o If you have a drain (tube from your incision), you may need someone to hold this while you shower  o No tub baths until staples are removed and incisions are healed     Medications:   Medications should be liquid or crushed if larger than the size of a dime   Extended release pills (medication that releases a little bit at a  time through the day) should not be crushed   Depending on the size and number of medications you take, you may need to space (take a few throughout the day)/change the time you take your medications so that you do not over-fill your pouch (smaller stomach)   Make sure you follow-up with your primary care physician to make medication changes needed during rapid weight loss and life-style changes   If you have diabetes, follow up with the doctor that orders your diabetes medication(s) within one week after surgery and check your blood sugar regularly.   Do not drive while taking narcotics (pain medications)   Do not take acetaminophen  (Tylenol) and Roxicet or Lortab Elixir at the same time since these pain medications contain acetaminophen  Diet:                    First 2 Weeks  You will see the nutritionist about two (2) weeks after your surgery. The nutritionist will increase the types of foods you can eat if you are handling liquids well:   If you have severe vomiting or nausea and cannot handle clear liquids lasting longer than 1 day, call your surgeon  Protein Shake   Drink at least 2 ounces of shake 5-6 times per day   Each serving of protein shakes (usually 8 - 12 ounces) should have a minimum of:  o 15 grams of protein  o And no more than 5 grams of carbohydrate    Goal for protein each day: o Men = 80 grams per day o Women = 60 grams per day   Protein powder may be added to fluids such as non-fat milk or Lactaid milk or Soy milk (limit to 35 grams added protein powder per serving)  Hydration   Slowly increase the amount of water and other clear liquids as tolerated (See Acceptable Fluids)   Slowly increase the amount of protein shake as tolerated     Sip fluids slowly and throughout the day   May use sugar substitutes in small amounts (no more than 6 - 8 packets per day; i.e. Splenda)  Fluid Goal   The first goal is to drink at least 8 ounces of protein shake/drink per day (or as directed by the nutritionist); some examples of protein shakes are [employer], [employer]illard[employer], EAS Edge HP, and Unjury. See handout from pre-op Bariatric Education Class: o Slowly increase the amount of protein shake you drink as tolerated o You may find it easier to slowly sip shakes throughout the day o It is important to get your proteins in first   Your fluid goal is to drink 64 - 100 ounces of fluid daily o It may take a few weeks to build up to this   32 oz (or more) should be clear liquids  And    32 oz (or more) should be full liquids (see below for examples)   Liquids should not contain sugar, caffeine, or  carbonation  Clear Liquids:   Water or Sugar-free flavored water (i.e. Fruit H2O, Propel)   Decaffeinated coffee or tea (sugar-free)   Crystal Lite, Wyler's Lite, Minute Maid Lite   Sugar-free Jell-O   Bouillon or broth   Sugar-free Popsicle:   *Less than 20 calories each; Limit 1 per day  Full Liquids: Protein Shakes/Drinks + 2 choices per day of other full liquids   Full liquids must be: o No More Than 12 grams of Carbs per serving  o  No More Than 3 grams of Fat per serving   Strained low-fat cream soup   Non-Fat milk   Fat-free Lactaid Milk   Sugar-free yogurt (Dannon Lite & Fit, Greek yogurt)      Vitamins and Minerals   Start 1 day after surgery unless otherwise directed by your surgeon   2 Chewable Multivitamin / Multimineral Supplement with iron (i.e. Centrum for Adults)   Vitamin B-12, 350 - 500 micrograms sub-lingual (place tablet under the tongue) each day   Chewable Calcium Citrate with Vitamin D-3 (Example: 3 Chewable Calcium Plus 600 with Vitamin D-3) o Take 500 mg three (3) times a day for a total of 1500 mg each day o Do not take all 3 doses of calcium at one time as it may cause constipation, and you can only absorb 500 mg  at a time  o Do not mix multivitamins containing iron with calcium supplements; take 2 hours apart o Do not substitute Tums (calcium carbonate) for your calcium   Menstruating women and those at risk for anemia (a blood disease that causes weakness) may need extra iron o Talk with your doctor to see if you need more iron   If you need extra iron: Total daily Iron recommendation (including Vitamins) is 50 to 100 mg Iron/day   Do not stop taking or change any vitamins or minerals until you talk to your nutritionist or surgeon   Your nutritionist and/or surgeon must approve all vitamin and mineral supplements   Activity and Exercise: It is important to continue walking at home.  Limit your physical activity as instructed by your doctor.  During  this time, use these guidelines:   Do not lift anything greater than ten (10) pounds for at least two (2) weeks   Do not go back to work or drive until Designer, industrial/product says you can   You may have sex when you feel comfortable  o It is VERY important for female patients to use a reliable birth control method; fertility often increases after surgery  o Do not get pregnant for at least 18 months   Start exercising as soon as your doctor tells you that you can o Make sure your doctor approves any physical activity   Start with a simple walking program   Walk 5-15 minutes each day, 7 days per week.    Slowly increase until you are walking 30-45 minutes per day Consider joining our BELT program. [phone] or email [email or web address]   Special Instructions Things to remember:   Free counseling is available for you and your family through collaboration between [facility] and [place]. Please call [phone] and leave a message   Use your CPAP when sleeping if this applies to you   [facility] has a free Bariatric Surgery Support Group that meets monthly, the 3rd Thursday, 6 pm, [facility] Classrooms You can see classes online at [email or web address]   It is very important to keep all follow up appointments with your surgeon, nutritionist, primary care physician, and behavioral health practitioner o After the first year, please follow up with your bariatric surgeon and nutritionist at least once a year in order to maintain best weight loss results [facility]: [phone] [facility]: [phone] Bariatric Nurse Coordinator: [phone]

## 2013-06-26 NOTE — Progress Notes (Signed)
Discharged via wheelchair with husband. Condition unchanged

## 2013-07-09 ENCOUNTER — Encounter: Payer: BC Managed Care – PPO | Attending: General Surgery | Admitting: *Deleted

## 2013-07-09 VITALS — Ht 65.0 in | Wt 261.0 lb

## 2013-07-09 DIAGNOSIS — Z713 Dietary counseling and surveillance: Secondary | ICD-10-CM | POA: Insufficient documentation

## 2013-07-09 DIAGNOSIS — Z01818 Encounter for other preprocedural examination: Secondary | ICD-10-CM | POA: Insufficient documentation

## 2013-07-11 ENCOUNTER — Encounter (INDEPENDENT_AMBULATORY_CARE_PROVIDER_SITE_OTHER): Payer: Self-pay | Admitting: General Surgery

## 2013-07-11 ENCOUNTER — Ambulatory Visit (INDEPENDENT_AMBULATORY_CARE_PROVIDER_SITE_OTHER): Payer: BC Managed Care – PPO | Admitting: General Surgery

## 2013-07-11 VITALS — BP 126/82 | HR 96 | Temp 97.8°F | Resp 16 | Ht 65.0 in | Wt 260.4 lb

## 2013-07-11 DIAGNOSIS — Z09 Encounter for follow-up examination after completed treatment for conditions other than malignant neoplasm: Secondary | ICD-10-CM

## 2013-07-11 NOTE — Patient Instructions (Signed)
Eating techniques 20-20-20 (30-30-30) 20 chews, 20 seconds between bites of food, 20 minutes to eat; sometimes you may need 30 chews, 30 seconds etc Use your nondominant hand to eat with Use a child/infant size utensil Try not to eat while watching TV Put fork down between bites of food  Try a different chewable Multivitamin Try a laxative like Miralax as needed for constipation Walk daily

## 2013-07-11 NOTE — Progress Notes (Signed)
Subjective:     Patient ID: Brenda Ware, female   DOB: 06-26-1966, 47 y.o.   MRN: [id number]  HPI 47 year old Caucasian female comes in today for her first postoperative appointment after undergoing laparoscopic sleeve gastrectomy with hiatal hernia repair on September 29. She was discharged from the hospital October 1. She was recently advanced to solid foods 2 days ago. She denies any fevers or chills. She has had some nausea. However the nausea is significantly improved. She is taking her Protonix as prescribed. She is having bowel movements. Although they are slightly infrequent. She has some left sided neck pain. However the neck pain is almost completely resolved. She denies any shortness of breath. She states that her tonics helps with her heartburn. She has some mild abdominal wall pain from her incisions. She states that since being advanced to solids she has had some belching after eating. The belching will last for several minutes. She also states that she had some phlebitis in her left upper arm from the IV placed during her hospitalization. She states the redness has resolved. She states that her energy level is still not back to baseline. She states that she tried walking on her treadmill yesterday for about 15-20 minutes and she was very fatigued afterwards  Review of Systems     Objective:   Physical Exam BP 126/82  Pulse 96  Temp(Src) 97.8 F (36.6 C) (Temporal)  Resp 16  Ht 5\' 5"  (1.651 m)  Wt 260 lb 6.4 oz (118.117 kg)  BMI 43.33 kg/m2  LMP 05/22/2013  Gen: alert, NAD, non-toxic appearing Pupils: equal, no scleral icterus Pulm: Lungs clear to auscultation, symmetric chest rise CV: regular rate and rhythm Abd: soft, nontender, nondistended. Well-healed trocar sites. No cellulitis. No incisional hernia Ext: no edema, no calf tenderness Skin: no rash, no jaundice     Assessment:     Status post laparoscopic radical sleeve gastrectomy with hiatal hernia  repair Hypertension Dyslipidemia Joint pain     Plan:     Her vital signs are good today. She is not tachycardic and afebrile. There is no sign of leak. I explained to her and her husband that her energy level may take a few more weeks to completely return to normal. It appears that she needs some reinforcement with respect to proper eating behaviors. It seems like she is not taking her time while eating and trying to eat too rapidly. We discussed proper it in techniques. We discussed the importance of taking one small bite at that time, shooting 20-30 times, and waiting 20-30 seconds between bites of food. We discussed strategies to help achieve this. She was encouraged to continue taking her multivitamins and supplements as prescribed in addition to her Protonix. We will keep her on Protonix for at least 8 weeks. I encouraged her to walk daily. It also appears that she may not be getting enough fluid intake. We rediscussed the importance of trying to get 64 ounces of water a day. I also encouraged her to take a mild laxative like MiraLAX or milk of magnesia to help with her constipation.  I congratulated her on her weight loss. She has lost approximately 18.4 pounds since surgery. Followup with me in 2 weeks  Mary Sella. Andrey Campanile, MD, FACS General, Bariatric, & Minimally Invasive Surgery [facility], [place]

## 2013-07-24 NOTE — Progress Notes (Signed)
Bariatric Class:  Appt start time: 1530 end time:  1630.  2 Week Post-Operative Nutrition Class  Patient was seen on 07/09/2013 for Post-Operative Nutrition [facility].   Surgery date: 06/24/2013 Surgery type: Sleeve Gastrectomy  Weight today: 261.0   TANITA  BODY COMP RESULTS  10/14/214   BMI (kg/m^2) 43.4   Fat Mass (lbs) 139.5   Fat Free Mass (lbs) 121.5   Total Body Water (lbs) 89.0   The following the learning objectives were met by the patient during this course:  Identifies Phase 3A (Soft, High Proteins) Dietary Goals and will begin from 2 weeks post-operatively to 2 months post-operatively  Identifies appropriate sources of fluids and proteins   States protein recommendations and appropriate sources post-operatively  Identifies the need for appropriate texture modifications, mastication, and bite sizes when consuming solids  Identifies appropriate multivitamin and calcium sources post-operatively  Describes the need for physical activity post-operatively and will follow MD recommendations  States when to call healthcare provider regarding medication questions or post-operative complications  Handouts given during class include:  Phase 3A: Soft, High Protein Diet Handout  Follow-Up Plan: Patient will follow-up at [facility] in 6 weeks for 8 week post-op nutrition visit for diet advancement per MD.

## 2013-07-24 NOTE — Patient Instructions (Signed)
Goals:  Follow Phase 3A: Soft High Protein Phase  Eat 3-6 small meals/snacks, every 3-5 hrs  Increase lean protein foods to meet 60g goal  Increase fluid intake to 64oz +  Avoid drinking 15 minutes before, during and 30 minutes after eating  Aim for >30 min of physical activity daily per MD 

## 2013-08-01 ENCOUNTER — Other Ambulatory Visit: Payer: Self-pay

## 2013-08-01 ENCOUNTER — Telehealth (INDEPENDENT_AMBULATORY_CARE_PROVIDER_SITE_OTHER): Payer: Self-pay | Admitting: General Surgery

## 2013-08-01 NOTE — Telephone Encounter (Signed)
Letter written and faxed to number provided per patient request. Confirmation received.

## 2013-08-01 NOTE — Telephone Encounter (Signed)
Message copied by Liliana Cline on Thu Aug 01, 2013  8:30 AM ------      Message from: Mervin Kung      Created: Wed Jul 31, 2013  2:08 PM      Contact: [phone]       Lesly Rubenstein,      Pt called she needs a note for work stating she will return to work on 08/07/2013. We can fax it to [phone] Att: Verlin Grills ------

## 2013-08-02 ENCOUNTER — Telehealth (INDEPENDENT_AMBULATORY_CARE_PROVIDER_SITE_OTHER): Payer: Self-pay | Admitting: *Deleted

## 2013-08-02 NOTE — Telephone Encounter (Signed)
Left message on machine for patient to call back and ask for me or triage. Per Dr Andrey Campanile patient needs to contact her PCP.

## 2013-08-02 NOTE — Telephone Encounter (Signed)
Patient called to report that she believes she has a UTI.  Patient had a lap gastric sleeve on 06/24/13.  I tried to explain to the patient that she probably should see her PMD for this since it is so far out from surgery however she asked me to send a message to Dr. Andrey Campanile to ask about it.  Explained that he would not be back available until Monday but I would send the message then we can let her know.  Patient states understanding and agreeable at this time.

## 2013-08-05 ENCOUNTER — Telehealth: Payer: Self-pay | Admitting: Dietician

## 2013-08-05 NOTE — Telephone Encounter (Signed)
Emailed Steward Drone the Phase III B bariatric diet with instructions to follow it starting at 8 weeks Post Op.

## 2013-08-08 ENCOUNTER — Ambulatory Visit: Payer: BC Managed Care – PPO | Admitting: *Deleted

## 2013-08-15 ENCOUNTER — Ambulatory Visit (INDEPENDENT_AMBULATORY_CARE_PROVIDER_SITE_OTHER): Payer: BC Managed Care – PPO | Admitting: General Surgery

## 2013-08-15 ENCOUNTER — Encounter (INDEPENDENT_AMBULATORY_CARE_PROVIDER_SITE_OTHER): Payer: Self-pay | Admitting: General Surgery

## 2013-08-15 ENCOUNTER — Ambulatory Visit
Admission: RE | Admit: 2013-08-15 | Discharge: 2013-08-15 | Disposition: A | Discharge status: Home / Self Care | Payer: BC Managed Care – PPO | Source: Ambulatory Visit | Attending: General Surgery | Admitting: General Surgery

## 2013-08-15 VITALS — BP 130/82 | HR 84 | Resp 16 | Ht 65.0 in | Wt 249.0 lb

## 2013-08-15 DIAGNOSIS — M25512 Pain in left shoulder: Secondary | ICD-10-CM

## 2013-08-15 DIAGNOSIS — Z09 Encounter for follow-up examination after completed treatment for conditions other than malignant neoplasm: Secondary | ICD-10-CM

## 2013-08-15 DIAGNOSIS — M25519 Pain in unspecified shoulder: Secondary | ICD-10-CM

## 2013-08-15 NOTE — Progress Notes (Signed)
Subjective:     Patient ID: Brenda Ware, female   DOB: Sep 25, 1966, 47 y.o.   MRN: [id number]  HPI  47 year old Caucasian female comes in for followup after undergoing a laparoscopic sleeve gastrectomy with hiatal hernia repair on September 29. Her last office visit was October 16 at which time she weighed 260 pounds. Her preoperative weight was 278 pounds. Since she was last seen she developed urinary discomfort and was diagnosed with a urinary tract infection and is currently on antibiotics. She denies any fever, chills, nausea, regurgitation or abdominal pain. 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 She is taking her supplements. She has had one or 2 episodes where she ate too rapidly and developed some pressure in her upper chest. She also relates some persistent left shoulder-scapular discomfort with activity. It only occurs with physical exertion. It does not occur after or during eating. She denies any lightheadedness or dizziness. She denies any melena or hematochezia.  Review of Systems     Objective:   Physical Exam BP 130/82  Pulse 84  Resp 16  Ht 5\' 5"  (1.651 m)  Wt 249 lb (112.946 kg)  BMI 41.44 kg/m2  Gen: alert, NAD, non-toxic appearing Pupils: equal, no scleral icterus Pulm: Lungs clear to auscultation, symmetric chest rise CV: regular rate and rhythm Abd: soft, nontender, nondistended. Well-healed trocar sites. No cellulitis. No incisional hernia Ext: no edema, Skin: no rash, no jaundice     Assessment:     Status post laparoscopic sleeve gastrectomy with hiatal hernia repair September 29     Plan:     Overall I believe she is doing well. Her vital signs are stable. Total weight loss since surgery has been approximately 29 pounds. We did discuss the fact that her weight loss would be higher at this point with additional exercise and activity. We discussed strategies to increase her activity level such as using a  pedometer and setting weekly goals to increase her daily step count. I encouraged her to continue with hypertonic as well as her supplements.  With respect to a left shoulder discomfort with physical activity my suspicion for a leak is very low so she is afebrile, not tachycardic, and looks well. She did have preoperative neck and shoulder issues. I recommended that we start with a left shoulder series x-ray. I also encouraged her to apply ice to her shoulder area as well as to avoid heavy lifting, pushing, and pulling for 2 weeks with her left upper extremity. If she develops additional symptoms not related to physical activity  I do think we need to get upper GI.   I will see her back in about 4-6 weeks Or sooner is she develops additional symptoms.  Mary Sella. Andrey Campanile, MD, FACS General, Bariatric, & Minimally Invasive Surgery [facility], [place]

## 2013-08-15 NOTE — Patient Instructions (Signed)
Call if shoulder pain worsens  Eating techniques 20-20-20 (30-30-30) 20 chews, 20 seconds between bites of food, 20 minutes to eat; sometimes you may need 30 chews, 30 seconds etc Use your nondominant hand to eat with Use a child/infant size utensil Try not to eat while watching TV  Walk every day and set weekly goals to increase your step count

## 2013-08-26 ENCOUNTER — Telehealth (INDEPENDENT_AMBULATORY_CARE_PROVIDER_SITE_OTHER): Payer: Self-pay | Admitting: General Surgery

## 2013-08-26 NOTE — Telephone Encounter (Signed)
Left message on machine for patient to call back and ask for me or triage. Patient never had shoulder Xray - calling to see if patient's left shoulder feels better. Awaiting call back.

## 2013-08-30 ENCOUNTER — Encounter: Payer: BC Managed Care – PPO | Attending: General Surgery | Admitting: Dietician

## 2013-08-30 ENCOUNTER — Ambulatory Visit
Admission: RE | Admit: 2013-08-30 | Discharge: 2013-08-30 | Disposition: A | Discharge status: Home / Self Care | Payer: BC Managed Care – PPO | Source: Ambulatory Visit | Attending: General Surgery | Admitting: General Surgery

## 2013-08-30 VITALS — Ht 65.0 in | Wt 242.5 lb

## 2013-08-30 DIAGNOSIS — Z01818 Encounter for other preprocedural examination: Secondary | ICD-10-CM | POA: Insufficient documentation

## 2013-08-30 DIAGNOSIS — Z713 Dietary counseling and surveillance: Secondary | ICD-10-CM | POA: Insufficient documentation

## 2013-08-30 NOTE — Progress Notes (Signed)
  Follow-up visit:  9 Weeks Post-Operative Sleeve Gastrecomy Surgery  Medical Nutrition Therapy:  Appt start time: 1030 end time:  1100.  Primary concerns today: Post-operative Bariatric Surgery Nutrition Management. Camesha returns with an 18.5 lb weight loss. Tried mashed potatoes and sweet potatoes on Thanksgiving without issue. Reports tolerating foods ok. Working on chewing foods and eating slowly.  Surgery date: 06/24/2013 Surgery type: Sleeve Gastrectomy Starting weight at [facility]: 275 lbs on 03/06/13  Weight today: 242.5 lbs Weight oss: 18.5 lbs, 17.5 fat loss Total weight loss: 32.5 lbs Weight loss goal: 130-140 lbs % Goal achieved: 22-24%   TANITA  BODY COMP RESULTS  10/14/214 08/30/13   BMI (kg/m^2) 43.4 40.4   Fat Mass (lbs) 139.5 122.0   Fat Free Mass (lbs) 121.5 120.5   Total Body Water (lbs) 89.0 88.0    Preferred Learning Style:   No preference indicated   Learning Readiness:   Ready  Change in progress  24-hr recall: B (AM): 5 oz Low Carb Slim Fat (10 g protein) Snk (10-11 AM): 5 oz Low Carb Slim Fat (10 g protein) or nothing L (PM): 1/4-1/2 [employer] chili or 3 oz chicken  (15-22 g protein) Snk (PM): sometimes chili  (10-15 g protein) D (PM): 3-4 oz protein with salad sometimes (21-28 g protein) Snk (PM): hot tea  Fluid intake: about 32 oz water + 4 oz hot tea + 10 oz protein shake (48 oz fluid)  Estimated total protein intake: 60-80 g protein  Medications: Protonix for one more day Supplementation: taking   Using straws: No Drinking while eating: No Hair loss: No Carbonated beverages: No N/V/D/C: Nausea sometimes though not since surgery, constipation sometimes Dumping syndrome: No  Recent physical activity:  Walks 3 x week for about 20 minutes  Progress Towards Goal(s):  In progress.   Nutritional Diagnosis:  [place]-3.3 Overweight/obesity related to past poor dietary habits and physical inactivity as evidenced by patient w/ recent sleeve  gastrectomy surgery following dietary guidelines for continued weight loss.    Intervention:  Nutrition counseling/diet advancement.  Teaching Method Utilized:  Visual Auditory   Barriers to learning/adherence to lifestyle change: none  Demonstrated degree of understanding via:  Teach Back   Monitoring/Evaluation:  Dietary intake, exercise, lap band fills, and body weight. Follow up in 6 weeks 3.5 month post-op visit.

## 2013-08-30 NOTE — Patient Instructions (Addendum)
Goals:  Follow Phase 3B: High Protein + Non-Starchy Vegetables  Eat 3-6 small meals/snacks, every 3-5 hrs  Increase lean protein foods to meet 60g goal  Increase fluid intake to 64oz +  Avoid drinking 15 minutes before, during and 30 minutes after eating  Aim for >30 min of physical activity daily  

## 2013-09-27 ENCOUNTER — Ambulatory Visit (INDEPENDENT_AMBULATORY_CARE_PROVIDER_SITE_OTHER): Payer: BC Managed Care – PPO | Admitting: General Surgery

## 2013-10-03 ENCOUNTER — Telehealth (INDEPENDENT_AMBULATORY_CARE_PROVIDER_SITE_OTHER): Payer: Self-pay | Admitting: General Surgery

## 2013-10-03 NOTE — Telephone Encounter (Signed)
LMOM asking pt to return my call and give Brenda Ware an update on how she is feeling.  I also wanted to see if she would like to reschedule her bariatric appointment with Dr. Andrey CampanileWilson.

## 2013-10-14 ENCOUNTER — Encounter: Payer: BC Managed Care – PPO | Attending: General Surgery | Admitting: Dietician

## 2013-10-14 VITALS — Ht 65.0 in | Wt 229.0 lb

## 2013-10-14 DIAGNOSIS — Z713 Dietary counseling and surveillance: Secondary | ICD-10-CM | POA: Insufficient documentation

## 2013-10-14 DIAGNOSIS — Z01818 Encounter for other preprocedural examination: Secondary | ICD-10-CM | POA: Insufficient documentation

## 2013-10-14 NOTE — Patient Instructions (Addendum)
Goals:  Follow Phase 3B: High Protein + Non-Starchy Vegetables  Eat 3-6 small meals/snacks, every 3-5 hrs  Increase lean protein foods to meet 60g goal  Increase fluid intake to 64oz +  Avoid drinking 15 minutes before, during and 30 minutes after eating  Aim for >30 min of physical activity daily  For breakfast, try Dannon Light and Fit Yogurt or [place] Protein or Fiber Plus Protein  Limit carbs to 15 g per meal or snack

## 2013-10-14 NOTE — Progress Notes (Signed)
  Follow-up visit:  12.5 Weeks Post-Operative Sleeve Gastrecomy Surgery  Medical Nutrition Therapy:  Appt start time: 1045 end time:  1115.  Primary concerns today: Post-operative Bariatric Surgery Nutrition Management. Brenda Ware returns with an 13.5 lbs loss. Tried soda one time and it "hurt her stomach". Tolerating all other foods.   Surgery date: 06/24/2013 Surgery type: Sleeve Gastrectomy [facility] lbs on 03/06/13  Weight today: 229.0 lbs  Weight loss: 13.5 lbs Total weight loss: 46 lbs Weight loss goal: 130-140 lbs % Goal achieved: 32-34%   TANITA  BODY COMP RESULTS  10/14/214 08/30/13 10/14/13   BMI (kg/m^2) 43.4 40.4 38.1   Fat Mass (lbs) 139.5 122.0 114.5   Fat Free Mass (lbs) 121.5 120.5 114.5   Total Body Water (lbs) 89.0 88.0 84.0    Preferred Learning Style:   No preference indicated   Learning Readiness:   Ready  Change in progress  24-hr recall: B (AM): 5 oz Low Carb Slim Fat (10 g protein) Snk (10-11 AM): 5 oz Low Carb Slim Fat (10 g protein) or nothing L (PM): 1/4-1/2 [employer] chili or 3 oz chicken or 1/2 salad with chicken and cheese  (15-22 g protein) Snk (PM): sometimes chili (10-15 g protein) or nothing or sugar free candy  D (PM): 3-4 oz protein with salad sometimes or spinach and artichoke dip with peppers (21-28 g protein) Snk (PM): hot tea (13 g CHO)  sometimes  Fluid intake: about 20 oz water + 4 oz hot tea + 10 oz protein shake (34 oz fluid)  Estimated total protein intake: 60-80 g protein  Medications: Protonix for one more day Supplementation: taking   Using straws: No Drinking while eating: No Hair loss: No, started taking biotin Carbonated beverages: tried one soda and felt sick N/V/D/C: Nausea sometimes, constipation sometimes Dumping syndrome: No  Recent physical activity:  Walks "off and on" started [employer] program  Progress Towards Goal(s):  In progress.   Nutritional Diagnosis:  [place]-3.3 Overweight/obesity related  to past poor dietary habits and physical inactivity as evidenced by patient w/ recent sleeve gastrectomy surgery following dietary guidelines for continued weight loss.    Intervention:  Nutrition counseling/diet advancement.  Teaching Method Utilized:  Visual Auditory   Barriers to learning/adherence to lifestyle change: none  Demonstrated degree of understanding via:  Teach Back   Monitoring/Evaluation:  Dietary intake, exercise, and body weight. Follow up in 3 months for a 6 month post-op visit.

## 2013-10-21 ENCOUNTER — Encounter: Payer: Self-pay | Admitting: Family Medicine

## 2013-10-21 ENCOUNTER — Ambulatory Visit (INDEPENDENT_AMBULATORY_CARE_PROVIDER_SITE_OTHER): Payer: BC Managed Care – PPO | Admitting: Family Medicine

## 2013-10-21 VITALS — BP 131/79 | HR 79 | Temp 97.0°F | Ht 65.0 in | Wt 227.0 lb

## 2013-10-21 DIAGNOSIS — N390 Urinary tract infection, site not specified: Secondary | ICD-10-CM | POA: Insufficient documentation

## 2013-10-21 DIAGNOSIS — R300 Dysuria: Secondary | ICD-10-CM

## 2013-10-21 LAB — POCT URINALYSIS DIPSTICK
Glucose, UA: NEGATIVE
Ketones, UA: 15
Leukocytes, UA: NEGATIVE
Nitrite, UA: POSITIVE
Protein, UA: NEGATIVE
RBC UA: NEGATIVE
UROBILINOGEN UA: 0.2
pH, UA: 5.5

## 2013-10-21 LAB — POCT UA - MICROSCOPIC ONLY: Epithelial cells, urine per micros: >20

## 2013-10-21 MED ORDER — CEPHALEXIN 500 MG PO CAPS: 500.0000 mg | ORAL_CAPSULE | Freq: Three times a day (TID) | ORAL | Status: DC

## 2013-10-21 NOTE — Patient Instructions (Signed)
I will call you if the antibiotic choice changes.  Please drink as much water as the surgery center has allowed you.    Urinary Tract Infection Urinary tract infections (UTIs) can develop anywhere along your urinary tract. Your urinary tract is your body's drainage system for removing wastes and extra water. Your urinary tract includes two kidneys, two ureters, a bladder, and a urethra. Your kidneys are a pair of bean-shaped organs. Each kidney is about the size of your fist. They are located below your ribs, one on each side of your spine. CAUSES Infections are caused by microbes, which are microscopic organisms, including fungi, viruses, and bacteria. These organisms are so small that they can only be seen through a microscope. Bacteria are the microbes that most commonly cause UTIs. SYMPTOMS  Symptoms of UTIs may vary by age and gender of the patient and by the location of the infection. Symptoms in young women typically include a frequent and intense urge to urinate and a painful, burning feeling in the bladder or urethra during urination. Older women and men are more likely to be tired, shaky, and weak and have muscle aches and abdominal pain. A fever may mean the infection is in your kidneys. Other symptoms of a kidney infection include pain in your back or sides below the ribs, nausea, and vomiting. DIAGNOSIS To diagnose a UTI, your caregiver will ask you about your symptoms. Your caregiver also will ask to provide a urine sample. The urine sample will be tested for bacteria and white blood cells. White blood cells are made by your body to help fight infection. TREATMENT  Typically, UTIs can be treated with medication. Because most UTIs are caused by a bacterial infection, they usually can be treated with the use of antibiotics. The choice of antibiotic and length of treatment depend on your symptoms and the type of bacteria causing your infection. HOME CARE INSTRUCTIONS  If you were  prescribed antibiotics, take them exactly as your caregiver instructs you. Finish the medication even if you feel better after you have only taken some of the medication.  Drink enough water and fluids to keep your urine clear or pale yellow.  Avoid caffeine, tea, and carbonated beverages. They tend to irritate your bladder.  Empty your bladder often. Avoid holding urine for long periods of time.  Empty your bladder before and after sexual intercourse.  After a bowel movement, women should cleanse from front to back. Use each tissue only once. SEEK MEDICAL CARE IF:   You have back pain.  You develop a fever.  Your symptoms do not begin to resolve within 3 days. SEEK IMMEDIATE MEDICAL CARE IF:   You have severe back pain or lower abdominal pain.  You develop chills.  You have nausea or vomiting.  You have continued burning or discomfort with urination. MAKE SURE YOU:   Understand these instructions.  Will watch your condition.  Will get help right away if you are not doing well or get worse. Document Released: 06/22/2005 Document Revised: 03/13/2012 Document Reviewed: 10/21/2011 [facility] Patient Information 2014 [place] [place].

## 2013-10-21 NOTE — Progress Notes (Signed)
Patient ID: Brenda Ware Ware    DOB: 10/11/1965, 48 y.o.   MRN: [id number] --- Subjective:  Brenda Ware Ware is a 48 y.o.female who presents for evaluation of dysuria. Patient reports dysuria and mild back pain for the last week. She has been taking AZO for the last 3 days which helped but did not make it completely go away. She denies any abdominal pain, any nausea, any vomiting. Her appetite and intake have not changed since symptoms started. She does report that since her bariatric surgery, she has not been drinking as much water as she used to prior to the surgery. She reports not urinating as much as usual. She has had 1 other UTI since her surgery sometime last year.    ROS: see HPI Past Medical History: reviewed and updated medications and allergies. Social History: Tobacco: former smoker  Objective: Filed Vitals:   10/21/13 1120  BP: 131/79  Pulse: 79  Temp: 97 F (36.1 C)    Physical Examination:   General appearance - alert, well appearing, and in no distress Abdomen: soft, non tender, non distended, no CVA tenderness Lungs: CTA b/l, normal work of breathing CV: s1s2, RRR, no murmur

## 2013-10-21 NOTE — Assessment & Plan Note (Signed)
recurent UTI since her surgery last year.  Nitrites on dipstick making UTI likely. Moreover, will treat according to symptoms.  - keflex tid (did not choose cipro due to allergy to avleox and did not choose bactrim because patient reported history of UTI a few months ago which was not sensitive to a very common antibiotic per patient.  If doesn't tolrate it, would try other antibiotic - wait for sensitivities of culture - encouraged more fluids as this is likely contributing to her symptoms - return to care sooner reviewed

## 2013-10-23 ENCOUNTER — Encounter: Payer: Self-pay | Admitting: Family Medicine

## 2013-10-23 ENCOUNTER — Telehealth: Payer: Self-pay | Admitting: Family Medicine

## 2013-10-23 LAB — URINE CULTURE

## 2013-10-23 NOTE — Telephone Encounter (Signed)
LMVM for patient to please return our call.  See MD message below when pt calls back.  Traycen Goyer, Darlyne RussianKristen L, CMA

## 2013-10-23 NOTE — Telephone Encounter (Signed)
Please let patient know that her urine culture showed E-coli infection, sensitive to the Keflex antibiotic that was prescribed. She needs to be finish the course of antibiotics.  Thank you!  Brenda ChancyStephanie Anwitha Mapes, PGY-3 Family Medicine Resident

## 2013-11-07 ENCOUNTER — Encounter (INDEPENDENT_AMBULATORY_CARE_PROVIDER_SITE_OTHER): Payer: Self-pay | Admitting: General Surgery

## 2013-11-07 ENCOUNTER — Ambulatory Visit (INDEPENDENT_AMBULATORY_CARE_PROVIDER_SITE_OTHER): Payer: BC Managed Care – PPO | Admitting: General Surgery

## 2013-11-07 ENCOUNTER — Other Ambulatory Visit (INDEPENDENT_AMBULATORY_CARE_PROVIDER_SITE_OTHER): Payer: Self-pay | Admitting: General Surgery

## 2013-11-07 VITALS — BP 120/82 | HR 68 | Temp 97.8°F | Resp 14 | Ht 65.0 in | Wt 225.4 lb

## 2013-11-07 DIAGNOSIS — E669 Obesity, unspecified: Secondary | ICD-10-CM

## 2013-11-07 DIAGNOSIS — Z9884 Bariatric surgery status: Secondary | ICD-10-CM

## 2013-11-07 LAB — FOLATE

## 2013-11-07 LAB — CBC WITH DIFFERENTIAL/PLATELET
Basophils Absolute: 0 10*3/uL (ref 0.0–0.1)
Basophils Relative: 1 % (ref 0–1)
EOS ABS: 0.1 10*3/uL (ref 0.0–0.7)
EOS PCT: 1 % (ref 0–5)
HCT: 39.6 % (ref 36.0–46.0)
Hemoglobin: 13.4 g/dL (ref 12.0–15.0)
LYMPHS PCT: 35 % (ref 12–46)
Lymphs Abs: 2.7 10*3/uL (ref 0.7–4.0)
MCH: 30 pg (ref 26.0–34.0)
MCHC: 33.8 g/dL (ref 30.0–36.0)
MCV: 88.6 fL (ref 78.0–100.0)
MONOS PCT: 6 % (ref 3–12)
Monocytes Absolute: 0.5 10*3/uL (ref 0.1–1.0)
Neutro Abs: 4.6 10*3/uL (ref 1.7–7.7)
Neutrophils Relative %: 57 % (ref 43–77)
PLATELETS: 338 10*3/uL (ref 150–400)
RBC: 4.47 MIL/uL (ref 3.87–5.11)
RDW: 14.1 % (ref 11.5–15.5)
WBC: 7.9 10*3/uL (ref 4.0–10.5)

## 2013-11-07 LAB — CMP AND LIVER
ALK PHOS: 47 U/L (ref 39–117)
ALT: 17 U/L (ref 0–35)
AST: 18 U/L (ref 0–37)
Albumin: 3.9 g/dL (ref 3.5–5.2)
BUN: 15 mg/dL (ref 6–23)
Bilirubin, Direct: 0.1 mg/dL (ref 0.0–0.3)
CALCIUM: 9.5 mg/dL (ref 8.4–10.5)
CHLORIDE: 105 meq/L (ref 96–112)
CO2: 27 mEq/L (ref 19–32)
CREATININE: 0.83 mg/dL (ref 0.50–1.10)
Glucose, Bld: 85 mg/dL (ref 70–99)
Indirect Bilirubin: 0.3 mg/dL (ref 0.2–1.2)
POTASSIUM: 4.6 meq/L (ref 3.5–5.3)
Sodium: 139 mEq/L (ref 135–145)
Total Bilirubin: 0.4 mg/dL (ref 0.2–1.2)
Total Protein: 6.6 g/dL (ref 6.0–8.3)

## 2013-11-07 LAB — IRON AND TIBC
%SAT: 25 % (ref 20–55)
IRON: 66 ug/dL (ref 42–145)
TIBC: 268 ug/dL (ref 250–470)
UIBC: 202 ug/dL (ref 125–400)

## 2013-11-07 LAB — MAGNESIUM: Magnesium: 1.9 mg/dL (ref 1.5–2.5)

## 2013-11-07 NOTE — Patient Instructions (Signed)
Work on tracking your daily steps with a pedometer and setting weekly goals. Put pedometer next to car keys Walk 4 times a week at a minimum Please get your labs drawn

## 2013-11-07 NOTE — Progress Notes (Signed)
Subjective:     Patient ID: Brenda Ware, female   DOB: Aug 30, 1966, 48 y.o.   MRN: [id number]  HPI 48 year old Caucasian female comes in for followup after undergoing laparoscopic sleeve gastrectomy and hiatal hernia repair on 06/24/2013. I last saw her in the office on November 20. At that time her weight was 249 pounds. She states that she has been doing well. The left shoulder discomfort that she had at her last point has resolved. She denies any fever, chills, abdominal pain, diarrhea or constipation. She denies any paresthesias. She denies any hair loss however she is taking biotin. She is walking about 3 times a week anywhere from 15-30 minutes. She does have a pedometer but is not using it. She recently downloaded my fitness pal. She is averaging about 900 calories a day. She is getting around 60 g of protein daily. She states that she is continuing to take her Zantac if not she does have some reflux. She does report less hunger during the daytime. She reports smaller portion satisfy her. She recently saw the2 nutritionist last month.She reports that she is taking her multivitamin supplements as instructed  PMHx, PSHx, SOCHx, FAMHx, ALL reviewed and unchanged  Review of Systems 12point ROS performed and negative except as above    Objective:   Physical Exam BP 120/82  Pulse 68  Temp(Src) 97.8 F (36.6 C) (Oral)  Resp 14  Ht 5\' 5"  (1.651 m)  Wt 225 lb 6.4 oz (102.241 kg)  BMI 37.51 kg/m2  LMP 09/30/2013  Gen: alert, NAD, non-toxic appearing Pupils: equal, no scleral icterus Pulm: Lungs clear to auscultation, symmetric chest rise CV: regular rate and rhythm Abd: soft, nontender, nondistended. Well-healed trocar sites. No cellulitis. No incisional hernia Ext: no edema, no calf tenderness Skin: no rash, no jaundice     Assessment:     Status post laparoscopic sleeve gastrectomy with hiatal hernia repair Stress urinary incontinence-improved Joint  pain-improved Gastroesophageal reflux disease-current Obesity     Plan:     Today she has lost approximately 52.6 pounds since surgery. I congratulated her on her weight loss. However we did discuss the importance of routine structured exercise. I encouraged her to start using her pedometer on a daily basis and setting weekly daily step goals. We discussed the importance of proper food choices. We discussed staying away from soups. She was encouraged to continue to use MyFitnessPal. She is due for lab surveillance. Followup with me in 3 months  Mary Sellaric M. Andrey CampanileWilson, MD, FACS General, Bariatric, & Minimally Invasive Surgery [facility], [place]

## 2013-11-08 LAB — VITAMIN D 25 HYDROXY (VIT D DEFICIENCY, FRACTURES): Vit D, 25-Hydroxy: 54 ng/mL (ref 30–89)

## 2014-01-13 ENCOUNTER — Encounter: Payer: BC Managed Care – PPO | Attending: General Surgery | Admitting: Dietician

## 2014-01-13 VITALS — Ht 65.0 in | Wt 219.0 lb

## 2014-01-13 DIAGNOSIS — Z9884 Bariatric surgery status: Secondary | ICD-10-CM | POA: Insufficient documentation

## 2014-01-13 DIAGNOSIS — Z713 Dietary counseling and surveillance: Secondary | ICD-10-CM | POA: Insufficient documentation

## 2014-01-13 DIAGNOSIS — Z6836 Body mass index (BMI) 36.0-36.9, adult: Secondary | ICD-10-CM | POA: Insufficient documentation

## 2014-01-13 DIAGNOSIS — E669 Obesity, unspecified: Secondary | ICD-10-CM | POA: Insufficient documentation

## 2014-01-13 DIAGNOSIS — Z9889 Other specified postprocedural states: Secondary | ICD-10-CM | POA: Insufficient documentation

## 2014-01-13 NOTE — Patient Instructions (Addendum)
Goals:  Follow Phase 3B: High Protein + Non-Starchy Vegetables  Eat 3-6 small meals/snacks, every 3-5 hrs  Increase lean protein foods to meet 60g goal  Increase fluid intake to 64oz +  Avoid drinking 15 minutes before, during and 30 minutes after eating  Aim for >30 min of physical activity daily  Limit carbs to 15 g per meal or snack  Think about adding protein for snacks instead of sugar free candy

## 2014-01-13 NOTE — Progress Notes (Signed)
  Follow-up visit:  7 Months Post-Operative Sleeve Gastrecomy Surgery  Medical Nutrition Therapy:  Appt start time: 1015 end time:  1030.  Primary concerns today: Post-operative Bariatric Surgery Nutrition Management. Steward DroneBrenda returns with an 10 lbs loss. Tolerating all other foods. Having some cramps in her feet but this is not new.   Surgery date: 06/24/2013 Surgery type: Sleeve Gastrectomy [facility] on 03/06/13  Weight today:219.0 lbs  Weight loss: 10 lbs Total weight loss: 56 lbs Weight loss goal: 130-140 lbs % Goal achieved: 38-41%   TANITA  BODY COMP RESULTS  10/14/214 08/30/13 10/14/13 01/13/14   BMI (kg/m^2) 43.4 40.4 38.1 36.4   Fat Mass (lbs) 139.5 122.0 114.5 101.0   Fat Free Mass (lbs) 121.5 120.5 114.5 118.0   Total Body Water (lbs) 89.0 88.0 84.0 86.5    Preferred Learning Style:   No preference indicated   Learning Readiness:   Ready  24-hr recall: B (AM): 5 oz Low Carb Slim Fast (10 g protein) on weekend 1 egg with bacon/sausage with cheese Snk (10-11 AM): 5 oz Low Carb Slim Fast (10 g protein)  L (PM): 1/2 salad with chicken and cheese  (10 g protein) Snk (PM): sugar free candy  D (PM): 2 oz baked fish with coleslaw (14 g protein) Snk (PM): hot tea or sugar free hot chocolate (13 g CHO)  sometimes  Fluid intake: about 32-40 oz water or crystal light  + 4 oz hot tea + 10 oz protein shake (46-54 oz fluid)  Estimated total protein intake: 44-60 g protein  Medications: see list Supplementation: taking   Using straws: No Drinking while eating: tries not to (maybe a sip) Hair loss: No, started taking biotin Carbonated beverages: No N/V/D/C: constipation sometimes, will take probiotics Dumping syndrome: No  Recent physical activity:  3 x week 45-60 walk, bike, and weights  Progress Towards Goal(s):  In progress.   Nutritional Diagnosis:  [place]-3.3 Overweight/obesity related to past poor dietary habits and physical inactivity as  evidenced by patient w/ recent sleeve gastrectomy surgery following dietary guidelines for continued weight loss.    Intervention:  Nutrition counseling/diet enforcement.  Teaching Method Utilized:  Visual Auditory Hands on  Barriers to learning/adherence to lifestyle change: none  Demonstrated degree of understanding via:  Teach Back   Monitoring/Evaluation:  Dietary intake, exercise, and body weight. Follow up in 3 months for a 10 month post-op visit.

## 2014-03-04 ENCOUNTER — Encounter: Payer: Self-pay | Admitting: Family Medicine

## 2014-03-04 ENCOUNTER — Ambulatory Visit (INDEPENDENT_AMBULATORY_CARE_PROVIDER_SITE_OTHER): Payer: BC Managed Care – PPO | Admitting: Family Medicine

## 2014-03-04 VITALS — BP 94/64 | HR 69 | Ht 65.0 in | Wt 211.0 lb

## 2014-03-04 DIAGNOSIS — M542 Cervicalgia: Secondary | ICD-10-CM | POA: Insufficient documentation

## 2014-03-04 MED ORDER — CYCLOBENZAPRINE HCL 5 MG PO TABS: 5.0000 mg | ORAL_TABLET | Freq: Every day | ORAL | Status: DC

## 2014-03-04 NOTE — Patient Instructions (Signed)
It was a pleasure to see you today.  I believe the right-sided neck pain is muscular in origin. Because of your gastric sleeve surgery, it is not recommended to use anti-inflammatory medications on a regular basis.   I recommend the use of Tylenol 650mg  by mouth, every 4 to 6 hours scheduled for the next 3 to 5 days. Warm compresses frequently throughout the day.   I sent in a prescription for a mild muscle relaxant medication, CYCLOBENZAPRINE 5mg , you may take 1 tablet at bedtime for 5 to 7 consecutive days if the pain does not respond to Tylenol.  Will cause sedation, should not be taken with alcohol.  Follow up in the coming 2 weeks with your primary physician.

## 2014-03-06 NOTE — Progress Notes (Signed)
   Subjective:    Patient ID: Brenda Ware, female    DOB: 1966-01-26, 48 y.o.   MRN: [id number]  HPI Patient here for SDA for pain under her R ear, along her neck, as well as focal area of pain under her chin along the midline.  Onset on June 5 in the evening while sitting at table. Not associated with chewing or eating. No trauma.  Has been off and on since that time. Has also had some mild frontal headaches since that time, intermittently. On the day of this visit with pain in the R ear as well. No tinnitus or changes in auditory acuity. No suppuration from the ear. No fevers or chills, no cough. No visual changes. Took one dose of Tylenol one time since onset on June 5, no real change.     Review of Systems     Objective:   Physical Exam Well appearing, no apparent distress HEENT Neck supple. No pain to palpate pinna/tragus on either ear.  No periauricular adenopathy or ant/posterior cervical adenopathy. No frontal or maxillary sinus tenderness.  TMs clear bilaterally, with good cones of light. No pain along TMJ bilaterally. Able to open mouth fully without pain.  Tenderness along musculature below angle of mandible on the R side. No carotid bruits heard on either side. Full active and passive ROM of neck in all planes. Thyroid supple and without nodularity. Small focal area of tenderness along submental area in midline.   COR Regular S1S2 PULM Clear bilaterally.        Assessment & Plan:

## 2014-03-06 NOTE — Assessment & Plan Note (Signed)
Below ear; intermittent. Onset February 28, 2014 while sitting at table.  Not associated with chewing, is associated with frontal headache without photophobia. S/p gastric sleeve surgery Sept 2014 which limits use of NSAIDs.   Plan to schedule use of Tylenol; non-pharm interventions (heat packs) and recheck in short interval. Patient agrees with plan.

## 2014-03-27 ENCOUNTER — Encounter (INDEPENDENT_AMBULATORY_CARE_PROVIDER_SITE_OTHER): Payer: Self-pay | Admitting: General Surgery

## 2014-03-27 ENCOUNTER — Ambulatory Visit (INDEPENDENT_AMBULATORY_CARE_PROVIDER_SITE_OTHER): Payer: BC Managed Care – PPO | Admitting: General Surgery

## 2014-03-27 VITALS — BP 124/76 | HR 80 | Temp 97.1°F | Ht 65.0 in | Wt 211.0 lb

## 2014-03-27 DIAGNOSIS — Z9884 Bariatric surgery status: Secondary | ICD-10-CM

## 2014-03-27 DIAGNOSIS — E669 Obesity, unspecified: Secondary | ICD-10-CM

## 2014-03-27 NOTE — Progress Notes (Signed)
Subjective:     Patient ID: Brenda Ware, female   DOB: 1966/06/06, 48 y.o.   MRN: [id number]  HPI 48 year old female comes in for long-term followup after undergoing laparoscopic sleeve gastrectomy and hiatal hernia repair on 06/14/2013. I last saw her in the office in February. At that time her weight was 225 pounds. She states that she has been doing very well. She denies any abdominal pain, nausea, vomiting, reflux, diarrhea or constipation. She reports no food intolerance. She reports that she is taking her supplements occasionally she may have difficulty getting in all of her calcium. She is exercising 3 times a week at the gym for about one hour doing a combination of cardio and weight lifting. She is also walking several times a week with her daughter to recently underwent weight loss surgery as well. She did have some transient right-sided neck pain which has resolved. She denies any paresthesias. She denies any hair thinning her hair loss.  PMHx, PSHx, SOCHx, FAMHx, ALL reviewed and unchanged  Review of Systems A 12  point Review of systems was performed and all systems are negative except for what is mentioned in the history of present illness     Objective:   Physical Exam BP 124/76  Pulse 80  Temp(Src) 97.1 F (36.2 C)  Ht 5\' 5"  (1.651 m)  Wt 211 lb (95.709 kg)  BMI 35.11 kg/m2  Gen: alert, NAD, non-toxic appearing Pupils: equal, no scleral icterus Pulm: Lungs clear to auscultation, symmetric chest rise CV: regular rate and rhythm Abd: soft, nontender, nondistended. Well-healed trocar sites. No cellulitis. No incisional hernia Ext: no edema, no calf tenderness Skin: no rash, no jaundice     Assessment:     Status post laparoscopic sleeve gastrectomy with hiatal hernia repair 06/14/2013 Joint pain-improved Gastroesophageal reflux disease-resolved     Plan:     Overall I think she is doing very well. Her preoperative weight was 278 pounds. Weight loss to date has  been 67 pounds. Her preoperative BMI is 46. Her BMI today is 35!  I encouraged her to keep up with going to the gym several times a week. We discussed incorporating interval training into her workout.  I reviewed the lab work that she had done in February which was all within normal limits.  We discussed the importance of continuing to take supplements on a daily basis.   Followup in September for her one-year anniversary. We will check labs at that time  Mary Sellaric M. Andrey CampanileWilson, MD, FACS General, Bariatric, & Minimally Invasive Surgery [facility], [place]

## 2014-04-14 ENCOUNTER — Encounter: Payer: BC Managed Care – PPO | Attending: General Surgery | Admitting: Dietician

## 2014-04-14 VITALS — Ht 65.0 in | Wt 211.5 lb

## 2014-04-14 DIAGNOSIS — E669 Obesity, unspecified: Secondary | ICD-10-CM | POA: Diagnosis present

## 2014-04-14 DIAGNOSIS — Z713 Dietary counseling and surveillance: Secondary | ICD-10-CM | POA: Diagnosis not present

## 2014-04-14 DIAGNOSIS — Z6835 Body mass index (BMI) 35.0-35.9, adult: Secondary | ICD-10-CM | POA: Diagnosis not present

## 2014-04-14 DIAGNOSIS — Z9884 Bariatric surgery status: Secondary | ICD-10-CM | POA: Diagnosis not present

## 2014-04-14 NOTE — Progress Notes (Signed)
  Follow-up visit:  10 Months Post-Operative Sleeve Gastrecomy Surgery  Medical Nutrition Therapy:  Appt start time: 1105 end time:  1135.  Primary concerns today: Post-operative Bariatric Surgery Nutrition Management. Steward DroneBrenda returns with an 7.5 lbs loss. Tolerating all foods. Has been having a lot of nuts, cheese, and other higher carb/calorie snack foods. Weight has been at a plateau for awhile.    Surgery date: 06/24/2013 Surgery type: Sleeve Gastrectomy [facility] on [facility]06/13  Weight today:211.5 lbs  Weight loss: 7.5 lbs Total weight loss: 63.5 lbs Weight loss goal: 130-140 lbs % Goal achieved: 38-41%   TANITA  BODY COMP RESULTS  10/14/214 08/30/13 10/14/13 01/13/14 04/14/14   BMI (kg/m^2) 43.4 40.4 38.1 36.4 35.2   Fat Mass (lbs) 139.5 122.0 114.5 101.0 97.5   Fat Free Mass (lbs) 121.5 120.5 114.5 118.0 114.0   Total Body Water (lbs) 89.0 88.0 84.0 86.5 83.5    Preferred Learning Style:   No preference indicated   Learning Readiness:   Ready  24-hr recall: B (AM): 11 oz Low Carb Slim Fast (20 g protein)  Snk (10-11 AM): none or cashews L (PM): 1/2 salad with chicken Caesar and cheese  (16 g protein) Snk (PM): sugar free candy (peanut butter cup) sometimes  D (PM): 3 oz baked fish with coleslaw (21 g protein) or Quarter Pounder without bun Snk (PM): hot tea or diet hot chocolate (4 g CHO) with Reddi Whip sometimes  Fluid intake: about 32-40 oz water or crystal light  + 4 oz hot tea + 10 oz protein shake (almost 64 oz fluid)  Estimated total protein intake: ~ 60 g protein  Medications: see list Supplementation: taking   Using straws: No Drinking while eating: tries not to (maybe a sip) Hair loss: No, taking biotin Carbonated beverages: one every 3 months (Diet [facility]) N/V/D/C: had some nausea after a protein shake, constipation sometimes, will take probiotics Dumping syndrome: No  Recent physical activity:  3 x week 45-60 walk, bike, and  weights  Progress Towards Goal(s):  In progress.   Nutritional Diagnosis:  [place]-3.3 Overweight/obesity related to past poor dietary habits and physical inactivity as evidenced by patient w/ recent sleeve gastrectomy surgery following dietary guidelines for continued weight loss.    Intervention:  Nutrition counseling/diet enforcement.  Teaching Method Utilized:  Visual Auditory Hands on  Barriers to learning/adherence to lifestyle change: none  Demonstrated degree of understanding via:  Teach Back   Monitoring/Evaluation:  Dietary intake, exercise, and body weight. Follow up in 3 months for a 13 month post-op visit.

## 2014-04-14 NOTE — Patient Instructions (Addendum)
Goals:  Follow Phase 3B: High Protein + Non-Starchy Vegetables  Eat 3-6 small meals/snacks, every 3-5 hrs  Increase lean protein foods to meet 60g goal  Increase fluid intake to 64oz +  Avoid drinking 15 minutes before, during and 30 minutes after eating  Aim for >30 min of physical activity daily  Limit carbs to 15 g per meal or snack  Aim to get between 700-900 calories per day.  Try drinking only water for 2 weeks, then have 1 "sweet drink" per week   Choose lean/low fat protein for snacks  Eat protein first, then non starchy vegetables, and limit extra carbs for now

## 2014-06-17 ENCOUNTER — Encounter: Payer: BC Managed Care – PPO | Attending: General Surgery | Admitting: Dietician

## 2014-06-17 VITALS — Ht 65.0 in | Wt 206.0 lb

## 2014-06-17 DIAGNOSIS — Z6835 Body mass index (BMI) 35.0-35.9, adult: Secondary | ICD-10-CM | POA: Diagnosis not present

## 2014-06-17 DIAGNOSIS — Z9884 Bariatric surgery status: Secondary | ICD-10-CM | POA: Diagnosis not present

## 2014-06-17 DIAGNOSIS — E669 Obesity, unspecified: Secondary | ICD-10-CM | POA: Insufficient documentation

## 2014-06-17 DIAGNOSIS — Z713 Dietary counseling and surveillance: Secondary | ICD-10-CM | POA: Diagnosis not present

## 2014-06-17 NOTE — Patient Instructions (Addendum)
Goals:  Follow Phase 3B: High Protein + Non-Starchy Vegetables  Eat 3-6 small meals/snacks, every 3-5 hrs  Increase lean protein foods to meet 60g goal  Increase fluid intake to 64oz +  Avoid drinking 15 minutes before, during and 30 minutes after eating  Aim for >30 min of physical activity daily, think about doing some weights  Limit carbs to 15 g per meal or snack  Eat protein first, then non starchy vegetables, and limit extra carbs

## 2014-06-17 NOTE — Progress Notes (Signed)
  Follow-up visit:  12 Months Post-Operative Sleeve Gastrecomy Surgery  Medical Nutrition Therapy:  Appt start time: 1015 end time:  1035.  Primary concerns today: Post-operative Bariatric Surgery Nutrition Management. Brenda Ware returns with an 5.5 lbs loss. Has been cutting back on some the "junk" she was eating and added more walking.   Tolerating all foods. Has been having a lot of nuts, cheese, and other higher carb/calorie snack foods. Weight has been at a plateau for awhile.    Surgery date: 06/24/2013 Surgery type: Sleeve Gastrectomy [facility] weight at [facility]: 275 lbs on 03/06/13  Weight today:206.0 lbs  Weight loss: 5.5 lbs Total weight loss: 69 lbs Weight loss goal: 130-140 lbs % Goal achieved: 48-51%   TANITA  BODY COMP RESULTS  10/14/214 08/30/13 10/14/13 01/13/14 04/14/14 06/17/14   BMI (kg/m^2) 43.4 40.4 38.1 36.4 35.2 34.3   Fat Mass (lbs) 139.5 122.0 114.5 101.0 97.5 94.0   Fat Free Mass (lbs) 121.5 120.5 114.5 118.0 114.0 112.0   Total Body Water (lbs) 89.0 88.0 84.0 86.5 83.5 82.0    Preferred Learning Style:   No preference indicated   Learning Readiness:   Ready  24-hr recall: B (AM): egg or fruit (0-6 g) Snk (10-11 AM): none or chicken salad (18 g) L (PM): 1/2 salad with chicken Caesar and cheese or leftovers meat and vegetables (16-21 g protein) Snk (PM): [facility] Protein bar or (10 g) D (PM): 3 oz baked fish or chicken with coleslaw (21 g protein)  Snk (PM): hot tea or diet hot chocolate (4 g CHO) with Reddi Whip sometimes  Fluid intake: about water 64 oz and 4-6 oz hot chocolate Estimated total protein intake: ~ 65-75 g protein  Medications: see list Supplementation: taking   Using straws: Yes to get in water Drinking while eating: No Hair loss: No, taking biotin Carbonated beverages: one every 3 months (Diet [facility]) N/V/D/C: constipation sometimes, will take probiotics Dumping syndrome: No  Recent physical activity:  5 x week 45-60 walk    Progress Towards Goal(s):  In progress.   Nutritional Diagnosis:  [place]-3.3 Overweight/obesity related to past poor dietary habits and physical inactivity as evidenced by patient w/ recent sleeve gastrectomy surgery following dietary guidelines for continued weight loss.    Intervention:  Nutrition counseling/diet enforcement.  Teaching Method Utilized:  Visual Auditory Hands on  Barriers to learning/adherence to lifestyle change: none  Demonstrated degree of understanding via:  Teach Back   Monitoring/Evaluation:  Dietary intake, exercise, and body weight. Follow up prn.

## 2014-06-19 ENCOUNTER — Telehealth (INDEPENDENT_AMBULATORY_CARE_PROVIDER_SITE_OTHER): Payer: Self-pay

## 2014-06-19 ENCOUNTER — Ambulatory Visit (INDEPENDENT_AMBULATORY_CARE_PROVIDER_SITE_OTHER): Payer: BC Managed Care – PPO | Admitting: General Surgery

## 2014-06-19 NOTE — Telephone Encounter (Signed)
Pt seen today by Dr Andrey Campanile and yearly labs for bariatric ordered.

## 2014-07-11 ENCOUNTER — Other Ambulatory Visit: Payer: Self-pay

## 2014-10-20 ENCOUNTER — Other Ambulatory Visit: Payer: Self-pay | Admitting: General Surgery

## 2014-10-20 LAB — CBC WITH DIFFERENTIAL/PLATELET
BASOS PCT: 1 % (ref 0–1)
Basophils Absolute: 0.1 10*3/uL (ref 0.0–0.1)
Eosinophils Absolute: 0.1 10*3/uL (ref 0.0–0.7)
Eosinophils Relative: 1 % (ref 0–5)
HCT: 40.2 % (ref 36.0–46.0)
Hemoglobin: 13.3 g/dL (ref 12.0–15.0)
LYMPHS ABS: 2.3 10*3/uL (ref 0.7–4.0)
LYMPHS PCT: 42 % (ref 12–46)
MCH: 29.9 pg (ref 26.0–34.0)
MCHC: 33.1 g/dL (ref 30.0–36.0)
MCV: 90.3 fL (ref 78.0–100.0)
MONO ABS: 0.4 10*3/uL (ref 0.1–1.0)
Monocytes Relative: 7 % (ref 3–12)
NEUTROS ABS: 2.6 10*3/uL (ref 1.7–7.7)
NEUTROS PCT: 49 % (ref 43–77)
PLATELETS: 316 10*3/uL (ref 150–400)
RBC: 4.45 MIL/uL (ref 3.87–5.11)
RDW: 12.8 % (ref 11.5–15.5)
WBC: 5.4 10*3/uL (ref 4.0–10.5)

## 2014-10-20 LAB — LIPID PANEL
Cholesterol: 210 mg/dL — ABNORMAL HIGH (ref 0–200)
HDL: 47 mg/dL (ref >39)
LDL CALC: 143 mg/dL — AB (ref 0–99)
Total CHOL/HDL Ratio: 4.5 Ratio
Triglycerides: 100 mg/dL (ref <150)
VLDL: 20 mg/dL (ref 0–40)

## 2014-10-20 LAB — CMP AND LIVER
ALT: 17 U/L (ref 0–35)
AST: 21 U/L (ref 0–37)
Albumin: 3.8 g/dL (ref 3.5–5.2)
Alkaline Phosphatase: 47 U/L (ref 39–117)
BILIRUBIN INDIRECT: 0.3 mg/dL (ref 0.2–1.2)
BILIRUBIN TOTAL: 0.4 mg/dL (ref 0.2–1.2)
BUN: 15 mg/dL (ref 6–23)
Bilirubin, Direct: 0.1 mg/dL (ref 0.0–0.3)
CO2: 28 meq/L (ref 19–32)
CREATININE: 0.78 mg/dL (ref 0.50–1.10)
Calcium: 9 mg/dL (ref 8.4–10.5)
Chloride: 105 mEq/L (ref 96–112)
Glucose, Bld: 87 mg/dL (ref 70–99)
POTASSIUM: 4.5 meq/L (ref 3.5–5.3)
Sodium: 140 mEq/L (ref 135–145)
TOTAL PROTEIN: 6.6 g/dL (ref 6.0–8.3)

## 2014-10-20 LAB — IRON AND TIBC
%SAT: 28 % (ref 20–55)
IRON: 78 ug/dL (ref 42–145)
TIBC: 276 ug/dL (ref 250–470)
UIBC: 198 ug/dL (ref 125–400)

## 2014-10-20 LAB — VITAMIN B12: Vitamin B-12: 818 pg/mL (ref 211–911)

## 2014-10-21 LAB — VITAMIN D 25 HYDROXY (VIT D DEFICIENCY, FRACTURES): Vit D, 25-Hydroxy: 26 ng/mL — ABNORMAL LOW (ref 30–100)

## 2014-11-24 IMAGING — CR DG CHEST 2V
2 series · 2 of 2 positions shown · non-contrast
Comparison: 09/22/2004

CLINICAL DATA: Preoperative evaluation for bariatric surgery,
recently quit smoking, cough, congestion and chest cold

CHEST - 2 VIEW

[view not recorded (1 of 2)]
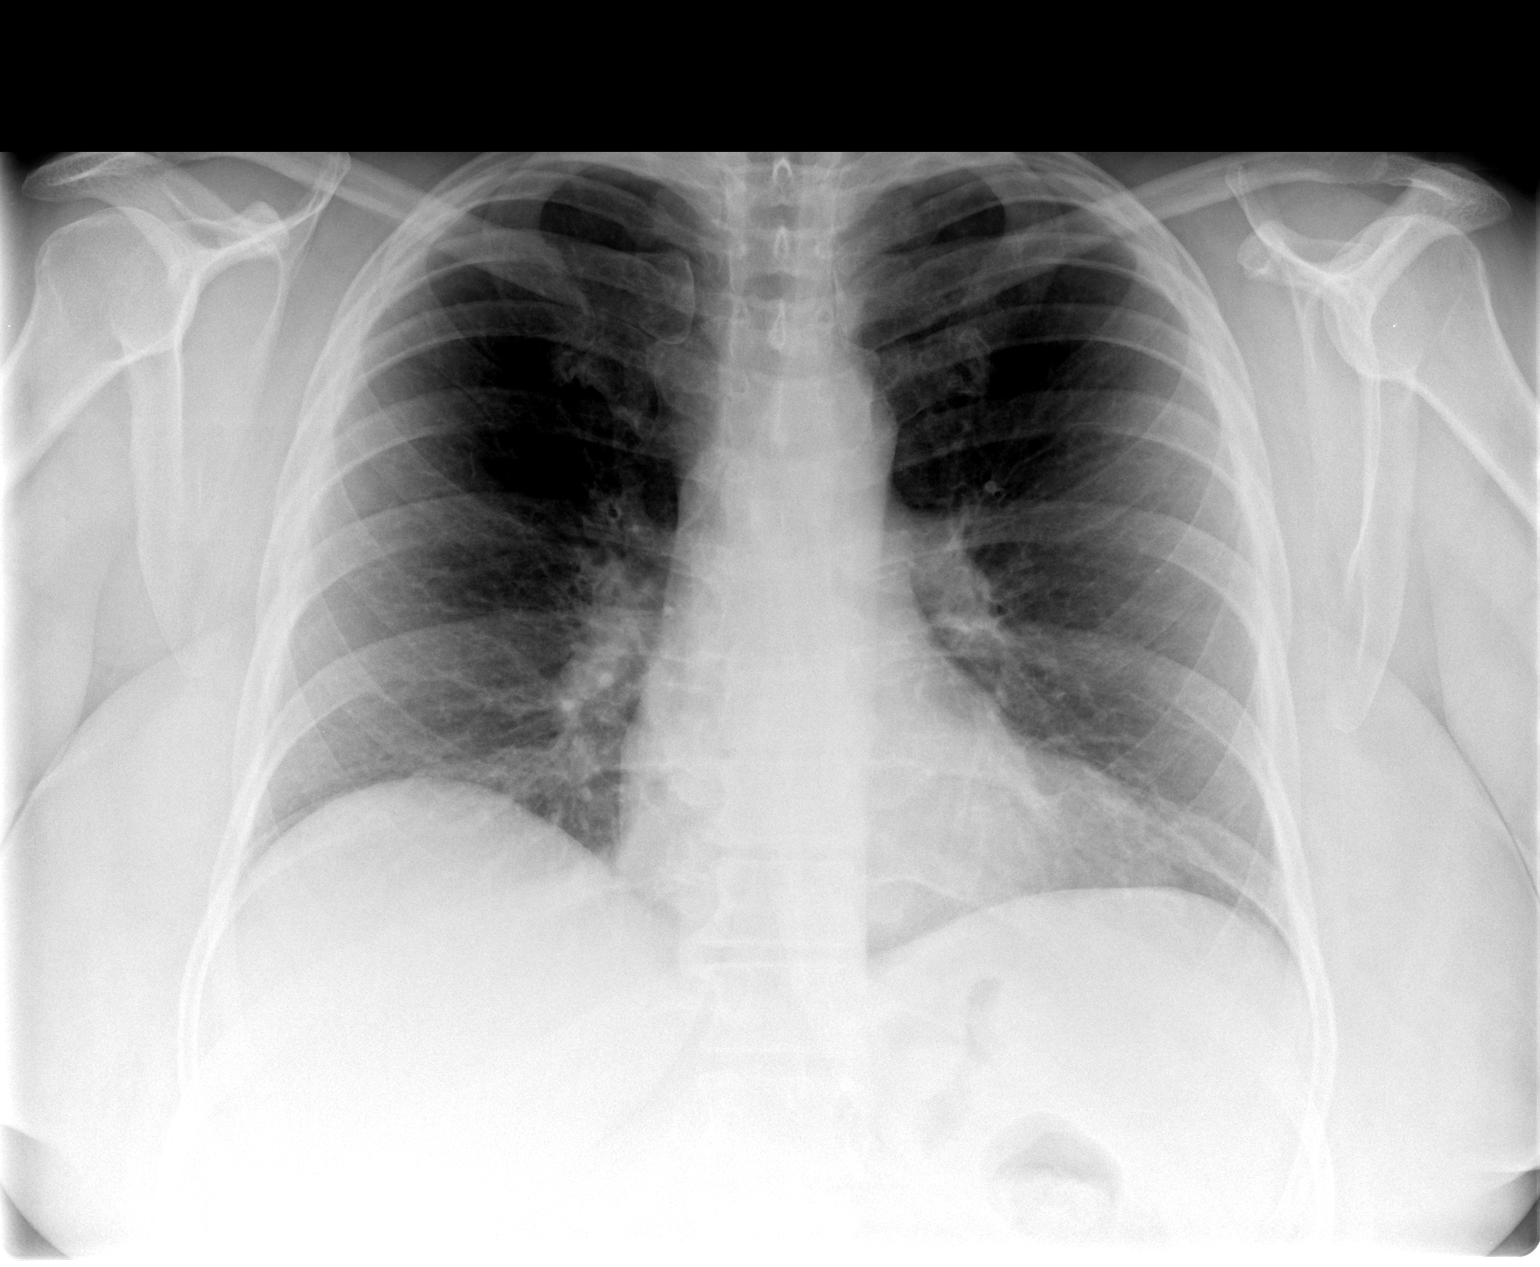

[view not recorded (2 of 2)]
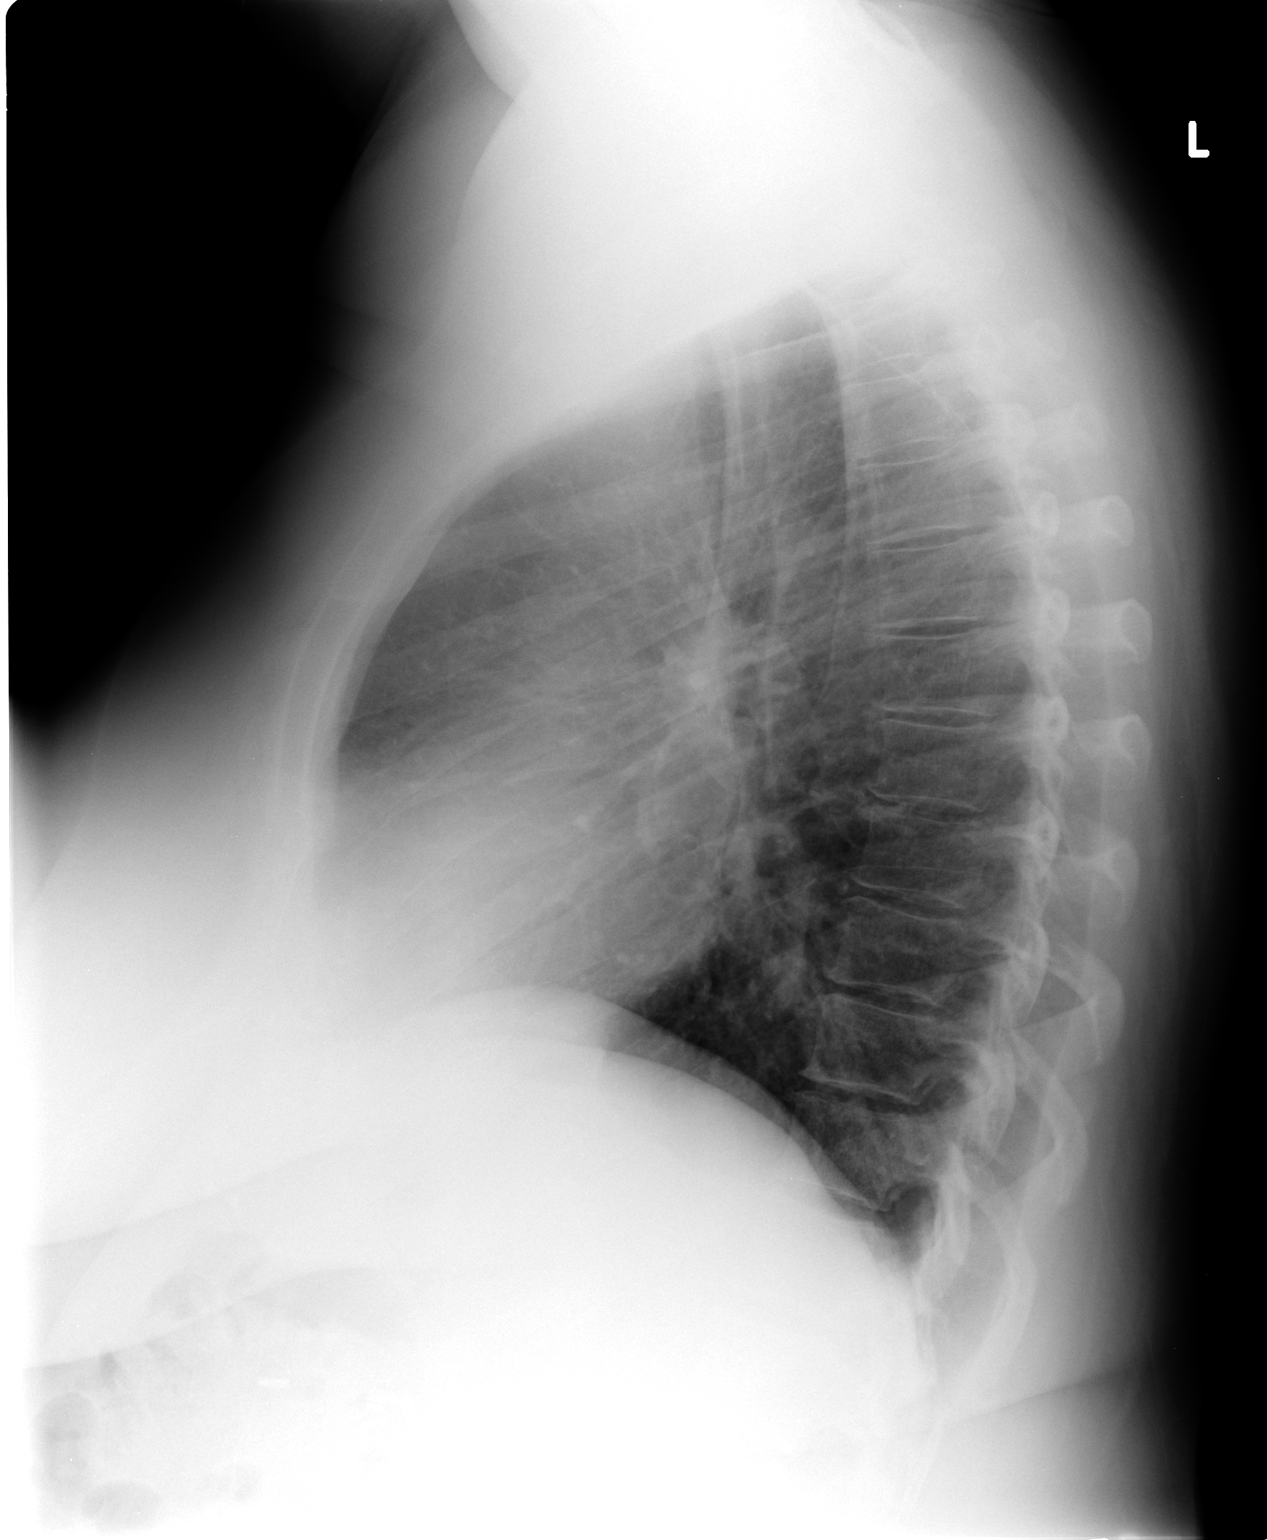

[2 of 2 positions shown; findings below may reference images not displayed]

FINDINGS: Normal heart size, mediastinal contours, and pulmonary vascularity.
Peribronchial thickening slightly increased since previous exam.
No acute infiltrate, pleural effusion or pneumothorax.
No acute osseous findings.
IMPRESSION: Mild bronchitic changes.

## 2015-02-10 ENCOUNTER — Other Ambulatory Visit (HOSPITAL_COMMUNITY)
Admission: RE | Admit: 2015-02-10 | Discharge: 2015-02-10 | Disposition: A | Discharge status: Home / Self Care | Payer: BC Managed Care – PPO | Source: Ambulatory Visit | Attending: Family Medicine | Admitting: Family Medicine

## 2015-02-10 ENCOUNTER — Ambulatory Visit (INDEPENDENT_AMBULATORY_CARE_PROVIDER_SITE_OTHER): Payer: BC Managed Care – PPO | Admitting: Family Medicine

## 2015-02-10 ENCOUNTER — Encounter: Payer: Self-pay | Admitting: Family Medicine

## 2015-02-10 VITALS — BP 112/77 | HR 74 | Temp 98.2°F | Ht 65.0 in | Wt 204.0 lb

## 2015-02-10 DIAGNOSIS — Z0000 Encounter for general adult medical examination without abnormal findings: Secondary | ICD-10-CM | POA: Diagnosis not present

## 2015-02-10 DIAGNOSIS — Z01419 Encounter for gynecological examination (general) (routine) without abnormal findings: Secondary | ICD-10-CM | POA: Diagnosis present

## 2015-02-10 DIAGNOSIS — N926 Irregular menstruation, unspecified: Secondary | ICD-10-CM

## 2015-02-10 DIAGNOSIS — Z1151 Encounter for screening for human papillomavirus (HPV): Secondary | ICD-10-CM | POA: Diagnosis present

## 2015-02-10 DIAGNOSIS — Z124 Encounter for screening for malignant neoplasm of cervix: Secondary | ICD-10-CM

## 2015-02-10 LAB — TSH: TSH: 2.196 u[IU]/mL (ref 0.350–4.500)

## 2015-02-10 NOTE — Patient Instructions (Signed)
Things to do to Keep yourself Healthy - Exercise at least 30-45 minutes a day, at least 5 days a week.  - Eat a low-fat diet with lots of fruits and vegetables, up to 7-9 servings per day. - Seatbelts can save your life. Wear them always. - Smoke detectors on every level of your home, check batteries every year. - Eye Doctor - have an eye exam every 1-2 years - Safe sex - if you may be exposed to STDs, use a condom. - Alcohol If you drink, do it moderately,less than 2 drinks per day. - Health Care Power of Attorney.  Choose someone to speak for you if you are not able. - Depression is common in our stressful world.If you're feeling down or losing interest in things you normally enjoy, please come in for a visit. - Violence - If anyone is threatening or hurting you, please call immediately.

## 2015-02-10 NOTE — Assessment & Plan Note (Addendum)
Pap smear performed today with good testing for HPV -Cholesterol panel from 10/20/14 revealed an elevated total cholesterol of 210 with an LDL of 143 and HDL of 47 -Congratulated patient on continuing to be tobacco free -Patient declines STD testing -Patient late on her tetanus shot per her report, however declines today -Patient plans to get mammogram this year. -Colonoscopy in one year -Patient concerned about menopause versus nutrient deficiency due to amenorrhea, will obtain a TSH and FSH to assess perimenopausal status and other possibilities of amenorrhea

## 2015-02-10 NOTE — Progress Notes (Signed)
Patient ID: Brenda MeiersBrenda A Smoots, female   DOB: November 26, 1965, 49 y.o.   MRN: [id number] 49 y.o. year old female presents for well woman/preventative visit and annual GYN examination.  Acute Concerns: Patient noted to have a boil on the right side of her groin. States she's had multiple was in the past. Normally they are painful and then "burst." She states this one lasted yesterday continues to have a minimal amount of clear drainage. No fevers chills.  The pain is starting to improve. States she's had multiple boils in her axilla and in her growing in the past. They resolve on their on normally  Diet: The patient is status post gastric sleeve surgery.  Proteins and vegetables. Avoids fatty/fried foods. Tries to avoid sweets.  Of note, vitamin D was slightly low at 26 from labs drawn on January 2016 from her bariatric physician- patient states that she follows up with them regularly. Not currently taking calcium and vitamin D supplement  Exercise: Walking daily 30 minutes or longer.   Sexual/Birth History: [id number]  LMP: 01/20/15. Irregular since gastric sleeve surgery.  Patient concerned that she has only had 3 or 4 menses since her surgery over one year ago. Denies any intermenstrual spotting. Unsure if this is due to menopause or secondary to poor nutrient status status post surgery.  Birth Control: BTL approximately 24 yrs ago.  12 point ROS all negative besides that noted in HPI  Social:  History   Social History  . Marital Status: Married    Spouse Name: N/A  . Number of Children: N/A  . Years of Education: N/A   Social History Main Topics  . Smoking status: Former Smoker -- 1.00 packs/day for 10 years    Types: Cigarettes    Quit date: 12/04/2012  . Smokeless tobacco: Never Used  . Alcohol Use: No  . Drug Use: No  . Sexual Activity: Yes   Other Topics Concern  . None   Social History Narrative    Immunization:  Tdap/TD: Not today, patient declines today  Cancer  Screening:  Pap Smear: 08/01/2011, negative for intraepithelial lesion or malignancy  Mammogram: 03/07/2013, negative  Colonoscopy: N/A  Medications: Allegra-D, probiotic, vitamin 12, biotin  Physical Exam: Blood pressure 112/77, pulse 74, temperature 98.2 F (36.8 C), temperature source Oral, height 5\' 5"  (1.651 m), weight 204 lb (92.534 kg), last menstrual period 01/20/2015. GEN: Sitting up in the chair in no acute distress HEENT: Atraumatic, normocephalic, pupils equal round and reactive to light. Conjunctiva noninjected. Oropharynx clear. Moist mucous members. noted. Tympanic membranes clear without erythema or bulging. No lymphadenopathy noted. No thyromegaly noted. CARDIAC: Rate and rhythm, no murmurs rubs or gallops noted. 2+ DP pulses bilaterally RESP: No increased work of breathing. Clear to auscultation bilaterally without wheezing rhonchi or crackles noted. ABD: Positive bowel sounds, soft, nondistended, nontender. No hepatosplenomegaly GU/GYN:Exam performed in the presence of a chaperone. External genitalia within normal limits.  Vaginal mucosa pink, moist, normal rugae.  Nonfriable cervix without lesions, no discharge or bleeding noted on speculum exam.  Bimanual exam revealed normal, nongravid uterus.  No cervical motion tenderness. No adnexal masses bilaterally.    EXT: No gross deformities noted. Normal tone SKIN: Small 1 cm boil within the R groin with no expressible drainage.  No surrounding erythema or warmth.   ASSESSMENT & PLAN: 49 y.o. female presents for annual well woman/preventative exam and GYN exam. Please see problem specific assessment and plan.

## 2015-02-11 LAB — CYTOLOGY - PAP

## 2015-02-11 LAB — FOLLICLE STIMULATING HORMONE: FSH: 79.6 m[IU]/mL

## 2015-02-12 ENCOUNTER — Encounter: Payer: Self-pay | Admitting: Family Medicine

## 2016-01-12 ENCOUNTER — Encounter: Payer: BC Managed Care – PPO | Attending: General Surgery | Admitting: Dietician

## 2016-01-12 ENCOUNTER — Encounter: Payer: Self-pay | Admitting: Dietician

## 2016-01-12 VITALS — Ht 65.0 in | Wt 203.8 lb

## 2016-01-12 DIAGNOSIS — Z9884 Bariatric surgery status: Secondary | ICD-10-CM | POA: Diagnosis present

## 2016-01-12 DIAGNOSIS — E669 Obesity, unspecified: Secondary | ICD-10-CM

## 2016-01-12 NOTE — Progress Notes (Signed)
Bariatric Refresher visit:  2+ Years Post-Operative Sleeve Gastrecomy Surgery  Medical Nutrition Therapy:  Appt start time: 1115 end time:  1155.  Primary concerns today: Post-operative Bariatric Surgery Nutrition Management. Dr. Andrey CampanileWilson asked that she visit since he was concerned that she wasn't taking her vitamins and calcium. Has not been taking calcium for over a year. Calcium is making her constipated. Multivitamins make her nauseas. Does not make a difference with full or empty stomach. Taking protonix. Happy with where weight is at but would like to lose about 10-20 more pounds. Lowest weight was 196 lbs. Thinks she may be overeating also. Eats cashews through the day. Was drinking diet soda until recently. Has not been exercising. Waiting for summer to start back to it.   Schedule is very busy. Daughter is getting married this Saturday.   Surgery date: 06/24/2013 Surgery type: Sleeve Gastrectomy [facility] lbs on 03/06/13  Weight today:203.8 lbs  Weight loss: 2.0 lbs Total weight loss: 71.2 lbs  Preferred Learning Style:   No preference indicated   Learning Readiness:   Ready  24-hr recall: B (AM): Slim Fast (20 g) Snk (10-11 AM): none or fruit or egg salad with toast (7 g) L (12-1 PM): egg salad with toast or 1/2 salad with chicken Caesar and cheese (7-14 g protein) Snk (2-3 PM): grapes or cashews or apple or banana with peanut butter (5 g) D (PM): Grilled chicken taco (inside no taco) (14 g protein)  Snk (PM): diet hot chocolate (4 g CHO)   Fluid intake: was drinking diet soda, 48 oz  water 10 oz and 4-6 oz hot chocolate Estimated total protein intake: ~ 53-60 g protein  Medications: see list Supplementation: taking calcium chews, not taking Vitamin B12 or Multivitamin  Using straws: No Drinking while eating: Yes Hair loss: No Carbonated beverages: no anymore N/V/D/C: has constipation with calcium and taking stool softener with laxative which is  helping Dumping syndrome: No  Recent physical activity:  none  Progress Towards Goal(s):  In progress.   Nutritional Diagnosis:  [place]-3.3 Overweight/obesity related to past poor dietary habits and physical inactivity as evidenced by patient w/ recent sleeve gastrectomy surgery following dietary guidelines for continued weight loss.    Intervention:  Nutrition counseling/diet enforcement. Goals:  Follow Phase 3B: High Protein + Non-Starchy Vegetables  Eat 3-6 small meals/snacks, every 3-5 hrs  Increase lean protein foods to meet 60g goal  Increase fluid intake to 64oz +  Avoid drinking 15 minutes before, during and 30 minutes after eating  Aim for >30 min of physical activity daily when you can  Eat protein first, then non starchy vegetables, and limit extra carbs   Eat meals and snacks without distractions when possible  Eat snacks only if you hungry  Try taking a multivitamin without iron and take at night  Continue taking the calcium  Teaching Method Utilized:  Visual Auditory Hands on  Barriers to learning/adherence to lifestyle change: none  Demonstrated degree of understanding via:  Teach Back   Monitoring/Evaluation:  Dietary intake, exercise, and body weight. Follow up 1 month.

## 2016-01-12 NOTE — Patient Instructions (Addendum)
Goals:  Follow Phase 3B: High Protein + Non-Starchy Vegetables  Eat 3-6 small meals/snacks, every 3-5 hrs  Increase lean protein foods to meet 60g goal  Increase fluid intake to 64oz +  Avoid drinking 15 minutes before, during and 30 minutes after eating  Aim for >30 min of physical activity daily when you can  Eat protein first, then non starchy vegetables, and limit extra carbs   Eat meals and snacks without distractions when possible  Eat snacks only if you hungry  Try taking a multivitamin without iron and take at night  Continue taking the calcium

## 2016-02-10 ENCOUNTER — Encounter: Payer: Self-pay | Admitting: Dietician

## 2016-02-10 ENCOUNTER — Encounter: Payer: BC Managed Care – PPO | Attending: General Surgery | Admitting: Dietician

## 2016-02-10 VITALS — Ht 65.0 in | Wt 206.1 lb

## 2016-02-10 DIAGNOSIS — Z9884 Bariatric surgery status: Secondary | ICD-10-CM | POA: Insufficient documentation

## 2016-02-10 DIAGNOSIS — E669 Obesity, unspecified: Secondary | ICD-10-CM

## 2016-02-10 NOTE — Patient Instructions (Addendum)
Goals:  Follow Phase 3B: High Protein + Non-Starchy Vegetables  Eat 3-6 small meals/snacks, every 3-5 hrs  Increase lean protein foods to meet 60g goal  Increase fluid intake to 64oz +  Avoid drinking 15 minutes before, during and 30 minutes after eating  Aim for >30 min of physical activity daily when you can  Increase physical once school is out for the summer   Eat protein first, then non starchy vegetables, and limit extra carbs to help weight loss jump start  Eat meals and snacks without distractions when possible  Eat snacks only if you hungry  Try taking a multivitamin without iron and take at night (look for Centrum 50+ or Deva Vegan iron free from [employer])  Continue taking the calcium

## 2016-02-10 NOTE — Progress Notes (Signed)
Bariatric Refresher visit:  2+ Years Post-Operative Sleeve Gastrecomy Surgery  Medical Nutrition Therapy:  Appt start time: 945 end time:  1010  Primary concerns today: Post-operative Bariatric Surgery Nutrition Management. Returns with a 2.3 lb weight gain. Some days hungrier than other. Has been eating "junk" the last 2 days. Had a cake for Mother's Day and still eating a lot of nuts. Dr. Andrey CampanileWilson told her to not have diet soda since she was having problems acid. Stomach is better without having soda. Also has a new medication for acid. Could not find multivitamin without iron though could not find any without iron. Has not been taking any multivitamin. Taking calcium. Constipation is better.   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 Plans to work on increasing activity and improving diet over the summer.    Surgery date: 06/24/2013 Surgery type: Sleeve Gastrectomy [facility] lbs on 03/06/13  Weight today:206.1 Weight loss: 2.3 lbs weight gain Total weight loss: 71.2 lbs  Preferred Learning Style:   No preference indicated   Learning Readiness:   Ready  24-hr recall: B (AM): Slim Fast (20 g) Snk (10-11 AM): none or fruit or egg salad/cheese with toast (7 g) L (12-1 PM): egg salad with toast or 1/2 salad with chicken Caesar and cheese or leftovers usually (7-14 g protein) Snk (2-3 PM): grapes or cashews or apple or banana with peanut butter (5 g) D (PM): Grilled chicken taco (inside no taco) (14 g protein)  Snk (PM): diet hot chocolate (4 g CHO)   Fluid intake: 10 oz Slim fast, water 48 oz and 6 oz hot chocolate Estimated total protein intake: ~ 53-60 g protein  Medications: see list Supplementation: taking calcium chews, not taking Vitamin B12 or Multivitamin  Using straws: No Drinking while eating: sometimes Hair loss: No Carbonated beverages: no anymore N/V/D/C: constipation is better Dumping syndrome: No  Recent physical activity:  Walks for  30 + minutes 7 x week   Progress Towards Goal(s):  In progress.   Nutritional Diagnosis:  [place]-3.3 Overweight/obesity related to past poor dietary habits and physical inactivity as evidenced by patient w/ recent sleeve gastrectomy surgery following dietary guidelines for continued weight loss.    Intervention:  Nutrition counseling/diet enforcement. Goals:  Follow Phase 3B: High Protein + Non-Starchy Vegetables  Eat 3-6 small meals/snacks, every 3-5 hrs  Increase lean protein foods to meet 60g goal  Increase fluid intake to 64oz +  Avoid drinking 15 minutes before, during and 30 minutes after eating  Aim for >30 min of physical activity daily when you can  Increase physical once school is out for the summer   Eat protein first, then non starchy vegetables, and limit extra carbs to help weight loss jump start  Eat meals and snacks without distractions when possible  Eat snacks only if you hungry  Try taking a multivitamin without iron and take at night (look for Centrum 50+ or Deva Vegan iron free from [employer])  Continue taking the calcium  Teaching Method Utilized:  Visual Auditory Hands on  Barriers to learning/adherence to lifestyle change: none  Demonstrated degree of understanding via:  Teach Back   Monitoring/Evaluation:  Dietary intake, exercise, and body weight. Follow up prn

## 2016-04-11 ENCOUNTER — Encounter: Payer: Self-pay | Admitting: Family Medicine

## 2016-04-11 ENCOUNTER — Ambulatory Visit (INDEPENDENT_AMBULATORY_CARE_PROVIDER_SITE_OTHER): Payer: BC Managed Care – PPO | Admitting: Family Medicine

## 2016-04-11 VITALS — BP 114/57 | HR 79 | Temp 98.3°F | Ht 65.0 in | Wt 205.6 lb

## 2016-04-11 DIAGNOSIS — M545 Low back pain, unspecified: Secondary | ICD-10-CM | POA: Insufficient documentation

## 2016-04-11 DIAGNOSIS — M7060 Trochanteric bursitis, unspecified hip: Secondary | ICD-10-CM | POA: Insufficient documentation

## 2016-04-11 DIAGNOSIS — M7061 Trochanteric bursitis, right hip: Secondary | ICD-10-CM

## 2016-04-11 DIAGNOSIS — M542 Cervicalgia: Secondary | ICD-10-CM | POA: Diagnosis not present

## 2016-04-11 MED ORDER — PREDNISONE 20 MG PO TABS: ORAL_TABLET | ORAL | Status: DC

## 2016-04-11 MED ORDER — CYCLOBENZAPRINE HCL 5 MG PO TABS: 5.0000 mg | ORAL_TABLET | Freq: Every day | ORAL | Status: DC

## 2016-04-11 NOTE — Assessment & Plan Note (Signed)
Patient's lumbar pain consistent with muscle spasm. She has a normal exam today without any pain. There are no red flags on exam or history. We discussed the importance of weight loss. Also discussed stretching exercises and heat as well. -Continue with these conservative measures -We'll try Flexeril as needed for muscle spasms -Discussed return precautions with the patient

## 2016-04-11 NOTE — Patient Instructions (Addendum)
Start beginners yoga- stretching will be good for your lower back. I have prescribed Flexeril to help with muscle spasm, this can make you drowsy sore recommended taking it in the evening. Try not to do a lot of weightbearing exercises on her left leg, that will make your bursitis worse. In the future, if the pain has not resolved, feel free to follow-up with us and we can talk about other options such as a cortisone injection.   Hip Bursitis Bursitis is a swelling and soreness (inflammation) of a fluid-filled sac (bursa). This sac overlies and protects the joints.  CAUSES   Injury.  Overuse of the muscles surrounding the joint.  Arthritis.  Gout.  Infection.  Cold weather.  Inadequate warm-up and conditioning prior to activities. The cause may not be known.  SYMPTOMS   Mild to severe irritation.  Tenderness and swelling over the outside of the hip.  Pain with motion of the hip.  If the bursa becomes infected, a fever may be present. Redness, tenderness, and warmth will develop over the hip. Symptoms usually lessen in 3 to 4 weeks with treatment, but can come back. TREATMENT If conservative treatment does not work, your caregiver may advise draining the bursa and injecting cortisone into the area. This may speed up the healing process. This may also be used as an initial treatment of choice. HOME CARE INSTRUCTIONS   Apply ice to the affected area for 15-20 minutes every 3 to 4 hours while awake for the first 2 days. Put the ice in a plastic bag and place a towel between the bag of ice and your skin.  Rest the painful joint as much as possible, but continue to put the joint through a normal range of motion at least 4 times per day. When the pain lessens, begin normal, slow movements and usual activities to help prevent stiffness of the hip.  Only take over-the-counter or prescription medicines for pain, discomfort, or fever as directed by your caregiver.  Use crutches to  limit weight bearing on the hip joint, if advised.  Elevate your painful hip to reduce swelling. Use pillows for propping and cushioning your legs and hips.  Gentle massage may provide comfort and decrease swelling. SEEK IMMEDIATE MEDICAL CARE IF:   Your pain increases even during treatment, or you are not improving.  You have a fever.  You have heat and inflammation over the involved bursa.  You have any other questions or concerns. MAKE SURE YOU:   Understand these instructions.  Will watch your condition.  Will get help right away if you are not doing well or get worse.   This information is not intended to replace advice given to you by your health care provider. Make sure you discuss any questions you have with your health care provider.   Document Released: 03/04/2002 Document Revised: 12/05/2011 Document Reviewed: 04/14/2015 Elsevier Interactive Patient Education [employer]2016 Elsevier Inc.

## 2016-04-11 NOTE — Progress Notes (Signed)
Subjective: CC: hip/leg pain and back pain HPI: Patient is a 50 y.o. female with a past medical history of obesity presenting to clinic today for concerns for MSK pain.  Hip pain: 5-6 months of right hip pain that has progressively gotten worse. The pain radiated from the hip down into the back side of the calf. The pain is sharp. No numbness and tingling of the leg. She notes it is inhibiting sleeping. It hurts for her to lie on that hip.  It hurts constantly. Heat provides no benefit. Ibuprofen doesn't help. She notes weakness secondary to pain. No recalled injury.  Back pain: lumbar back pain bilaterally for about 5 months. Pain is constant. No radiation. Pain is aching. She thinks it could be muscles. No radiation of pain down her legs from her back.  Nothing makes the back pain worse. No saddles parethesias. No bowel/urinary incontinence.  Bengay patches helps some. Heat helps some. Ibuprofen is mildly beneficial. No recalled injury.    Social History: former smoker  ROS: All other systems reviewed and are negative.  Past Medical History Patient Active Problem List   Diagnosis Date Noted  . Trochanteric bursitis 04/11/2016  . Lumbar pain 04/11/2016  . Neck pain on right side 03/04/2014  . Obesity (BMI 30-39.9) 11/07/2013  . Infection of urinary tract 10/21/2013  . 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 Acute blood loss anemia 06/26/2013  . H/O tobacco use, presenting hazards to health 02/07/2013  . Health care maintenance 02/06/2013  . Allergic rhinitis, cause unspecified 08/02/2011  . 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 HYPERCHOLESTEROLEMIA 11/23/2006  . TOBACCO DEPENDENCE 11/23/2006  . HYPERTENSION, BENIGN SYSTEMIC 11/23/2006  . HYDRADENITIS 11/23/2006  . 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 Biotin  (BIOTIN 5000) 5 MG CAPS Take by mouth.    . cyclobenzaprine (FLEXERIL) 5 MG tablet Take 1 tablet (5 mg total) by mouth at bedtime. 20 tablet 0  . fexofenadine (ALLEGRA) 30 MG tablet Take 30 mg by mouth 2 (two) times daily.    . Multiple Vitamin (MULTIVITAMIN) tablet Take 1 tablet by mouth daily.    . predniSONE (DELTASONE) 20 MG tablet Take  x 2 days,  x 2 days,  x 3 days 13 tablet 0  . Probiotic Product (PROBIOTIC DAILY PO) Take by mouth.    . ranitidine (ZANTAC) 150 MG tablet Take 150 mg by mouth 2 (two) times daily.    . vitamin B-12 (CYANOCOBALAMIN) 250 MCG tablet Take 250 mcg by mouth daily.     No current facility-administered medications for this visit.    Objective: Office vital signs reviewed. BP 114/57 mmHg  Pulse 79  Temp(Src) 98.3 F (36.8 C) (Oral)  Ht  (1.651 m)  Wt 205 lb 9.6 oz (93.26 kg)  BMI 34.21 kg/m2  LMP    Physical Examination:  General: Awake, alert, well- nourished, NAD Back: Normal to inspection. No tenderness over the spinous processes. Tenderness over the paraspinal muscles bilaterally in the lumbar region.  Full range of motion in flexion, extension, and lateral side bends.  Negative straight leg raise. 4+ out of 5 strength in the right lower extremity secondary to pain from the hip. Sensation intact bilaterally Right hip: Normal to inspection. Mildly decreased internal rotation on the right compared to the left with some mild pain. Significantly tender over the greater trochanter.  Assessment/Plan: Trochanteric bursitis Exam consistent with trochanteric bursitis. She actually has pretty good range of motion in her hips, mildly decreased on the right compared to the left, however I do not think arthritis is her main issue at this time.  Discussed the implications of her exam. Patient would like to avoid injections if possible. -We'll attempt a steroid taper as the patient cannot take NSAIDs -Patient to rest that area and ice that area  frequently -Patient to follow-up in one month or sooner as needed  Lumbar pain Patient's lumbar pain consistent with muscle spasm. She has a normal exam today without any pain. There are no red flags on exam or history. We discussed the importance of weight loss. Also discussed stretching exercises and heat as well. -Continue with these conservative measures -We'll try Flexeril as needed for muscle spasms -Discussed return precautions with the patient    No orders of the defined types were placed in this encounter.    Meds ordered this encounter  Medications  . predniSONE (DELTASONE) 20 MG tablet    Sig: Take 60mg  x 2 days, 40mg  x 2 days, 20mg  x 3 days    Dispense:  13 tablet    Refill:  0  . cyclobenzaprine (FLEXERIL) 5 MG tablet    Sig: Take 1 tablet (5 mg total) by mouth at bedtime.    Dispense:  20 tablet    Refill:  0    Joanna Puffrystal S. Dorsey PGY-3, [facility]

## 2016-04-11 NOTE — Assessment & Plan Note (Signed)
Exam consistent with trochanteric bursitis. She actually has pretty good range of motion in her hips, mildly decreased on the right compared to the left, however I do not think arthritis is her main issue at this time.  Discussed the implications of her exam. Patient would like to avoid injections if possible. -We'll attempt a steroid taper as the patient cannot take NSAIDs -Patient to rest that area and ice that area frequently -Patient to follow-up in one month or sooner as needed

## 2016-06-13 ENCOUNTER — Encounter: Payer: Self-pay | Admitting: Internal Medicine

## 2016-06-13 ENCOUNTER — Ambulatory Visit (INDEPENDENT_AMBULATORY_CARE_PROVIDER_SITE_OTHER): Payer: BC Managed Care – PPO | Admitting: Internal Medicine

## 2016-06-13 VITALS — BP 114/66 | HR 81 | Temp 97.9°F | Ht 65.0 in | Wt 206.6 lb

## 2016-06-13 DIAGNOSIS — N3000 Acute cystitis without hematuria: Secondary | ICD-10-CM

## 2016-06-13 DIAGNOSIS — R300 Dysuria: Secondary | ICD-10-CM

## 2016-06-13 LAB — POCT URINALYSIS DIPSTICK
BILIRUBIN UA: NEGATIVE
Blood, UA: NEGATIVE
Glucose, UA: NEGATIVE
KETONES UA: NEGATIVE
Leukocytes, UA: NEGATIVE
Nitrite, UA: POSITIVE
PH UA: 7
PROTEIN UA: NEGATIVE
SPEC GRAV UA: 1.02
Urobilinogen, UA: 1

## 2016-06-13 LAB — POCT UA - MICROSCOPIC ONLY

## 2016-06-13 MED ORDER — CEPHALEXIN 500 MG PO CAPS: 500.0000 mg | ORAL_CAPSULE | Freq: Two times a day (BID) | ORAL | 0 refills | Status: DC

## 2016-06-13 MED ORDER — PHENAZOPYRIDINE HCL 100 MG PO TABS: 100.0000 mg | ORAL_TABLET | Freq: Three times a day (TID) | ORAL | 0 refills | Status: DC | PRN

## 2016-06-13 NOTE — Patient Instructions (Addendum)
It was nice meeting you today [name]!  For your UTI, please begin taking Keflex 500 mg twice a day for the next 7 days. You can also take pyridium up to three times a day for pain with urination.   If you begin developing high fevers or chills, or if your symptoms do not improve in a week, please call the office.   If you have any questions or concerns, please feel free to call the clinic.   Be well,  Dr. Natale MilchLancaster

## 2016-06-13 NOTE — Assessment & Plan Note (Signed)
UA with positive nitrites, as well as symptoms of dysuria and increased urinary frequency. No fevers, nausea, vomiting, chills, suggestive of urosepsis. Given previous reaction to Bactrim and responsiveness to Keflex, will begin Keflex 500mg  BID x7d. Also given pyridium TID PRN.

## 2016-06-13 NOTE — Progress Notes (Signed)
uri

## 2016-06-13 NOTE — Progress Notes (Signed)
   Subjective:    Patient ID: Brenda Ware, female    DOB: [birth year], 50 y.o.   MRN: [id number]  HPI  Patient presents for same day visit for dysuria.  Patient reporting dysuria x1 week. Also endorses increased urinary frequency. Denies hematuria. Denies fevers, chills. Endorses some abdominal cramping but no abdominal pain. Has had UTIs in the past and says these symptoms feel similar. Has taken Azeo at home with some symptomatic improvement.  For past UTIs, has taken Keflex with good response. Was given Bactrim one time and says she became very ill.    Review of Systems See HPI.     Objective:   Physical Exam  Constitutional: She is oriented to person, place, and time. She appears well-developed and well-nourished. No distress.  HENT:  Head: Normocephalic and atraumatic.  Pulmonary/Chest: Effort normal. No respiratory distress.  Abdominal: Soft. Bowel sounds are normal. She exhibits no distension. There is no tenderness.  Musculoskeletal:  No CVA tenderness.  Neurological: She is alert and oriented to person, place, and time.  Psychiatric: She has a normal mood and affect. Her behavior is normal.      Assessment & Plan:  Infection of urinary tract UA with positive nitrites, as well as symptoms of dysuria and increased urinary frequency. No fevers, nausea, vomiting, chills, suggestive of urosepsis. Given previous reaction to Bactrim and responsiveness to Keflex, will begin Keflex 500mg  BID x7d. Also given pyridium TID PRN.   Tarri AbernethyAbigail J Quinterius Gaida, MD, MPH PGY-2 [facility] Pager [phone]

## 2016-06-21 ENCOUNTER — Ambulatory Visit (INDEPENDENT_AMBULATORY_CARE_PROVIDER_SITE_OTHER): Payer: BC Managed Care – PPO | Admitting: Family Medicine

## 2016-06-21 ENCOUNTER — Encounter: Payer: Self-pay | Admitting: Family Medicine

## 2016-06-21 VITALS — BP 134/74 | HR 75 | Temp 98.1°F | Ht 65.0 in | Wt 206.0 lb

## 2016-06-21 DIAGNOSIS — M25551 Pain in right hip: Secondary | ICD-10-CM

## 2016-06-21 DIAGNOSIS — M7061 Trochanteric bursitis, right hip: Secondary | ICD-10-CM | POA: Diagnosis not present

## 2016-06-21 MED ORDER — METHYLPREDNISOLONE ACETATE 40 MG/ML IJ SUSP
40.0000 mg | Freq: Once | INTRAMUSCULAR | Status: AC
  Administered 2016-06-21: 40 mg via INTRA_ARTICULAR

## 2016-06-21 NOTE — Patient Instructions (Addendum)
Today you had an injection into the trochanteric bursa. Due to the amount of fluid, it may be more uncomfortable today but it should be improved by tomorrow evening. Sometimes the response to these injections is slow, however I'm hoping that is not the case with you.  If you do not note improvement, we can refer you to Sports medicine (just call our office).  Trochanteric Bursitis You have hip pain due to trochanteric bursitis. Bursitis means that the sack near the outside of the hip is filled with fluid and inflamed. This sack is made up of protective soft tissue. The pain from trochanteric bursitis can be severe and keep you from sleep. It can radiate to the buttocks or down the outside of the thigh to the knee. The pain is almost always worse when rising from the seated or lying position and with walking. Pain can improve after you take a few steps. It happens more often in people with hip joint and lumbar spine problems, such as arthritis or previous surgery. Very rarely the trochanteric bursa can become infected, and antibiotics and/or surgery may be needed. Treatment often includes an injection of local anesthetic mixed with cortisone medicine. This medicine is injected into the area where it is most tender over the hip. Repeat injections may be necessary if the response to treatment is slow. You can apply ice packs over the tender area for 30 minutes every 2 hours for the next few days. Anti-inflammatory and/or narcotic pain medicine may also be helpful. Limit your activity for the next few days if the pain continues. See your caregiver in 5-10 days if you are not greatly improved.  SEEK IMMEDIATE MEDICAL CARE IF:  You develop severe pain, fever, or increased redness.  You have pain that radiates below the knee. EXERCISES STRETCHING EXERCISES - Trochanteric Bursitis  These exercises may help you when beginning to rehabilitate your injury. Your symptoms may resolve with or without further  involvement from your physician, physical therapist, or athletic trainer. While completing these exercises, remember:   Restoring tissue flexibility helps normal motion to return to the joints. This allows healthier, less painful movement and activity.  An effective stretch should be held for at least 30 seconds.  A stretch should never be painful. You should only feel a gentle lengthening or release in the stretched tissue. STRETCH - Iliotibial Band  On the floor or bed, lie on your side so your injured leg is on top. Bend your knee and grab your ankle.  Slowly bring your knee back so that your thigh is in line with your trunk. Keep your heel at your buttocks and gently arch your back so your head, shoulders and hips line up.  Slowly lower your leg so that your knee approaches the floor/bed until you feel a gentle stretch on the outside of your thigh. If you do not feel a stretch and your knee will not fall farther, place the heel of your opposite foot on top of your knee and pull your thigh down farther.  Hold this stretch for __________ seconds.  Repeat __________ times. Complete this exercise __________ times per day. STRETCH - Hamstrings, Supine   Lie on your back. Loop a belt or towel over the ball of your foot as shown.  Straighten your knee and slowly pull on the belt to raise your injured leg. Do not allow the knee to bend. Keep your opposite leg flat on the floor.  Raise the leg until you feel a gentle  stretch behind your knee or thigh. Hold this position for __________ seconds.  Repeat __________ times. Complete this stretch __________ times per day. STRETCH - Quadriceps, Prone   Lie on your stomach on a firm surface, such as a bed or padded floor.  Bend your knee and grasp your ankle. If you are unable to reach your ankle or pant leg, use a belt around your foot to lengthen your reach.  Gently pull your heel toward your buttocks. Your knee should not slide out to the  side. You should feel a stretch in the front of your thigh and/or knee.  Hold this position for __________ seconds.  Repeat __________ times. Complete this stretch __________ times per day. STRETCHING - Hip Flexors, Lunge Half kneel with your knee on the floor and your opposite knee bent and directly over your ankle.  Keep good posture with your head over your shoulders. Tighten your buttocks to point your tailbone downward; this will prevent your back from arching too much.  You should feel a gentle stretch in the front of your thigh and/or hip. If you do not feel any resistance, slightly slide your opposite foot forward and then slowly lunge forward so your knee once again lines up over your ankle. Be sure your tailbone remains pointed downward.  Hold this stretch for __________ seconds.  Repeat __________ times. Complete this stretch __________ times per day. STRETCH - Adductors, Lunge  While standing, spread your legs.  Lean away from your injured leg by bending your opposite knee. You may rest your hands on your thigh for balance.  You should feel a stretch in your inner thigh. Hold for __________ seconds.  Repeat __________ times. Complete this exercise __________ times per day.   This information is not intended to replace advice given to you by your health care provider. Make sure you discuss any questions you have with your health care provider.   Document Released: 10/20/2004 Document Revised: 01/27/2015 Document Reviewed: 12/25/2008 Elsevier Interactive Patient Education [employer]2016 Elsevier Inc.

## 2016-06-21 NOTE — Progress Notes (Signed)
Subjective: CC: f/u R hip pain HPI: Patient is a 50 y.o. female with a past medical history of obesity s/p gastric bypass, trochanteric brusitis presenting to clinic today for a f/u on R hip pain.  Hip pain: 6-7 months of right hip pain that has progressively gotten worse. The pain radiated from the hip down into the back side of the calf. The pain is sharp. No numbness and tingling of the leg. It hurts for her to lie on that hip.  It hurts constantly. Heat provides no benefit. Ibuprofen doesn't help. She notes weakness secondary to pain. No recalled injury. No radiation of pain when moving. Notes pain is worse with ascending stairs. It still bothers her the most with sleeping on that side and is inhibiting sleeping. Steroids did not help. She was advised by her GI doctor not to take too much Ibuprofen or Tylenol due to gastric bypass. She denies any lower back pain  Social History: former smoker  Health Maintenance: due for HIV, Tdap, influenza, colonoscopy, and mammogram. Pt declines all further injections today.   ROS: All other systems reviewed and are negative besides that noted in HPI.  Past Medical History Patient Active Problem List   Diagnosis Date Noted  . Trochanteric bursitis 04/11/2016  . Lumbar pain 04/11/2016  . Neck pain on right side 03/04/2014  . Obesity (BMI 30-39.9) 11/07/2013  . Infection of urinary tract 10/21/2013  . 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 Acute blood loss anemia 06/26/2013  . H/O tobacco use, presenting hazards to health 02/07/2013  . Health care maintenance 02/06/2013  . Allergic rhinitis, cause unspecified 08/02/2011  . 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 HYPERCHOLESTEROLEMIA 11/23/2006  . TOBACCO DEPENDENCE 11/23/2006  . HYPERTENSION, BENIGN SYSTEMIC 11/23/2006  . HYDRADENITIS 11/23/2006  . 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 Biotin (BIOTIN 5000) 5 MG CAPS Take by mouth.    . cephALEXin (KEFLEX) 500 MG capsule Take 1 capsule (500 mg total) by mouth 2 (two) times daily. 14 capsule 0  . cyclobenzaprine (FLEXERIL) 5 MG tablet Take 1 tablet (5 mg total) by mouth at bedtime. 20 tablet 0  . fexofenadine (ALLEGRA) 30 MG tablet Take 30 mg by mouth 2 (two) times daily.    . Multiple Vitamin (MULTIVITAMIN) tablet Take 1 tablet by mouth daily.    . phenazopyridine (PYRIDIUM) 100 MG tablet Take 1 tablet (100 mg total) by mouth 3 (three) times daily as needed for pain. 10 tablet 0  . predniSONE (DELTASONE) 20 MG tablet Take 60mg  x 2 days, 40mg  x 2 days, 20mg  x 3 days 13 tablet 0  . Probiotic Product (PROBIOTIC DAILY PO) Take by mouth.    . ranitidine (ZANTAC) 150 MG tablet Take 150 mg by mouth 2 (two) times daily.    . vitamin B-12 (CYANOCOBALAMIN) 250 MCG tablet Take 250 mcg by mouth daily.     No current facility-administered medications for this visit.     Objective: Office vital signs reviewed. BP 134/74   Pulse 75   Temp 98.1 F (36.7 C) (Oral)   Ht 5\' 5"  (1.651 m)   Wt 206 lb (93.4 kg)   BMI 34.28 kg/m    Physical Examination:  General: Awake, alert, well- nourished, NAD Right hip: normal to inspection. Significant tenderness over the lateral trochanter. Full range of motion without tenderness. Positive  FABER test.   Procedure: Right trochanter bursa injection  Consent signed and scanned into record. Medication:  1 cc Solumedrol  4 cc Lidocaine with epi Preparation: area cleansed with alcohol and betadine Time Out taken  Landmarks identified 5 cc of medication injected into the trochanteric bursa on the R at the site of maximum tenderness Patient tolerated well without bleeding or paresthesias  Patient had good range of motion of joint after injection   Assessment/Plan: Trochanteric bursitis Patient with continued pain in the  R hip. No groin pain or limited ROM to suggest arthritis. Injection performed today. Discussed that she should continue to move that hip today and tomorrow. Exercises/stretches were provided for her to start in the next few days. Return precautions from the injection dicussed.  - continue NSAIDs and tylenol in moderate amounts give h/o gastric bypass - ice the area 20 minutes a few times per day.  - pt to call in if her symptoms do no improve within the next week, will then refer to sports medicine.    No orders of the defined types were placed in this encounter.   Meds ordered this encounter  Medications  . methylPREDNISolone acetate (DEPO-MEDROL) injection 40 mg    Joanna Puffrystal S. Dorsey PGY-3, [facility]

## 2016-06-22 NOTE — Assessment & Plan Note (Signed)
Patient with continued pain in the R hip. No groin pain or limited ROM to suggest arthritis. Injection performed today. Discussed that she should continue to move that hip today and tomorrow. Exercises/stretches were provided for her to start in the next few days. Return precautions from the injection dicussed.  - continue NSAIDs and tylenol in moderate amounts give h/o gastric bypass - ice the area 20 minutes a few times per day.  - pt to call in if her symptoms do no improve within the next week, will then refer to sports medicine.

## 2016-07-14 ENCOUNTER — Other Ambulatory Visit: Payer: Self-pay | Admitting: Family Medicine

## 2016-07-14 DIAGNOSIS — Z1231 Encounter for screening mammogram for malignant neoplasm of breast: Secondary | ICD-10-CM

## 2016-07-15 ENCOUNTER — Other Ambulatory Visit (HOSPITAL_COMMUNITY): Payer: Self-pay | Admitting: General Surgery

## 2016-07-15 DIAGNOSIS — Z9884 Bariatric surgery status: Secondary | ICD-10-CM

## 2016-08-02 ENCOUNTER — Other Ambulatory Visit (HOSPITAL_COMMUNITY): Payer: Self-pay | Admitting: General Surgery

## 2016-08-02 ENCOUNTER — Encounter (HOSPITAL_COMMUNITY): Payer: Self-pay | Admitting: Radiology

## 2016-08-02 ENCOUNTER — Ambulatory Visit (HOSPITAL_COMMUNITY)
Admission: RE | Admit: 2016-08-02 | Discharge: 2016-08-02 | Disposition: A | Discharge status: Home / Self Care | Payer: BC Managed Care – PPO | Source: Ambulatory Visit | Attending: General Surgery | Admitting: General Surgery

## 2016-08-02 DIAGNOSIS — K449 Diaphragmatic hernia without obstruction or gangrene: Secondary | ICD-10-CM | POA: Insufficient documentation

## 2016-08-02 DIAGNOSIS — K219 Gastro-esophageal reflux disease without esophagitis: Secondary | ICD-10-CM | POA: Diagnosis not present

## 2016-08-02 DIAGNOSIS — K224 Dyskinesia of esophagus: Secondary | ICD-10-CM | POA: Diagnosis not present

## 2016-08-02 DIAGNOSIS — Z9884 Bariatric surgery status: Secondary | ICD-10-CM

## 2016-08-03 ENCOUNTER — Ambulatory Visit: Payer: BC Managed Care – PPO

## 2016-08-05 ENCOUNTER — Ambulatory Visit (INDEPENDENT_AMBULATORY_CARE_PROVIDER_SITE_OTHER): Payer: BC Managed Care – PPO | Admitting: Family Medicine

## 2016-08-05 ENCOUNTER — Encounter: Payer: Self-pay | Admitting: Family Medicine

## 2016-08-05 VITALS — BP 120/61 | HR 95 | Temp 97.8°F | Ht 65.0 in | Wt 208.0 lb

## 2016-08-05 DIAGNOSIS — N390 Urinary tract infection, site not specified: Secondary | ICD-10-CM | POA: Diagnosis not present

## 2016-08-05 LAB — POCT UA - MICROSCOPIC ONLY

## 2016-08-05 LAB — POCT URINALYSIS DIPSTICK

## 2016-08-05 MED ORDER — SULFAMETHOXAZOLE-TRIMETHOPRIM 800-160 MG PO TABS: 1.0000 | ORAL_TABLET | Freq: Two times a day (BID) | ORAL | 0 refills | Status: DC

## 2016-08-05 NOTE — Patient Instructions (Signed)
Take daily Cranberry tablets.  Drink plenty of water.   Urinary Tract Infection Urinary tract infections (UTIs) can develop anywhere along your urinary tract. Your urinary tract is your body's drainage system for removing wastes and extra water. Your urinary tract includes two kidneys, two ureters, a bladder, and a urethra. Your kidneys are a pair of bean-shaped organs. Each kidney is about the size of your fist. They are located below your ribs, one on each side of your spine. CAUSES Infections are caused by microbes, which are microscopic organisms, including fungi, viruses, and bacteria. These organisms are so small that they can only be seen through a microscope. Bacteria are the microbes that most commonly cause UTIs. SYMPTOMS  Symptoms of UTIs may vary by age and gender of the patient and by the location of the infection. Symptoms in young women typically include a frequent and intense urge to urinate and a painful, burning feeling in the bladder or urethra during urination. Older women and men are more likely to be tired, shaky, and weak and have muscle aches and abdominal pain. A fever may mean the infection is in your kidneys. Other symptoms of a kidney infection include pain in your back or sides below the ribs, nausea, and vomiting. DIAGNOSIS To diagnose a UTI, your caregiver will ask you about your symptoms. Your caregiver will also ask you to provide a urine sample. The urine sample will be tested for bacteria and white blood cells. White blood cells are made by your body to help fight infection. TREATMENT  Typically, UTIs can be treated with medication. Because most UTIs are caused by a bacterial infection, they usually can be treated with the use of antibiotics. The choice of antibiotic and length of treatment depend on your symptoms and the type of bacteria causing your infection. HOME CARE INSTRUCTIONS  If you were prescribed antibiotics, take them exactly as your caregiver instructs  you. Finish the medication even if you feel better after you have only taken some of the medication.  Drink enough water and fluids to keep your urine clear or pale yellow.  Avoid caffeine, tea, and carbonated beverages. They tend to irritate your bladder.  Empty your bladder often. Avoid holding urine for long periods of time.  Empty your bladder before and after sexual intercourse.  After a bowel movement, women should cleanse from front to back. Use each tissue only once. SEEK MEDICAL CARE IF:   You have back pain.  You develop a fever.  Your symptoms do not begin to resolve within 3 days. SEEK IMMEDIATE MEDICAL CARE IF:   You have severe back pain or lower abdominal pain.  You develop chills.  You have nausea or vomiting.  You have continued burning or discomfort with urination. MAKE SURE YOU:   Understand these instructions.  Will watch your condition.  Will get help right away if you are not doing well or get worse.   This information is not intended to replace advice given to you by your health care provider. Make sure you discuss any questions you have with your health care provider.   Document Released: 06/22/2005 Document Revised: 06/03/2015 Document Reviewed: 10/21/2011 Elsevier Interactive Patient Education [employer]2016 Elsevier Inc.

## 2016-08-05 NOTE — Progress Notes (Signed)
   Subjective: CC: dysuria UJW:[id number]:Brenda Ware is a 50 y.o. female presenting to clinic today for same day appointment. PCP: Rodrigo Ranrystal Dorsey, MD Concerns today include:  Dysuria Patient reports she has had 2 UTIs in the last 4 months.  She report cloudy urine, dysuria, urinary frequency, urinary urgency, abdominal cramping.  She denies hematuria, new back pain, nausea, vomiting.  She took AZO yesterday and this morning for symptoms.  She reports she takes a lot of water.  No new sexual partners, no vaginal discharge, no increase in sexual activity.  Social History Reviewed: non smoker. FamHx and MedHx reviewed.  Please see EMR.  ROS: Per HPI  Objective: Office vital signs reviewed. BP 120/61   Pulse 95   Temp 97.8 F (36.6 C) (Oral)   Ht 5\' 5"  (1.651 m)   Wt 208 lb (94.3 kg)   SpO2 97%   BMI 34.61 kg/m   Physical Examination:  General: Awake, alert, well nourished, No acute distress Cardio: regular rate Pulm: normal WOB on room air GI: soft, obese GU: +suprapubic TTP, no CVA TTP  Results for orders placed or performed in visit on 08/05/16 (from the past 24 hour(s))  Urinalysis Dipstick     Status: Abnormal   Collection Time: 08/05/16 10:58 AM  Result Value Ref Range   Color, UA ORANGE    Clarity, UA CLEAR    Narrative   Unable to read biochemical tests results due to medication coloration of urine  POCT UA - Microscopic Only     Status: Abnormal   Collection Time: 08/05/16 10:58 AM  Result Value Ref Range   WBC, Ur, HPF, POC TOO MANY TO COUNT    RBC, urine, microscopic NONE    Bacteria, U Microscopic 1+    Epithelial cells, urine per micros 1-5     Assessment/ Plan: 50 y.o. female   1. Recurrent UTI, made clinically as UA unable to read because of AZO use.  No recent urine culture to review.  Last UCx 2015 w/ e coli that was pansensitive.  Patient has allergy to Avelox so no fluoroquinolone prescribed.  Will treat with Septra DS for 5 days given recurrence.  I  suspect the recurrence to be related to perimenopausal state.  However patient with a history of urologic issue in past requiring surgical intervention.  She has been referred back to her urologist. - Recommend daily use of Cranberry tablets - Ok to continue Pyridium for next 2 days then stop - Continue hydration - Urinalysis Dipstick - Ambulatory referral to Urology.  Will likely need prophylaxis - sulfamethoxazole-trimethoprim (BACTRIM DS,SEPTRA DS) 800-160 MG tablet; Take 1 tablet by mouth 2 (two) times daily.  Dispense: 10 tablet; Refill: 0 - Urine culture - Return precautions reviewed. - POCT UA - Microscopic Only  Follow up with PCP as needed.  Raliegh IpAshly M Gottschalk, DO PGY-3, [facility]

## 2016-08-07 LAB — URINE CULTURE: ORGANISM ID, BACTERIA: NO GROWTH

## 2016-08-10 ENCOUNTER — Encounter: Payer: Self-pay | Admitting: Family Medicine

## 2016-08-10 ENCOUNTER — Ambulatory Visit
Admission: RE | Admit: 2016-08-10 | Discharge: 2016-08-10 | Disposition: A | Discharge status: Home / Self Care | Payer: BC Managed Care – PPO | Source: Ambulatory Visit | Attending: Family Medicine | Admitting: Family Medicine

## 2016-08-10 DIAGNOSIS — Z1231 Encounter for screening mammogram for malignant neoplasm of breast: Secondary | ICD-10-CM

## 2016-09-15 ENCOUNTER — Telehealth: Payer: Self-pay | Admitting: *Deleted

## 2016-09-15 DIAGNOSIS — M25551 Pain in right hip: Secondary | ICD-10-CM

## 2016-09-15 NOTE — Telephone Encounter (Signed)
Pt calling in stating that the dr would put her in a referral to go to [facility] for her hip. Please advise. Anguel Delapena Bruna PotterBlount, CMA

## 2016-09-16 NOTE — Telephone Encounter (Signed)
Referral placed for R hip pain.  Brenda Puffrystal S. Dorsey, MD [facility] Resident  09/16/2016, 7:48 AM

## 2016-10-18 ENCOUNTER — Ambulatory Visit (INDEPENDENT_AMBULATORY_CARE_PROVIDER_SITE_OTHER): Payer: BC Managed Care – PPO | Admitting: Physician Assistant

## 2016-10-18 ENCOUNTER — Ambulatory Visit (INDEPENDENT_AMBULATORY_CARE_PROVIDER_SITE_OTHER): Payer: Self-pay

## 2016-10-18 DIAGNOSIS — M7061 Trochanteric bursitis, right hip: Secondary | ICD-10-CM | POA: Diagnosis not present

## 2016-10-18 DIAGNOSIS — M25551 Pain in right hip: Secondary | ICD-10-CM

## 2016-10-18 DIAGNOSIS — M5441 Lumbago with sciatica, right side: Secondary | ICD-10-CM

## 2016-10-18 NOTE — Progress Notes (Signed)
Office Visit Note   Patient: Brenda Ware           Date of Birth: August 25, 1966           MRN: [id number] Visit Date: 10/18/2016              Requested by: Joanna Puffrystal S Dorsey, MD [address] [place], [place] [id number] PCP: Rodrigo Ranrystal Dorsey, MD   Assessment & Plan: Visit Diagnoses:  1. Pain in right hip   2. Acute right-sided low back pain with right-sided sciatica   3. Trochanteric bursitis, right hip     Plan: I offered her a cortisone injection right trochanteric bursa she deferred. [facility] physical therapy to work on IT band stretching modalities to the right hip. Up in 6 weeks' check progress lack of. Patient is encouraged and answered by Dr. Magnus IvanBlackman and myself.  Follow-Up Instructions: Return in about 6 weeks (around 11/29/2016).   Orders:  Orders Placed This Encounter  Procedures  . XR HIP UNILAT W OR W/O PELVIS 1V RIGHT  . XR Lumbar Spine 2-3 Views   No orders of the defined types were placed in this encounter.     Procedures: No procedures performed   Clinical Data: No additional findings.   Subjective: Chief Complaint  Patient presents with  . Right Hip - Pain    Patient states she has had hip pain for about 10 mo. No injury. Denies any pain in the groin. She states she does have pain down her leg. States she does have some back pain, but not as bad as her hip    HPI Brenda Ware is a 51 year old female seen today for right hip pain for approximately 10 months. She's had no known injury to the right hip denies any groin pain. Pain primarily lateral aspect of the hip down to the popliteal region of the knee at times never radiates into the lower legs. HEENT is worse at night lying on the hip especially if she's done a lot of activity during the day. She's had a previous cortisone injection in the right hip which gave her relief for the day but pain came back shortly thereafter. Ibuprofen does help with pain. He has some low back pain that she states  this is minimal. Review of Systems See H PI  Objective: Vital Signs: There were no vitals taken for this visit.  Physical Exam  Constitutional: She is oriented to person, place, and time. She appears well-developed and well-nourished. No distress.  Cardiovascular: Intact distal pulses.   Pulmonary/Chest: Effort normal.  Neurological: She is alert and oriented to person, place, and time.    Ortho Exam Motion is 5 out of 5 strength throughout the lower extremities against resistance. She is able to come within an inch of touching her toes with flexion of the lumbar spine and has good extension without pain. Negative straight leg raise bilaterally. Excellent range of motion of both hips without pain. Tenderness over the right trochanteric region. Lateral knees good range of motion without pain left knee ecchymosis over the patella able to do straight leg raise bilaterally. No instability with valgus varus stressing of either knee. Nontender along the medial lateral joint line of both knees. Specialty Comments:  No specialty comments available.  Imaging: Xr Hip Unilat W Or W/o Pelvis 1v Right  Result Date: 10/18/2016 AP pelvis and lateral view of the right hip: No acute fracture either hip. Lateral hip joint is well maintained. No evidence of AVN  of either hip. No bony abnormalities otherwise  Xr Lumbar Spine 2-3 Views  Result Date: 10/18/2016 AP and lateral views lumbar spine: No acute fractures no spondylolisthesis. Disc space overall well-maintained. Minimal endplate spurring noted lumbar spine.    PMFS History: Patient Active Problem List   Diagnosis Date Noted  . Trochanteric bursitis 04/11/2016  . Lumbar pain 04/11/2016  . Neck pain on right side 03/04/2014  . Obesity (BMI 30-39.9) 11/07/2013  . Infection of urinary tract 10/21/2013  . 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 Acute blood loss anemia 06/26/2013  . H/O tobacco use, presenting  hazards to health 02/07/2013  . Health care maintenance 02/06/2013  . Allergic rhinitis, cause unspecified 08/02/2011  . 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 HYPERCHOLESTEROLEMIA 11/23/2006  . TOBACCO DEPENDENCE 11/23/2006  . HYPERTENSION, BENIGN SYSTEMIC 11/23/2006  . HYDRADENITIS 11/23/2006  . 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 GERD (gastroesophageal reflux disease) 06-17-13   no problems in several months  . 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 Morbid obesity (HCC)   . Seasonal allergies   . SUI (stress urinary incontinence, female) 06-17-13   no longer an issue s/p bladder sling    No family history on file.  Past Surgical History:  Procedure Laterality Date  . BLADDER SUSPENSION  04/17/2012   Procedure: [facility] PROCEDURE;  Surgeon: Martina Sinner, MD;  Location: [facility];  Service: Urology;  Laterality: N/A;  . CHOLECYSTECTOMY  1991  . HIATAL HERNIA REPAIR N/A 06/24/2013   Procedure: LAPAROSCOPIC REPAIR OF HIATAL HERNIA;  Surgeon: Atilano Ina, MD;  Location: [facility];  Service: General;  Laterality: N/A;  . LAPAROSCOPIC GASTRIC SLEEVE RESECTION N/A 06/24/2013   Procedure: LAPAROSCOPIC GASTRIC SLEEVE RESECTION;  Surgeon: Atilano Ina, MD;  Location: [facility];  Service: General;  Laterality: N/A;  . TUBAL LIGATION  1991  . UPPER GI ENDOSCOPY N/A 06/24/2013   Procedure: UPPER GI ENDOSCOPY;  Surgeon: Atilano Ina, MD;  Location: [facility];  Service: General;  Laterality: N/A;   Social History   Occupational History  . Not on file.   Social History Main Topics  . Smoking status: Former Smoker    Packs/day: 1.00    Years: 10.00    Types: Cigarettes    Quit date: 12/04/2012  . Smokeless tobacco: Never Used  . Alcohol use No  . Drug use: No  . Sexual activity: Yes

## 2016-11-29 ENCOUNTER — Ambulatory Visit (INDEPENDENT_AMBULATORY_CARE_PROVIDER_SITE_OTHER): Payer: BC Managed Care – PPO | Admitting: Orthopaedic Surgery

## 2017-09-05 ENCOUNTER — Other Ambulatory Visit: Payer: Self-pay | Admitting: Occupational Medicine

## 2017-09-05 ENCOUNTER — Ambulatory Visit: Payer: Self-pay

## 2017-09-05 DIAGNOSIS — M79641 Pain in right hand: Secondary | ICD-10-CM

## 2017-09-21 ENCOUNTER — Encounter: Payer: Self-pay | Admitting: Family Medicine

## 2017-09-21 ENCOUNTER — Ambulatory Visit: Payer: BC Managed Care – PPO | Admitting: Family Medicine

## 2017-09-21 ENCOUNTER — Other Ambulatory Visit: Payer: Self-pay

## 2017-09-21 DIAGNOSIS — M65341 Trigger finger, right ring finger: Secondary | ICD-10-CM | POA: Insufficient documentation

## 2017-09-21 NOTE — Progress Notes (Signed)
   Subjective:   Patient ID: Brenda Ware    DOB: 1966-07-04, 51 y.o. female   MRN: [id number]  CC: R hand injury   HPI: Brenda Ware is a 51 y.o. female who presents to clinic today for form completion.   Trigger finger of R ring finger  Patient states she is R-hand dominant and has been out of work for several days already.  Symptoms began 08/28/2017.  She works as a Surveyor, miningschool bus driver and developed trigger finger of her right ring finger from pulling on the park brakes.  She initially thought this was going to be a worker's comp case and saw a physician through the county on 08/29/2017 who excused her from work until 09/05/2017.  Worker's comp was denied as this was not accident-related and she saw Dr. Penni BombardKendall on 09/15/2017 at [facility] who started her on Prednisone for the flare and was told this could take up to 6 weeks to heal.  She states her pain is worse in the mornings and with using the hand.  Pain is otherwise well controlled if she is not using it.  She cannot make a fist and has been wearing a brace, icing it periodically and taking Ibuprofen.    ROS: Denies fevers, chills, nausea, vomiting, diarrhea.  Denies numbness, tingling.   PMFSH: Pertinent past medical, surgical, family, and social history were reviewed and updated as appropriate. Smoking status reviewed. Medications reviewed. Objective:   BP 108/72   Pulse 78   Temp 97.9 F (36.6 C) (Oral)   Ht 5\' 5"  (1.651 m)   Wt 217 lb 6.4 oz (98.6 kg)   SpO2 99%   BMI 36.18 kg/m  Vitals and nursing note reviewed.  General: 51 yo female, NAD   CV: RRR no MRG  Lungs: CTAB, non-laboured  Skin: warm, dry, no rash  Extremities: R hand - no obvious deformity noted, no erythema, swelling or warmth surrounding joints, ROM is restricted 2/2 pain, unable to make a fist, able to wiggle fingers, TTP with palpation of 4th PIP and MCP  Neuro: neurovascularly intact distally, normal sensation    Assessment & Plan:    Trigger finger, right ring finger Stiffness and pain of R ring finger likely c/w trigger finger. Not associated with numbness or tingling, although ROM restricted.  No red flags on exam and patient is otherwise well-appearing.  Date of injury: 08/28/2017.  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    Has been seen by Dr. Penni BombardKendall [facility]), is currently using Ibuprofen for pain, Prednisone, ice application and using brace.  Symptoms appear to be improving and pain well controlled.  -Anticipate will take about 6 weeks to heal  -FMLA forms completed today and given to pt -Work note provided  -Recommend f/u in 1 month   Freddrick MarchYashika Malaquias Lenker, MD [facility], PGY-2 09/21/2017 12:07 PM

## 2017-09-21 NOTE — Patient Instructions (Signed)
You were seen in clinic for right hand pain related to trigger finger and I filled out your FMLA forms.  I would advise you to keep your follow up appointment with the Orthopedist to evaluate other options if the prednisone does not improve the flare.  Please call clinic if you have any questions and I hope you start to feel better soon!   Be well, Freddrick MarchYashika Karlo Goeden, MD

## 2017-09-21 NOTE — Assessment & Plan Note (Addendum)
Stiffness and pain of R ring finger likely c/w trigger finger. Not associated with numbness or tingling, although ROM restricted.  No red flags on exam and patient is otherwise well-appearing.  Date of injury: 08/28/2017.  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    Has been seen by Dr. Penni BombardKendall [facility]), is currently using Ibuprofen for pain, Prednisone, ice application and using brace.  Symptoms appear to be improving and pain well controlled.  -Anticipate will take about 6 weeks to heal  -FMLA forms completed today and given to pt -Work note provided  -Recommend f/u in 1 month

## 2017-11-06 ENCOUNTER — Encounter: Payer: Self-pay | Admitting: Family Medicine

## 2017-11-06 NOTE — Progress Notes (Deleted)
   Subjective   Patient ID: Brenda Ware    DOB: [birth year], 52 y.o. female   MRN: [id number]  CC: "***"  HPI: Brenda Ware is a 52 y.o. female who presents to clinic today for the following:  ***: ***  ROS: see HPI for pertinent.  PMFSH: HTN, HLD, tobacco use disorder, obesity, urinary incontinence, trigger finger, hydroadenitis.  Surgical history gastric sleeve, hiatal hernia, cholecystectomy, bladder suspension, tubal.  Family history unremarkable.  Smoking status reviewed. Medications reviewed.  Objective   There were no vitals taken for this visit. Vitals and nursing note reviewed.  General: well nourished, well developed, NAD with non-toxic appearance HEENT: normocephalic, atraumatic, moist mucous membranes Neck: supple, non-tender without lymphadenopathy Cardiovascular: regular rate and rhythm without murmurs, rubs, or gallops Lungs: clear to auscultation bilaterally with normal work of breathing Abdomen: soft, non-tender, non-distended, normoactive bowel sounds Skin: warm, dry, no rashes or lesions, cap refill < 2 seconds Extremities: warm and well perfused, normal tone, no edema  Assessment & Plan   No problem-specific Assessment & Plan notes found for this encounter.  No orders of the defined types were placed in this encounter.  No orders of the defined types were placed in this encounter.   Durward Parcelavid McMullen, DO [facility], PGY-2 11/06/2017, 5:28 PM

## 2017-11-07 ENCOUNTER — Ambulatory Visit: Payer: Self-pay | Admitting: Family Medicine

## 2017-11-09 ENCOUNTER — Other Ambulatory Visit: Payer: Self-pay | Admitting: Family Medicine

## 2017-11-09 DIAGNOSIS — Z1231 Encounter for screening mammogram for malignant neoplasm of breast: Secondary | ICD-10-CM

## 2017-11-17 ENCOUNTER — Ambulatory Visit: Payer: Self-pay

## 2017-11-28 ENCOUNTER — Telehealth: Payer: Self-pay | Admitting: Family Medicine

## 2017-11-28 NOTE — Telephone Encounter (Signed)
Placed in MDs box to be filled out. Brenda Ware, CMA  

## 2017-11-28 NOTE — Telephone Encounter (Signed)
Short term disability form dropped off for at front desk for completion.  Verified that patient section of form has been completed.  Last DOS  was 09/21/17.  Placed form in red team folder to be completed by clinical staff.  Lina Sarheryl A Stanley

## 2017-12-01 NOTE — Telephone Encounter (Signed)
MD brought forms to nurses office- unsure what dx patient is needing STD forms for. Called patient, she states it is for her trigger finger. Advised pt would inform MD and see if this is a dx we can use for STD. MD sent text page regarding dx and forms placed in box. Shawna OrleansMeredith B Demetria Iwai, RN

## 2017-12-01 NOTE — Telephone Encounter (Signed)
Pt called about her disablity forms. They are due back to her employer today. The forms are not in drs box and they are not up front-ready for pickup.  Please advise

## 2017-12-05 NOTE — Telephone Encounter (Signed)
Please call patient and tell her she needs to follow-up with the surgeon to receive paperwork guarding disability.  I have not seen her before.  Durward Parcelavid McMullen, DO [facility], PGY-2

## 2017-12-05 NOTE — Telephone Encounter (Signed)
Pt calling back about this form. The form is in the doctors box ready to be completed. Pt needed it completed last Friday, but would like it to be completed asap. Call her when it has been completed and is ready for her to pick up. Please advise

## 2017-12-06 NOTE — Telephone Encounter (Signed)
Pt informed and she states that she made an appt with you Friday. Deseree Bruna PotterBlount, CMA

## 2017-12-07 NOTE — Progress Notes (Signed)
   Subjective   Patient ID: Brenda Ware    DOB: 05-08-1966, 52 y.o. female   MRN: [id number]  CC: " Trigger finger"  HPI: Brenda Ware is a 52 y.o. female who presents to clinic today for the following:  Trigger finger: Onset 08/29/2017.  [facility] and failed steroid injection and subsequently received surgery.  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  Patient states she is unable to use her right hand due to need for continual access to parking brake while driving.  She is here today for evaluation of her trigger finger and to receive paperwork for her short-term disability through the date 02/22/2018.  She states that she has regained some function but has significant pain especially with grasping and would be unable to perform her job at this time.  She has been compliant with ibuprofen use and has a scheduled appointment for follow-up with orthopedic surgery in May.  ROS: see HPI for pertinent.  PMFSH: HTN, HLD, tobacco use disorder, obesity, urinary incontinence, hidradenitis.  Surgical history gastric sleeve, hiatal hernia, cholecystectomy, bladder suspension, tubal.  Family history unremarkable.  Smoking status reviewed. Medications reviewed.  Objective   BP 110/80   Pulse 80   Temp 98.3 F (36.8 C) (Oral)   Ht 5\' 5"  (1.651 m)   Wt 219 lb (99.3 kg)   SpO2 97%   BMI 36.44 kg/m  Vitals and nursing note reviewed.  General: well nourished, well developed, NAD with non-toxic appearance HEENT: normocephalic, atraumatic, moist mucous membranes Cardiovascular: regular rate and rhythm without murmurs, rubs, or gallops Lungs: clear to auscultation bilaterally with normal work of breathing Skin: warm, dry, no rashes or lesions, cap refill < 2 seconds Extremities: warm and well perfused, normal tone, no edema MSK: right hand with healing scar at ring finger metacarpal with minimal  edema and significant tenderness palpation, active range of motion intact though limited with complete flexion due to tenderness, neurovascular intact  Assessment & Plan   Trigger finger, right ring finger Chronic.  Currently recovering from surgery.  Continues to have significant tenderness with flexion and inability to use right hand.  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 - FMLA paperwork completed to clear patient from work until 02/22/2018 - Patient to follow-up with orthopedic surgery scheduled appointment with me on as-needed basis  No orders of the defined types were placed in this encounter.  No orders of the defined types were placed in this encounter.   Durward Parcelavid Alisa Stjames, DO [facility], PGY-2 12/08/2017, 12:23 PM

## 2017-12-08 ENCOUNTER — Encounter: Payer: Self-pay | Admitting: Family Medicine

## 2017-12-08 ENCOUNTER — Other Ambulatory Visit: Payer: Self-pay

## 2017-12-08 ENCOUNTER — Ambulatory Visit: Payer: BC Managed Care – PPO | Admitting: Family Medicine

## 2017-12-08 DIAGNOSIS — M65341 Trigger finger, right ring finger: Secondary | ICD-10-CM

## 2017-12-08 NOTE — Patient Instructions (Signed)
Thank you for coming in to see us today. Please see below to review our plan for today's visit.  I have filled out your paperwork for short-term disability to extend through 02/22/2018.  Follow-up with your surgeon.  Please call the clinic at [phone] if your symptoms worsen or you have any concerns. It was our pleasure to serve you.  Durward Parcelavid McMullen, DO [facility], PGY-2

## 2017-12-08 NOTE — Assessment & Plan Note (Signed)
Chronic.  Currently recovering from surgery.  Continues to have significant tenderness with flexion and inability to use right hand.  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 - FMLA paperwork completed to clear patient from work until 02/22/2018 - Patient to follow-up with orthopedic surgery scheduled appointment with me on as-needed basis

## 2018-08-09 ENCOUNTER — Emergency Department (HOSPITAL_BASED_OUTPATIENT_CLINIC_OR_DEPARTMENT_OTHER)
Admission: EM | Admit: 2018-08-09 | Discharge: 2018-08-09 | Disposition: A | Discharge status: Home / Self Care | Payer: BC Managed Care – PPO | Attending: Emergency Medicine | Admitting: Emergency Medicine

## 2018-08-09 ENCOUNTER — Other Ambulatory Visit: Payer: Self-pay

## 2018-08-09 ENCOUNTER — Encounter (HOSPITAL_BASED_OUTPATIENT_CLINIC_OR_DEPARTMENT_OTHER): Payer: Self-pay | Admitting: *Deleted

## 2018-08-09 DIAGNOSIS — Y999 Unspecified external cause status: Secondary | ICD-10-CM | POA: Insufficient documentation

## 2018-08-09 DIAGNOSIS — W260XXA Contact with knife, initial encounter: Secondary | ICD-10-CM | POA: Diagnosis not present

## 2018-08-09 DIAGNOSIS — Y92000 Kitchen of unspecified non-institutional (private) residence as  the place of occurrence of the external cause: Secondary | ICD-10-CM | POA: Diagnosis not present

## 2018-08-09 DIAGNOSIS — S61012A Laceration without foreign body of left thumb without damage to nail, initial encounter: Secondary | ICD-10-CM | POA: Diagnosis present

## 2018-08-09 DIAGNOSIS — Z79899 Other long term (current) drug therapy: Secondary | ICD-10-CM | POA: Diagnosis not present

## 2018-08-09 DIAGNOSIS — Z87891 Personal history of nicotine dependence: Secondary | ICD-10-CM | POA: Insufficient documentation

## 2018-08-09 DIAGNOSIS — Y9389 Activity, other specified: Secondary | ICD-10-CM | POA: Diagnosis not present

## 2018-08-09 MED ORDER — BACITRACIN ZINC 500 UNIT/GM EX OINT: TOPICAL_OINTMENT | Freq: Once | CUTANEOUS | Status: DC

## 2018-08-09 NOTE — ED Notes (Signed)
Bacitracin applied to wound.

## 2018-08-09 NOTE — ED Notes (Signed)
Pt lac cleansed with SafeCleanse and sterile water

## 2018-08-09 NOTE — Discharge Instructions (Signed)
Keep the wound clean and dry for the first 24 hours. After that you may gently clean the wound with soap and water. Make sure to pat dry the wound before covering it with any dressing. You can use topical antibiotic ointment and bandage. Ice and elevate for pain relief.   You can take Tylenol or Ibuprofen as directed for pain. You can alternate Tylenol and Ibuprofen every 4 hours for additional pain relief.   As we discussed, in 5 days, try tugging out the stitches. If you meet resistance, wait 2 more days to try and get the stitches out.  At the, easily, remove them.  Monitor closely for any signs of infection. Return to the Emergency Department for any worsening redness/swelling of the area that begins to spread, drainage from the site, worsening pain, fever or any other worsening or concerning symptoms.

## 2018-08-09 NOTE — ED Provider Notes (Signed)
[facility] EMERGENCY DEPARTMENT Provider Note   CSN: [id number] Arrival date & time: 08/09/18  2152     History   Chief Complaint Chief Complaint  Patient presents with  . Laceration    HPI Brenda Ware is a 52 y.o. female history of GERD who presents for evaluation of left thumb laceration that occurred approximately 1 hour prior to ED arrival.  Patient reports she was in the kitchen and states that she was using a knife and accidentally cut the volar aspect of her left thumb.  She reports her tetanus was in the last 5 years.  Patient denies any numbness/weakness.  She does not currently on any blood thinners.  The history is provided by the patient.    Past Medical History:  Diagnosis Date  . GERD (gastroesophageal reflux disease) 06-17-13   no problems in several months  . 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 Morbid obesity (HCC)   . Seasonal allergies   . SUI (stress urinary incontinence, female) 06-17-13   no longer an issue s/p bladder sling    Patient Active Problem List   Diagnosis Date Noted  . Trigger finger, right ring finger 09/21/2017  . Obesity (BMI 30-39.9) 11/07/2013  . 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 Urinary incontinence, mixed 02/02/2007  . Hyperlipidemia 11/23/2006  . Tobacco use disorder 11/23/2006  . Primary hypertension 11/23/2006  . Hydradenitis 11/23/2006    Past Surgical History:  Procedure Laterality Date  . BLADDER SUSPENSION  04/17/2012   Procedure: [facility] PROCEDURE;  Surgeon: Martina Sinner, MD;  Location: [facility];  Service: Urology;  Laterality: N/A;  . CHOLECYSTECTOMY  1991  . HIATAL HERNIA REPAIR N/A 06/24/2013   Procedure: LAPAROSCOPIC REPAIR OF HIATAL HERNIA;  Surgeon: Atilano Ina, MD;  Location: [facility];  Service: General;  Laterality: N/A;  . LAPAROSCOPIC GASTRIC  SLEEVE RESECTION N/A 06/24/2013   Procedure: LAPAROSCOPIC GASTRIC SLEEVE RESECTION;  Surgeon: Atilano Ina, MD;  Location: [facility];  Service: General;  Laterality: N/A;  . TUBAL LIGATION  1991  . UPPER GI ENDOSCOPY N/A 06/24/2013   Procedure: UPPER GI ENDOSCOPY;  Surgeon: Atilano Ina, MD;  Location: [facility];  Service: General;  Laterality: N/A;     OB History   None      Home Medications    Prior to Admission medications   Medication Sig Start Date End Date Taking? Authorizing Provider  Multiple Vitamin (MULTIVITAMIN) tablet Take 1 tablet by mouth daily.   Yes [provider]  ranitidine (ZANTAC) 150 MG tablet Take 150 mg by mouth 2 (two) times daily.   Yes [provider]  Biotin (BIOTIN 5000) 5 MG CAPS Take by mouth.    [provider]  cyclobenzaprine (FLEXERIL) 5 MG tablet Take 1 tablet (5 mg total) by mouth at bedtime. 04/11/16   Joanna Puff, MD  fexofenadine (ALLEGRA) 30 MG tablet Take 30 mg by mouth 2 (two) times daily.    [provider]  phenazopyridine (PYRIDIUM) 100 MG tablet Take 1 tablet (100 mg total) by mouth 3 (three) times daily as needed for pain. 06/13/16   Marquette Saa, MD  predniSONE (DELTASONE) 20 MG tablet Take 60mg  x 2 days, 40mg  x 2 days, 20mg  x 3 days 04/11/16   Joanna Puff, MD  Probiotic Product (PROBIOTIC DAILY PO) Take by mouth.  [provider]  sulfamethoxazole-trimethoprim (BACTRIM DS,SEPTRA DS) 800-160 MG tablet Take 1 tablet by mouth 2 (two) times daily. 08/05/16   Raliegh Ip, DO  vitamin B-12 (CYANOCOBALAMIN) 250 MCG tablet Take 250 mcg by mouth daily.    [provider]    Family History No family history on file.  Social History Social History   Tobacco Use  . Smoking status: Former Smoker    Packs/day: 1.00    Years: 10.00    Pack years: 10.00    Types: Cigarettes    Last attempt to quit: 12/04/2012    Years since quitting: 5.6  . Smokeless tobacco:  Never Used  Substance Use Topics  . Alcohol use: No  . Drug use: No     Allergies   Avelox [moxifloxacin hcl in nacl]; Doxycycline; and Vicodin [hydrocodone-acetaminophen]   Review of Systems Review of Systems  Skin: Positive for wound.  Neurological: Negative for weakness and numbness.  All other systems reviewed and are negative.    Physical Exam Updated Vital Signs BP 136/81 (BP Location: Left Arm)   Pulse 91   Temp 98 F (36.7 C) (Oral)   Resp 20   Ht 5\' 5"  (1.651 m)   Wt 86.2 kg   SpO2 98%   BMI 31.62 kg/m   Physical Exam  Constitutional: She appears well-developed and well-nourished.  HENT:  Head: Normocephalic and atraumatic.  Eyes: Conjunctivae and EOM are normal. Right eye exhibits no discharge. Left eye exhibits no discharge. No scleral icterus.  Cardiovascular:  Pulses:      Radial pulses are 2+ on the right side, and 2+ on the left side.  Pulmonary/Chest: Effort normal.  Musculoskeletal:  Action/extension of thumb intact without any difficulty.  Patient can easily touch her pinky with her thumb.  No tenderness palpation noted to all other digits, no difficulty moving other digits of the hand.  Neurological: She is alert.  Skin: Skin is warm and dry.  1.5 cm vertical linear laceration under the volar aspect of the thumb that is distal to the IP joint.  Psychiatric: She has a normal mood and affect. Her speech is normal and behavior is normal.  Nursing note and vitals reviewed.    ED Treatments / Results  Labs (all labs ordered are listed, but only abnormal results are displayed) Labs Reviewed - No data to display  EKG None  Radiology No results found.  Procedures .Marland KitchenLaceration Repair Date/Time: 08/09/2018 11:38 PM Performed by: Maxwell Caul, PA-C Authorized by: Maxwell Caul, PA-C   Consent:    Consent obtained:  Verbal   Consent given by:  Patient   Risks discussed:  Infection, need for additional repair, pain, poor cosmetic  result and poor wound healing   Alternatives discussed:  No treatment and delayed treatment Universal protocol:    Procedure explained and questions answered to patient or proxy's satisfaction: yes     Relevant documents present and verified: yes     Test results available and properly labeled: yes     Imaging studies available: yes     Required blood products, implants, devices, and special equipment available: yes     Site/side marked: yes     Immediately prior to procedure, a time out was called: yes     Patient identity confirmed:  Verbally with patient Anesthesia (see MAR for exact dosages):    Anesthesia method:  Local infiltration   Local anesthetic:  Bupivacaine 0.5% w/o epi Laceration details:  Location:  Finger   Finger location:  L thumb   Length (cm):  1.5 Repair type:    Repair type:  Simple Pre-procedure details:    Preparation:  Patient was prepped and draped in usual sterile fashion Exploration:    Hemostasis achieved with:  Direct pressure   Wound extent: no foreign bodies/material noted   Treatment:    Area cleansed with:  Betadine   Amount of cleaning:  Extensive   Irrigation solution:  Sterile saline   Irrigation method:  Syringe   Visualized foreign bodies/material removed: no   Skin repair:    Repair method:  Sutures   Suture size:  5-0   Wound skin closure material used: Vicryl rapide.   Suture technique:  Simple interrupted   Number of sutures:  3 Approximation:    Approximation:  Close Post-procedure details:    Dressing:  Antibiotic ointment and non-adherent dressing   (including critical care time)  Medications Ordered in ED Medications  bacitracin ointment (has no administration in time range)     Initial Impression / Assessment and Plan / ED Course  I have reviewed the triage vital signs and the nursing notes.  Pertinent labs & imaging results that were available during my care of the patient were reviewed by me and considered in my  medical decision making (see chart for details).     52 year old female who presents for evaluation of left thumb laceration that occurred approxi-1 hour prior to ED arrival.  She reports she was using a knife in the kitchen when it slipped, causing a vertical ulnar laceration noted to the volar aspect of her thumb.  She reports that her tetanus is up-to-date. Patient is afebrile, non-toxic appearing, sitting comfortably on examination table. Vital signs reviewed and stable. Patient is neurovascularly intact.  On exam, she has a 1.5 cm linear laceration to the volar aspect of the thumb.  It does not extend over to the IP joint.  Good range of motion.  Discussed with patient regarding wound care closing and patient does not wish to have stitches at this time.  Laceration repaired as documented above.  Patient tolerated procedure well.  After wound anesthetized, it was thoroughly and extensively irrigated.  No evidence of foreign body noted in exam.  Discussed patient regarding wound care at home. Patient had ample opportunity for questions and discussion. All patient's questions were answered with full understanding. Strict return precautions discussed. Patient expresses understanding and agreement to plan.    Final Clinical Impressions(s) / ED Diagnoses   Final diagnoses:  Laceration of left thumb without foreign body, nail damage status unspecified, initial encounter    ED Discharge Orders    None       Maxwell CaulLayden, Sufyaan Palma A, PA-C 08/10/18 0112    Alvira MondaySchlossman, Erin, MD 08/10/18 1157

## 2018-08-09 NOTE — ED Triage Notes (Signed)
Laceration to her left thumb on a kitchen knife.

## 2019-02-19 ENCOUNTER — Encounter: Payer: Self-pay | Admitting: General Surgery

## 2019-02-19 NOTE — Progress Notes (Signed)
TELEHEALTH VISIT  Referring Provider: Greer Pickerel, MD Primary Care Physician:  Archie Patten, MD   Tele-visit due to COVID-19 pandemic Patient requested visit virtually, consented to the virtual encounter via video enabled telemedicine application. Unable to maintain video encounter through Zoom. Switched to audio encounter.  Contact made at: 10:00 am 02/20/19 Patient verified by name and date of birth Location of patient: Home Location provider: [place] [facility] Names of persons participating: Me, patient, Tinnie Gens CMA Time spent on telehealth visit: 24 minutes I discussed the limitations of evaluation and management by telemedicine. The patient expressed understanding and agreed to proceed.  Reason for Consultation: Acid reflux   IMPRESSION:  Reflux despite medical therapy    - mild reflux noted on UGI 08/02/16    - symptoms persist despite pantoprazole 40 mg daily 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 Mild esophageal dysmotility noted on UGI 08/02/16 Tiny sliding hiatal hernia on UGI 08/02/16 BMI 36.61 No prior colon cancer screening  I have recommended an EGD with esophageal and gastric biopsies given the differential of reflux esophagitis, persistent H pylori, PUD, gastritis, and non-erosive reflux disease. Plan PPI BID.   PLAN: Increase pantoprazole 40 mg BID EGD with esophageal biopsies Screening colonoscopy  I consented the patient at the bedside today discussing the risks, benefits, and alternatives to endoscopic evaluation. In particular, we discussed the risks that include, but are not limited to, reaction to medication, cardiopulmonary compromise, bleeding requiring blood transfusion, aspiration resulting in pneumonia, perforation requiring surgery, lack of diagnosis, severe illness requiring hospitalization, and even death. We reviewed the risk of missed lesion including polyps or even cancer. The patient acknowledges these risks and asks that we proceed.   HPI: Brenda Ware is a 53 y.o. female referred by Dr. Redmond Pulling with [facility]. The history is obtained through the patient, review of her referral records, and review of her electronic health record.  History of gastric sleeve resection in 2014.   Heartburn and reflux since her surgery, but worse in recent months. "I'm getting tired of it."  Burning pain and eating within 20-30 minutes with any po intake. Resolves within an hour.  Feels like she needs to regurgitate but doesn't.  Very rare nausea and vomiting. Exacerbated when supine. Improved with pantoprazole 40 mg QAM but she does not want to be on daily medications indefinitely. Incomplete response to Gaviscon prior to starting to pantoprazole. No other associated symptoms. No identified exacerbating or relieving features.   The patient denies alarm symptoms including anemia, dysphagia, early satiety, hematemesis, hematochezia, melena, odynophagia, and weight loss.   There are no recent records in [facility] or [facility]where. No prior endoscopic evaluation. UGI 06/25/13 after her gastric sleeve was normal. UGI 08/02/16 obtained for reflux showed a mild esophageal dysmotility, no stricture, a tiny hiatal hernia, and mild reflux.  No known family history of colon cancer or polyps. No family history of uterine/endometrial cancer, pancreatic cancer or gastric/stomach cancer.  Past Medical History:  Diagnosis Date  . GERD (gastroesophageal reflux disease) 06-17-13   no problems in several months  . 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 Morbid obesity ([place])   . Seasonal allergies   . SUI (stress urinary incontinence, female) 06-17-13   no longer an issue s/p bladder sling    Past Surgical History:  Procedure Laterality Date  . BLADDER SUSPENSION  04/17/2012   Procedure: [facility] PROCEDURE;  Surgeon:  Reece Packer, MD;  Location: [facility];  Service: Urology;  Laterality: N/A;  . CHOLECYSTECTOMY  1991  . HIATAL HERNIA REPAIR N/A 06/24/2013   Procedure: LAPAROSCOPIC REPAIR OF HIATAL HERNIA;  Surgeon: Gayland Curry, MD;  Location: [facility];  Service: General;  Laterality: N/A;  . LAPAROSCOPIC GASTRIC SLEEVE RESECTION N/A 06/24/2013   Procedure: LAPAROSCOPIC GASTRIC SLEEVE RESECTION;  Surgeon: Gayland Curry, MD;  Location: [facility];  Service: General;  Laterality: N/A;  . TUBAL LIGATION  1991  . UPPER GI ENDOSCOPY N/A 06/24/2013   Procedure: UPPER GI ENDOSCOPY;  Surgeon: Gayland Curry, MD;  Location: [facility];  Service: General;  Laterality: N/A;    Current Outpatient Medications  Medication Sig Dispense Refill  . Biotin (BIOTIN 5000) 5 MG CAPS Take by mouth.    . fexofenadine (ALLEGRA) 30 MG tablet Take 30 mg by mouth 2 (two) times daily.    . Multiple Vitamin (MULTIVITAMIN) tablet Take 1 tablet by mouth daily.    . Probiotic Product (PROBIOTIC DAILY PO) Take by mouth.    . Na Sulfate-K Sulfate-Mg Sulf 17.5-3.13-1.6 GM/177ML SOLN Take 1 kit by mouth as directed for 30 days. 354 mL 0  . pantoprazole (PROTONIX) 40 MG tablet Take 1 tablet (40 mg total) by mouth 2 (two) times daily before a meal. 60 tablet 3   No current facility-administered medications for this visit.     Allergies as of 02/20/2019 - Review Complete 02/20/2019  Allergen Reaction Noted  . Avelox [moxifloxacin hcl in nacl] Other (See Comments) 04/17/2012  . Doxycycline Other (See Comments) 08/01/2011  . Vicodin [hydrocodone-acetaminophen] Other (See Comments) 04/17/2012    History reviewed. No pertinent family history.  Social History   Socioeconomic History  . Marital status: Married    Spouse name: Not on file  . Number of children: Not on file  . Years of education: Not on file  . Highest education level: Not on file  Occupational History  . Not on file  Social Needs  . Financial resource strain: Not on file  . Food insecurity:    Worry:  Not on file    Inability: Not on file  . Transportation needs:    Medical: Not on file    Non-medical: Not on file  Tobacco Use  . Smoking status: Former Smoker    Packs/day: 1.00    Years: 10.00    Pack years: 10.00    Types: Cigarettes    Last attempt to quit: 12/04/2012    Years since quitting: 6.2  . Smokeless tobacco: Never Used  Substance and Sexual Activity  . Alcohol use: No  . Drug use: No  . Sexual activity: Yes  Lifestyle  . Physical activity:    Days per week: Not on file    Minutes per session: Not on file  . Stress: Not on file  Relationships  . Social connections:    Talks on phone: Not on file    Gets together: Not on file    Attends religious service: Not on file    Active member of club or organization: Not on file    Attends meetings of clubs or organizations: Not on file    Relationship status: Not on file  . Intimate partner violence:    Fear of current or ex partner: Not on file    Emotionally abused: Not on file    Physically abused: Not on file    Forced sexual activity: Not on  file  Other Topics Concern  . Not on file  Social History Narrative  . Not on file    Review of Systems: ALL ROS discussed and all others negative except listed in HPI.  Physical Exam: General: in no acute distress Neuro: Alert and appropriate Psych: Normal affect and normal insight   Shlomie Romig L. Tarri Glenn, MD, MPH [facility] 02/20/2019, 1:15 PM

## 2019-02-20 ENCOUNTER — Other Ambulatory Visit: Payer: Self-pay

## 2019-02-20 ENCOUNTER — Encounter: Payer: Self-pay | Admitting: Gastroenterology

## 2019-02-20 ENCOUNTER — Ambulatory Visit (INDEPENDENT_AMBULATORY_CARE_PROVIDER_SITE_OTHER): Payer: BC Managed Care – PPO | Admitting: Gastroenterology

## 2019-02-20 VITALS — Ht 65.0 in | Wt 220.0 lb

## 2019-02-20 DIAGNOSIS — Z1211 Encounter for screening for malignant neoplasm of colon: Secondary | ICD-10-CM | POA: Diagnosis not present

## 2019-02-20 DIAGNOSIS — K219 Gastro-esophageal reflux disease without esophagitis: Secondary | ICD-10-CM

## 2019-02-20 MED ORDER — PANTOPRAZOLE SODIUM 40 MG PO TBEC: 40.0000 mg | DELAYED_RELEASE_TABLET | Freq: Two times a day (BID) | ORAL | 3 refills | Status: DC

## 2019-02-20 MED ORDER — NA SULFATE-K SULFATE-MG SULF 17.5-3.13-1.6 GM/177ML PO SOLN: 1.0000 | ORAL | 0 refills | Status: AC

## 2019-02-20 NOTE — Patient Instructions (Signed)
I recommend that you increase your pantoprazole to 40 mg twice daily.  I am recommending an upper endoscopy (EGD) to evaluate your reflux and look at your stomach after your surgery.  I am recommending a colonoscopy for colon cancer screening.  Thank you for your patience with me and our technology today! Please stay home, safe, and healthy. I look forward to meeting you in person in the future.

## 2019-02-26 ENCOUNTER — Telehealth: Payer: Self-pay | Admitting: *Deleted

## 2019-02-26 NOTE — Telephone Encounter (Signed)
Left message for first COVID screen attempt. SM 

## 2019-02-27 ENCOUNTER — Telehealth: Payer: Self-pay | Admitting: *Deleted

## 2019-02-27 NOTE — Telephone Encounter (Signed)
Covid-19 screening questions  Have you traveled in the last 14 days? no If yes where?  Do you now or have you had a fever in the last 14 days? no  Do you have any respiratory symptoms of shortness of breath or cough now or in the last 14 days? no  Do you have any family members or close contacts with diagnosed or suspected Covid-19 in the past 14 days? no  Have you been tested for Covid-19 and found to be positive? No  Pt is aware that care partner will wait in the car during parking lot; if they feel like they will be too hot to wait in the car; they may wait in the lobby.  We want them to wear a mask (we do not have any that we can provide them), practice social distancing, and we will check their temperatures when they get here.  I did remind patient that their care partner needs to stay in the parking lot the entire time. Pt will wear mask into building  Pt states she wants to cancel her colonoscopy at this time; she didn't want to give me a reason as to why and didn't want to reschedule that.  She wants to continue with her EGD.

## 2019-02-27 NOTE — Telephone Encounter (Signed)
Dr. Orvan Falconer, Just an FYI-   Pt states she wants to cancel her colonoscopy at this time; she didn't want to give me a reason as to why and didn't want to reschedule that.  She wants to continue with her EGD.  Baxter Hire

## 2019-02-27 NOTE — Telephone Encounter (Signed)
Noted. Thanks.

## 2019-02-28 ENCOUNTER — Encounter: Payer: Self-pay | Admitting: Gastroenterology

## 2019-02-28 ENCOUNTER — Other Ambulatory Visit: Payer: Self-pay

## 2019-02-28 ENCOUNTER — Ambulatory Visit (AMBULATORY_SURGERY_CENTER): Payer: BC Managed Care – PPO | Admitting: Gastroenterology

## 2019-02-28 VITALS — BP 137/85 | HR 88 | Temp 98.5°F | Resp 15 | Ht 65.0 in | Wt 220.0 lb

## 2019-02-28 DIAGNOSIS — K449 Diaphragmatic hernia without obstruction or gangrene: Secondary | ICD-10-CM

## 2019-02-28 DIAGNOSIS — K3189 Other diseases of stomach and duodenum: Secondary | ICD-10-CM | POA: Diagnosis not present

## 2019-02-28 DIAGNOSIS — K210 Gastro-esophageal reflux disease with esophagitis: Secondary | ICD-10-CM | POA: Diagnosis not present

## 2019-02-28 DIAGNOSIS — K2970 Gastritis, unspecified, without bleeding: Secondary | ICD-10-CM

## 2019-02-28 DIAGNOSIS — K219 Gastro-esophageal reflux disease without esophagitis: Secondary | ICD-10-CM

## 2019-02-28 MED ORDER — SODIUM CHLORIDE 0.9 % IV SOLN: 500.0000 mL | Freq: Once | INTRAVENOUS | Status: DC

## 2019-02-28 NOTE — Progress Notes (Signed)
Report given to PACU, vss 

## 2019-02-28 NOTE — Patient Instructions (Signed)
YOU HAD AN ENDOSCOPIC PROCEDURE TODAY AT THE [facility]:   Refer to the procedure report that was given to you for any specific questions about what was found during the examination.  If the procedure report does not answer your questions, please call your gastroenterologist to clarify.  If you requested that your care partner not be given the details of your procedure findings, then the procedure report has been included in a sealed envelope for you to review at your convenience later.  YOU SHOULD EXPECT: Some feelings of bloating in the abdomen. Passage of more gas than usual.  Walking can help get rid of the air that was put into your GI tract during the procedure and reduce the bloating. If you had a lower endoscopy (such as a colonoscopy or flexible sigmoidoscopy) you may notice spotting of blood in your stool or on the toilet paper. If you underwent a bowel prep for your procedure, you may not have a normal bowel movement for a few days.  Please Note:  You might notice some irritation and congestion in your nose or some drainage.  This is from the oxygen used during your procedure.  There is no need for concern and it should clear up in a day or so.  SYMPTOMS TO REPORT IMMEDIATELY:   Following upper endoscopy (EGD)  Vomiting of blood or coffee ground material  New chest pain or pain under the shoulder blades  Painful or persistently difficult swallowing  New shortness of breath  Fever of 100F or higher  Black, tarry-looking stools  For urgent or emergent issues, a gastroenterologist can be reached at any hour by calling [phone].   DIET:  We do recommend a small meal at first, but then you may proceed to your regular diet.  Drink plenty of fluids but you should avoid alcoholic beverages for 24 hours.  ACTIVITY:  You should plan to take it easy for the rest of today and you should NOT DRIVE or use heavy machinery until tomorrow (because of the sedation medicines used  during the test).    FOLLOW UP: Our staff will call the number listed on your records 48-72 hours following your procedure to check on you and address any questions or concerns that you may have regarding the information given to you following your procedure. If we do not reach you, we will leave a message.  We will attempt to reach you two times.  During this call, we will ask if you have developed any symptoms of COVID 19. If you develop any symptoms (ie: fever, flu-like symptoms, shortness of breath, cough etc.) before then, please call [phone].  If you test positive for Covid 19 in the 2 weeks post procedure, please call and report this information to us.    If any biopsies were taken you will be contacted by phone or by letter within the next 1-3 weeks.  Please call us at [phone] if you have not heard about the biopsies in 3 weeks.    SIGNATURES/CONFIDENTIALITY: You and/or your care partner have signed paperwork which will be entered into your electronic medical record.  These signatures attest to the fact that that the information above on your After Visit Summary has been reviewed and is understood.  Full responsibility of the confidentiality of this discharge information lies with you and/or your care-partner. 

## 2019-02-28 NOTE — Progress Notes (Signed)
Called to room to assist during endoscopic procedure.  Patient ID and intended procedure confirmed with present staff. Received instructions for my participation in the procedure from the performing physician.  

## 2019-02-28 NOTE — Op Note (Addendum)
[facility] Patient Name: Brenda Ware Procedure Date: 02/28/2019 1:04 PM MRN: [id number] Endoscopist: Tressia Danas MD, MD Age: 53 Referring MD:  Date of Birth: [birth year] Gender: Female Account #: [id number] Procedure:                Upper GI endoscopy Indications:              Esophageal reflux symptoms that persist despite                            appropriate therapy                           Reflux despite medical therapy                           - mild reflux noted on UGI 08/02/16                           - symptoms persist despite pantoprazole 40 mg daily                           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                           Mild esophageal dysmotility noted on UGI 08/02/16                           Tiny sliding hiatal hernia on UGI 08/02/16                           BMI 36.61                           No prior colon cancer screening Medicines:                See the Anesthesia note for documentation of the                            administered medications Procedure:                Pre-Anesthesia Assessment:                           - Prior to the procedure, a History and Physical                            was performed, and patient medications and                            allergies were reviewed. The patient's tolerance of                            previous anesthesia was also reviewed. The risks                            and benefits of the procedure and the sedation  options and risks were discussed with the patient.                            All questions were answered, and informed consent                            was obtained. Prior Anticoagulants: The patient has                            taken no previous anticoagulant or antiplatelet                            agents. ASA Grade Assessment: II - A patient with                            mild systemic disease. After reviewing the risks                             and benefits, the patient was deemed in                            satisfactory condition to undergo the procedure.                           After obtaining informed consent, the endoscope was                            passed under direct vision. Throughout the                            procedure, the patient's blood pressure, pulse, and                            oxygen saturations were monitored continuously. The                            Model GIF-[id number]) scope was introduced                            through the mouth, and advanced to the second part                            of duodenum. The upper GI endoscopy was                            accomplished without difficulty. The patient                            tolerated the procedure well. Scope In: Scope Out: Findings:                 Three staples were found in the gastric body. Mild  ulceration was noted adjacent to the most distal                            staple Biopsies were taken with a cold forceps for                            histology.                           The examined duodenum was normal.                           LA Grade A (one or more mucosal breaks less than 5                            mm, not extending between tops of 2 mucosal folds)                            esophagitis with no bleeding was found. Biopsies                            were taken with a cold forceps for histology from                            the distal esophagus as well as from the mid and                            proximal esophagus.                           The cardia and gastric fundus were normal on                            retroflexion except for a small hiatal hernia.                           The exam was otherwise without abnormality. Complications:            No immediate complications. Estimated blood loss:                            Minimal. Estimated Blood Loss:      Estimated blood loss was minimal. Impression:               - Normal esophagus. Biopsied.                           - Small hiatal hernia.                           - Three staples were found in the stomach.                           - Normal examined duodenum.                           -  The examination was otherwise normal. Recommendation:           - Patient has a contact number available for                            emergencies. The signs and symptoms of potential                            delayed complications were discussed with the                            patient. Return to normal activities tomorrow.                            Written discharge instructions were provided to the                            patient.                           - Resume regular diet today.                           - Continue present medications. I did not change                            your medications today.                           - Await pathology results.                           - Repeat upper endoscopy is not recommended based                            on these results.                           - Return to GI clinic (could be virtual encounter)                            to review these results and ongoing symptoms.                           - Screening colonoscopy recommended. Tressia Danas MD, MD 02/28/2019 1:33:59 PM This report has been signed electronically.

## 2019-03-04 ENCOUNTER — Telehealth: Payer: Self-pay

## 2019-03-04 NOTE — Telephone Encounter (Signed)
  Follow up Call-  Call back number 02/28/2019  Post procedure Call Back phone  # [phone]  Permission to leave phone message Yes  Some recent data might be hidden     Patient questions:  Do you have a fever, pain , or abdominal swelling? No. Pain Score  0 *  Have you tolerated food without any problems? Yes.    Have you been able to return to your normal activities? Yes.    Do you have any questions about your discharge instructions: Diet   No. Medications  No. Follow up visit  No.  Do you have questions or concerns about your Care? No.  Actions: * If pain score is 4 or above: No action needed, pain <4.  1. Have you developed a fever since your procedure? No  2.   Have you had an respiratory symptoms (SOB or cough) since your procedure? no  3.   Have you tested positive for COVID 19 since your procedure no  4.   Have you had any family members/close contacts diagnosed with the COVID 19 since your procedure?  no   If yes to any of these questions please route to Joylene John, RN and Alphonsa Gin, Therapist, sports.

## 2019-03-07 ENCOUNTER — Encounter: Payer: Self-pay | Admitting: Gastroenterology

## 2019-04-10 ENCOUNTER — Other Ambulatory Visit: Payer: Self-pay | Admitting: Orthopedic Surgery

## 2019-04-10 ENCOUNTER — Other Ambulatory Visit: Payer: Self-pay | Admitting: Family Medicine

## 2019-04-10 DIAGNOSIS — Z1231 Encounter for screening mammogram for malignant neoplasm of breast: Secondary | ICD-10-CM

## 2019-05-29 ENCOUNTER — Ambulatory Visit
Admission: RE | Admit: 2019-05-29 | Discharge: 2019-05-29 | Disposition: A | Discharge status: Home / Self Care | Payer: BC Managed Care – PPO | Source: Ambulatory Visit | Attending: *Deleted | Admitting: *Deleted

## 2019-05-29 ENCOUNTER — Other Ambulatory Visit: Payer: Self-pay

## 2019-05-29 DIAGNOSIS — Z1231 Encounter for screening mammogram for malignant neoplasm of breast: Secondary | ICD-10-CM

## 2019-07-08 ENCOUNTER — Other Ambulatory Visit: Payer: Self-pay | Admitting: Gastroenterology

## 2019-08-16 ENCOUNTER — Ambulatory Visit (INDEPENDENT_AMBULATORY_CARE_PROVIDER_SITE_OTHER): Payer: BC Managed Care – PPO

## 2019-08-16 ENCOUNTER — Other Ambulatory Visit: Payer: Self-pay

## 2019-08-16 ENCOUNTER — Encounter: Payer: Self-pay | Admitting: Sports Medicine

## 2019-08-16 ENCOUNTER — Other Ambulatory Visit: Payer: Self-pay | Admitting: Sports Medicine

## 2019-08-16 ENCOUNTER — Ambulatory Visit: Payer: BC Managed Care – PPO | Admitting: Sports Medicine

## 2019-08-16 DIAGNOSIS — M2042 Other hammer toe(s) (acquired), left foot: Secondary | ICD-10-CM | POA: Diagnosis not present

## 2019-08-16 DIAGNOSIS — M79675 Pain in left toe(s): Secondary | ICD-10-CM | POA: Diagnosis not present

## 2019-08-16 DIAGNOSIS — M2041 Other hammer toe(s) (acquired), right foot: Secondary | ICD-10-CM

## 2019-08-16 NOTE — Progress Notes (Signed)
Subjective: NANCY ARVIN is a 53 y.o. female patient who presents to office for evaluation of Left foot pain. Patient complains of progressive pain especially over the last year in the Left foot at the 2 and 5 toes. Ranks pain 6/10 and is now interferring with daily activities. Patient has tried toe splints and cushions with no relief in symptoms.  Patient reports that she was supposed to have surgery with Dr. Melony Overly but did not because the surgical center is closed at this time and so was referred to our practice for surgical consult.  Patient denies any other pedal complaints.    Review of Systems  All other systems reviewed and are negative.    Patient Active Problem List   Diagnosis Date Noted  . Trigger finger, right ring finger 09/21/2017  . Obesity (BMI 30-39.9) 11/07/2013  . 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 Urinary incontinence, mixed 02/02/2007  . Hyperlipidemia 11/23/2006  . Tobacco use disorder 11/23/2006  . Primary hypertension 11/23/2006  . Hydradenitis 11/23/2006    Current Outpatient Medications on File Prior to Visit  Medication Sig Dispense Refill  . dexlansoprazole (DEXILANT) 60 MG capsule Take 60 mg by mouth daily.    . Biotin (BIOTIN 5000) 5 MG CAPS Take by mouth.    . fexofenadine (ALLEGRA) 30 MG tablet Take 30 mg by mouth 2 (two) times daily.    . Multiple Vitamin (MULTIVITAMIN) tablet Take 1 tablet by mouth daily.     No current facility-administered medications on file prior to visit.     Allergies  Allergen Reactions  . Avelox [Moxifloxacin Hcl In Nacl] Other (See Comments)    Causes throat and tongue to swell  . Doxycycline Other (See Comments)    GI UPSET  . Vicodin [Hydrocodone-Acetaminophen] Other (See Comments)    GI UPSET   Family History  Problem Relation Age of Onset  . Colon cancer Neg Hx   . Esophageal cancer Neg Hx   . Stomach cancer Neg Hx   . Rectal cancer Neg Hx   . Breast cancer Neg Hx      Social History   Socioeconomic History  . Marital status: Married    Spouse name: Not on file  . Number of children: Not on file  . Years of education: Not on file  . Highest education level: Not on file  Occupational History  . Not on file  Social Needs  . Financial resource strain: Not on file  . Food insecurity    Worry: Not on file    Inability: Not on file  . Transportation needs    Medical: Not on file    Non-medical: Not on file  Tobacco Use  . Smoking status: Former Smoker    Packs/day: 1.00    Years: 10.00    Pack years: 10.00    Types: Cigarettes    Quit date: 12/04/2012    Years since quitting: 6.7  . Smokeless tobacco: Never Used  Substance and Sexual Activity  . Alcohol use: No  . Drug use: No  . Sexual activity: Yes  Lifestyle  . Physical activity    Days per week: Not on file    Minutes per session: Not on file  . Stress: Not on file  Relationships  . Social Herbalist on phone: Not on file    Gets together: Not on file    Attends religious service: Not on file    Active member  of club or organization: Not on file    Attends meetings of clubs or organizations: Not on file    Relationship status: Not on file  . Intimate partner violence    Fear of current or ex partner: Not on file    Emotionally abused: Not on file    Physically abused: Not on file    Forced sexual activity: Not on file  Other Topics Concern  . Not on file  Social History Narrative  . Not on file   Past Surgical History:  Procedure Laterality Date  . BLADDER SUSPENSION  04/17/2012   Procedure: [facility] PROCEDURE;  Surgeon: Martina Sinner, MD;  Location: [facility];  Service: Urology;  Laterality: N/A;  . CHOLECYSTECTOMY  1991  . HIATAL HERNIA REPAIR N/A 06/24/2013   Procedure: LAPAROSCOPIC REPAIR OF HIATAL HERNIA;  Surgeon: Atilano Ina, MD;  Location: [facility];  Service: General;  Laterality: N/A;  . LAPAROSCOPIC GASTRIC SLEEVE RESECTION N/A  06/24/2013   Procedure: LAPAROSCOPIC GASTRIC SLEEVE RESECTION;  Surgeon: Atilano Ina, MD;  Location: [facility];  Service: General;  Laterality: N/A;  . TUBAL LIGATION  1991  . UPPER GI ENDOSCOPY N/A 06/24/2013   Procedure: UPPER GI ENDOSCOPY;  Surgeon: Atilano Ina, MD;  Location: [facility];  Service: General;  Laterality: N/A;    Objective:  General: Alert and oriented x3 in no acute distress  Dermatology: Small hyperkeratotic lesion overlying 2 and 5 PIPJ dorsally on left. Surgical scar intact on right. No open lesions bilateral lower extremities, no webspace macerations, no ecchymosis bilateral, all nails x 10 are well manicured.  Vascular: Dorsalis Pedis and Posterior Tibial pedal pulses 2/4, Capillary Fill Time 3 seconds,(+) pedal hair growth bilateral, no edema bilateral lower extremities, Temperature gradient within normal limits.  Neurology: Michaell Cowing sensation intact via light touch bilateral.   Musculoskeletal: Semi-flexible hammertoes 2 and 5 with Mild tenderness with palpation at PIPJ Left. Ankle, Subtalar, Midtarsal, and MTPJ joint range of motion is within normal limits, there is no 1st ray hypermobility noted bilateral, No bunion deformity noted bilateral. No pain with calf compression bilateral.  Strength within normal limits in all groups bilateral.   Gait: Unassisted, Non-antalgic.  Xrays  Left Foot    Impression: Mild hammertoe deformities.  No other acute findings.       Assessment and Plan: Problem List Items Addressed This Visit    None    Visit Diagnoses    Hammertoe of left foot    -  Primary   Pain of toe of left foot           -Complete examination performed -Xrays reviewed -Discussed treatment options for hammertoes -Patient opt for surgical management. Consent obtained for left second and fifth hammertoe repair with possible K wire second. Pre and Post op course explained. Risks, benefits, alternatives explained. No guarantees given or implied. Surgical booking  slip submitted and provided patient with Surgical packet and info for [facility] -To dispense postop shoe or cam boot at surgical center -Patient to return to office after surgery or sooner if condition worsens.  Asencion Islam, DPM

## 2019-08-16 NOTE — Patient Instructions (Signed)
Pre-Operative Instructions  Congratulations, you have decided to take an important step towards improving your quality of life.  You can be assured that the doctors and staff at [facility] will be with you every step of the way.  Here are some important things you should know:  1. Plan to be at the surgery center/hospital at least 1 (one) hour prior to your scheduled time, unless otherwise directed by the surgical center/hospital staff.  You must have a responsible adult accompany you, remain during the surgery and drive you home.  Make sure you have directions to the surgical center/hospital to ensure you arrive on time. 2. If you are having surgery at [facility] or [facility], you will need a copy of your medical history and physical form from your family physician within one month prior to the date of surgery. We will give you a form for your primary physician to complete.  3. We make every effort to accommodate the date you request for surgery.  However, there are times where surgery dates or times have to be moved.  We will contact you as soon as possible if a change in schedule is required.   4. No aspirin/ibuprofen for one week before surgery.  If you are on aspirin, any non-steroidal anti-inflammatory medications (Mobic, Aleve, Ibuprofen) should not be taken seven (7) days prior to your surgery.  You make take Tylenol for pain prior to surgery.  5. Medications - If you are taking daily heart and blood pressure medications, seizure, reflux, allergy, asthma, anxiety, pain or diabetes medications, make sure you notify the surgery center/hospital before the day of surgery so they can tell you which medications you should take or avoid the day of surgery. 6. No food or drink after midnight the night before surgery unless directed otherwise by surgical center/hospital staff. 7. No alcoholic beverages 24-hours prior to surgery.  No smoking 24-hours prior or 24-hours after  surgery. 8. Wear loose pants or shorts. They should be loose enough to fit over bandages, boots, and casts. 9. Don't wear slip-on shoes. Sneakers are preferred. 10. Bring your boot with you to the surgery center/hospital.  Also bring crutches or a walker if your physician has prescribed it for you.  If you do not have this equipment, it will be provided for you after surgery. 11. If you have not been contacted by the surgery center/hospital by the day before your surgery, call to confirm the date and time of your surgery. 12. Leave-time from work may vary depending on the type of surgery you have.  Appropriate arrangements should be made prior to surgery with your employer. 13. Prescriptions will be provided immediately following surgery by your doctor.  Fill these as soon as possible after surgery and take the medication as directed. Pain medications will not be refilled on weekends and must be approved by the doctor. 14. Remove nail polish on the operative foot and avoid getting pedicures prior to surgery. 15. Wash the night before surgery.  The night before surgery wash the foot and leg well with water and the antibacterial soap provided. Be sure to pay special attention to beneath the toenails and in between the toes.  Wash for at least three (3) minutes. Rinse thoroughly with water and dry well with a towel.  Perform this wash unless told not to do so by your physician.  Enclosed: 1 Ice pack (please put in freezer the night before surgery)   1 Hibiclens skin cleaner     Pre-op instructions  If you have any questions regarding the instructions, please do not hesitate to call our office.  [place]: [address], [place], [place] [address] -- [phone]  [place]: [address] [place], [place] [address] -- [phone]  [place]: [address]  [place],  [address] -- [phone]   Website: [email or web address] 

## 2019-08-20 ENCOUNTER — Telehealth: Payer: Self-pay | Admitting: *Deleted

## 2019-08-20 NOTE — Telephone Encounter (Signed)
"  I seen Dr. Cannon Kettle today.  I need to schedule a surgery date.  You can give me a call."  I am returning your call.  How can I help you?  "I would like to schedule my surgery with Dr. Cannon Kettle."  Dr. Cannon Kettle does surgeries on Mondays.  Do you have a date that you like?  "I just need to have it done by January."  Dr. Cannon Kettle can do it any Monday in December except for September 23, 2019.  "Let's schedule me for the first Monday in December.  I believe that is December 7."  I'll get it scheduled.  Someone from the surgical center will give you a call the Friday before your surgery date and will give you your arrival time.  You can go online and register via the surgical center's online portal, the instructions are in the brochure that we gave you.

## 2019-08-20 NOTE — Telephone Encounter (Signed)
DOS 09/02/2019 HAMMER TOE REPAIR 2,5 LEFT FOOT - [id number]  BCBS: Eligibility Date - 09/26/2018 - 09/25/9998   In-Network    Max Per Benefit Period Year-to-Date Remaining  CoInsurance  20%   Deductible  $1250.00 $0.00  Out-Of-Pocket  $4890.00 $1427.50   AMBULATORY SURGERY  In Network  [employer] Coinsurance  Not Applicable  47% per [place]

## 2019-09-02 ENCOUNTER — Encounter: Payer: Self-pay | Admitting: Sports Medicine

## 2019-09-02 ENCOUNTER — Other Ambulatory Visit: Payer: Self-pay | Admitting: Sports Medicine

## 2019-09-02 DIAGNOSIS — M2042 Other hammer toe(s) (acquired), left foot: Secondary | ICD-10-CM | POA: Diagnosis not present

## 2019-09-02 DIAGNOSIS — Z9889 Other specified postprocedural states: Secondary | ICD-10-CM

## 2019-09-02 MED ORDER — DOCUSATE SODIUM 100 MG PO CAPS: 100.0000 mg | ORAL_CAPSULE | Freq: Two times a day (BID) | ORAL | 0 refills | Status: DC

## 2019-09-02 MED ORDER — PROMETHAZINE HCL 25 MG PO TABS: 25.0000 mg | ORAL_TABLET | Freq: Three times a day (TID) | ORAL | 0 refills | Status: DC | PRN

## 2019-09-02 MED ORDER — OXYCODONE-ACETAMINOPHEN 10-325 MG PO TABS: 1.0000 | ORAL_TABLET | Freq: Four times a day (QID) | ORAL | 0 refills | Status: AC | PRN

## 2019-09-02 NOTE — Progress Notes (Signed)
Post op meds entered 

## 2019-09-03 ENCOUNTER — Telehealth: Payer: Self-pay | Admitting: Sports Medicine

## 2019-09-03 NOTE — Telephone Encounter (Signed)
Post op check phone call made to patient. Patient reports that she is doing ok. Has some burning and throbbing pain at baby toe. I advised patient to continue with Pain medication and that she can check the dressing by loosening the ACE wrap some to make sure its not too tight. I advised patient to call office if she has any other ?s. -Dr. Cannon Kettle

## 2019-09-11 ENCOUNTER — Encounter: Payer: BC Managed Care – PPO | Admitting: Sports Medicine

## 2019-09-13 ENCOUNTER — Encounter: Payer: Self-pay | Admitting: Sports Medicine

## 2019-09-13 ENCOUNTER — Other Ambulatory Visit: Payer: Self-pay | Admitting: Sports Medicine

## 2019-09-13 ENCOUNTER — Ambulatory Visit (INDEPENDENT_AMBULATORY_CARE_PROVIDER_SITE_OTHER): Payer: BC Managed Care – PPO

## 2019-09-13 ENCOUNTER — Ambulatory Visit (INDEPENDENT_AMBULATORY_CARE_PROVIDER_SITE_OTHER): Payer: Self-pay | Admitting: Sports Medicine

## 2019-09-13 ENCOUNTER — Other Ambulatory Visit: Payer: Self-pay

## 2019-09-13 DIAGNOSIS — Z9889 Other specified postprocedural states: Secondary | ICD-10-CM

## 2019-09-13 DIAGNOSIS — M2042 Other hammer toe(s) (acquired), left foot: Secondary | ICD-10-CM | POA: Diagnosis not present

## 2019-09-13 DIAGNOSIS — M79675 Pain in left toe(s): Secondary | ICD-10-CM

## 2019-09-13 NOTE — Progress Notes (Signed)
Subjective: Brenda Ware is a 53 y.o. female patient seen today in office for POV #1 (DOS 09/02/2019), S/P left second and fifth hammertoe repair. Patient admits pain at surgical site at fifth toe states that there is a burning sensation off and on to the area, denies calf pain, denies headache, chest pain, shortness of breath, nausea, vomiting, fever, or chills.No other issues noted.   Patient Active Problem List   Diagnosis Date Noted  . Trigger finger, right ring finger 09/21/2017  . Obesity (BMI 30-39.9) 11/07/2013  . 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 Urinary incontinence, mixed 02/02/2007  . Hyperlipidemia 11/23/2006  . Tobacco use disorder 11/23/2006  . Primary hypertension 11/23/2006  . Hydradenitis 11/23/2006    Current Outpatient Medications on File Prior to Visit  Medication Sig Dispense Refill  . Biotin (BIOTIN 5000) 5 MG CAPS Take by mouth.    . dexlansoprazole (DEXILANT) 60 MG capsule Take 60 mg by mouth daily.    Marland Kitchen docusate sodium (COLACE) 100 MG capsule Take 1 capsule (100 mg total) by mouth 2 (two) times daily. 10 capsule 0  . fexofenadine (ALLEGRA) 30 MG tablet Take 30 mg by mouth 2 (two) times daily.    . Multiple Vitamin (MULTIVITAMIN) tablet Take 1 tablet by mouth daily.    . promethazine (PHENERGAN) 25 MG tablet Take 1 tablet (25 mg total) by mouth every 8 (eight) hours as needed for nausea or vomiting. 20 tablet 0   No current facility-administered medications on file prior to visit.    Allergies  Allergen Reactions  . Avelox [Moxifloxacin Hcl In Nacl] Other (See Comments)    Causes throat and tongue to swell  . Doxycycline Other (See Comments)    GI UPSET  . Vicodin [Hydrocodone-Acetaminophen] Other (See Comments)    GI UPSET    Objective: There were no vitals filed for this visit.  General: No acute distress, AAOx3  Left foot: Sutures intact with no gapping or dehiscence at surgical site, mild swelling to  left forefoot foot, no erythema, no warmth, no drainage, no signs of infection noted, Capillary fill time <3 seconds in all digits, gross sensation present via light touch to left foot. No pain or crepitation with range of motion left foot.  No pain with calf compression.   Post Op Xray, left foot: Acceptable alignment and position.  Arthroplasty site healing. Soft tissue swelling within normal limits for post op status.   Assessment and Plan:  Problem List Items Addressed This Visit    None    Visit Diagnoses    Post-operative state    -  Primary   Hammertoe of left foot       Pain of toe of left foot           -Patient seen and evaluated -X-rays consistent with postoperative status -Applied dry sterile dressing to surgical site left foot secured with ACE wrap and stockinet  -Advised patient to make sure to keep dressings clean, dry, and intact to left/right surgical site, adjusting the ACE as needed  -Advised patient to continue with post-op shoe on left foot -Continue with PRN meds -Advised patient to limit activity to necessity  -Advised patient to ice and elevate as necessary  -Will plan for possible suture removal at next office visit. In the meantime, patient to call office if any issues or problems arise.   Landis Martins, DPM

## 2019-09-18 ENCOUNTER — Encounter: Payer: Self-pay | Admitting: Sports Medicine

## 2019-09-18 ENCOUNTER — Other Ambulatory Visit: Payer: Self-pay | Admitting: Sports Medicine

## 2019-09-18 ENCOUNTER — Other Ambulatory Visit: Payer: Self-pay

## 2019-09-18 ENCOUNTER — Ambulatory Visit (INDEPENDENT_AMBULATORY_CARE_PROVIDER_SITE_OTHER): Payer: BC Managed Care – PPO | Admitting: Sports Medicine

## 2019-09-18 DIAGNOSIS — Z9889 Other specified postprocedural states: Secondary | ICD-10-CM

## 2019-09-18 DIAGNOSIS — M2042 Other hammer toe(s) (acquired), left foot: Secondary | ICD-10-CM

## 2019-09-18 DIAGNOSIS — M79675 Pain in left toe(s): Secondary | ICD-10-CM

## 2019-09-18 NOTE — Progress Notes (Signed)
Subjective: FONNIE CROOKSHANKS is a 53 y.o. female patient seen today in office for POV #2 (DOS 09/02/2019), S/P left second and fifth hammertoe repair. Patient admits pain at surgical site at fifth toe like before burning, denies calf pain, denies headache, chest pain, shortness of breath, nausea, vomiting, fever, or chills. No other issues noted.   Patient Active Problem List   Diagnosis Date Noted  . Trigger finger, right ring finger 09/21/2017  . Obesity (BMI 30-39.9) 11/07/2013  . 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 Urinary incontinence, mixed 02/02/2007  . Hyperlipidemia 11/23/2006  . Tobacco use disorder 11/23/2006  . Primary hypertension 11/23/2006  . Hydradenitis 11/23/2006    Current Outpatient Medications on File Prior to Visit  Medication Sig Dispense Refill  . Biotin (BIOTIN 5000) 5 MG CAPS Take by mouth.    . dexlansoprazole (DEXILANT) 60 MG capsule Take 60 mg by mouth daily.    Marland Kitchen docusate sodium (COLACE) 100 MG capsule Take 1 capsule (100 mg total) by mouth 2 (two) times daily. 10 capsule 0  . fexofenadine (ALLEGRA) 30 MG tablet Take 30 mg by mouth 2 (two) times daily.    . Multiple Vitamin (MULTIVITAMIN) tablet Take 1 tablet by mouth daily.    . promethazine (PHENERGAN) 25 MG tablet Take 1 tablet (25 mg total) by mouth every 8 (eight) hours as needed for nausea or vomiting. 20 tablet 0   No current facility-administered medications on file prior to visit.    Allergies  Allergen Reactions  . Avelox [Moxifloxacin Hcl In Nacl] Other (See Comments)    Causes throat and tongue to swell  . Doxycycline Other (See Comments)    GI UPSET  . Vicodin [Hydrocodone-Acetaminophen] Other (See Comments)    GI UPSET    Objective: There were no vitals filed for this visit.  General: No acute distress, AAOx3  Left foot: Sutures intact with no gapping or dehiscence at surgical site, mild swelling to left forefoot foot, no erythema, no warmth, no  drainage, no signs of infection noted, Capillary fill time <3 seconds in all digits, gross sensation present via light touch to left foot. No pain or crepitation with range of motion left foot.  No pain with calf compression.    Assessment and Plan:  Problem List Items Addressed This Visit    None    Visit Diagnoses    Post-operative state    -  Primary   Hammertoe of left foot       Pain of toe of left foot           -Patient seen and evaluated -Sutures removed -Applied dry sterile dressing to surgical site left foot secured with ACE wrap and stockinet  -Advised patient may shower tomorrow  -Advised patient to continue with post-op shoe on left foot and may slowly transition to a tennis shoe as she can tolerate with the assistance of physical therapy.  Prescription faxed to resolve physical therapy [place] for patient to do therapy for her left foot as well she is currently undergoing therapy for her right foot. -Continue with PRN meds -Advised patient to limit activity to necessity  -Advised patient to ice and elevate as necessary  -Patient to remain out of work until next office visit we will reassess her ability to return when she comes to office next visit -Will plan for x-rays in discussing return to work at next office visit. In the meantime, patient to call office if any issues  or problems arise.   Asencion Islam, DPM

## 2019-10-02 ENCOUNTER — Other Ambulatory Visit: Payer: Self-pay

## 2019-10-02 ENCOUNTER — Ambulatory Visit (INDEPENDENT_AMBULATORY_CARE_PROVIDER_SITE_OTHER): Payer: BC Managed Care – PPO | Admitting: Sports Medicine

## 2019-10-02 ENCOUNTER — Other Ambulatory Visit: Payer: Self-pay | Admitting: Sports Medicine

## 2019-10-02 ENCOUNTER — Encounter: Payer: Self-pay | Admitting: Sports Medicine

## 2019-10-02 ENCOUNTER — Ambulatory Visit (INDEPENDENT_AMBULATORY_CARE_PROVIDER_SITE_OTHER): Payer: BC Managed Care – PPO

## 2019-10-02 DIAGNOSIS — M2042 Other hammer toe(s) (acquired), left foot: Secondary | ICD-10-CM

## 2019-10-02 DIAGNOSIS — Z9889 Other specified postprocedural states: Secondary | ICD-10-CM

## 2019-10-02 DIAGNOSIS — M79675 Pain in left toe(s): Secondary | ICD-10-CM

## 2019-10-02 NOTE — Progress Notes (Signed)
Subjective: Brenda Ware is a 54 y.o. female patient seen today in office for POV #3 (DOS 09/02/2019), S/P left second and fifth hammertoe repair. Patient admits pain at surgical sites worse after PT someday 5/10, denies calf pain, denies headache, chest pain, shortness of breath, nausea, vomiting, fever, or chills. No other issues noted.   Patient Active Problem List   Diagnosis Date Noted  . Trigger finger, right ring finger 09/21/2017  . Obesity (BMI 30-39.9) 11/07/2013  . 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 Urinary incontinence, mixed 02/02/2007  . Hyperlipidemia 11/23/2006  . Tobacco use disorder 11/23/2006  . Primary hypertension 11/23/2006  . Hydradenitis 11/23/2006    Current Outpatient Medications on File Prior to Visit  Medication Sig Dispense Refill  . Biotin (BIOTIN 5000) 5 MG CAPS Take by mouth.    . dexlansoprazole (DEXILANT) 60 MG capsule Take 60 mg by mouth daily.    Marland Kitchen docusate sodium (COLACE) 100 MG capsule Take 1 capsule (100 mg total) by mouth 2 (two) times daily. 10 capsule 0  . fexofenadine (ALLEGRA) 30 MG tablet Take 30 mg by mouth 2 (two) times daily.    . Multiple Vitamin (MULTIVITAMIN) tablet Take 1 tablet by mouth daily.    . promethazine (PHENERGAN) 25 MG tablet Take 1 tablet (25 mg total) by mouth every 8 (eight) hours as needed for nausea or vomiting. 20 tablet 0   No current facility-administered medications on file prior to visit.    Allergies  Allergen Reactions  . Avelox [Moxifloxacin Hcl In Nacl] Other (See Comments)    Causes throat and tongue to swell  . Doxycycline Other (See Comments)    GI UPSET  . Vicodin [Hydrocodone-Acetaminophen] Other (See Comments)    GI UPSET    Objective: There were no vitals filed for this visit.  General: No acute distress, AAOx3  Left foot: Incision healed at surgical sites, mild swelling to left forefoot foot, no erythema, no warmth, no drainage, no signs of infection  noted, Capillary fill time <3 seconds in all digits, gross sensation present via light touch to left foot. No pain or crepitation with range of motion left foot.  No pain with calf compression.    Assessment and Plan:  Problem List Items Addressed This Visit    None    Visit Diagnoses    Post-operative state    -  Primary   Hammertoe of left foot       Pain of toe of left foot           -Patient seen and evaluated --Xrays reviewed consisting with post op status -Continue with PT -Continue with normal shoe as tolerated -Advised patient to limit activity to necessity  -Advised patient to ice and elevate as necessary  -Patient to remain out of work until next office  -Will plan for post op check at next office visit. In the meantime, patient to call office if any issues or problems arise.   Asencion Islam, DPM

## 2019-10-10 ENCOUNTER — Other Ambulatory Visit: Payer: Self-pay | Admitting: Sports Medicine

## 2019-10-10 DIAGNOSIS — Z9889 Other specified postprocedural states: Secondary | ICD-10-CM

## 2019-10-10 DIAGNOSIS — M2042 Other hammer toe(s) (acquired), left foot: Secondary | ICD-10-CM

## 2019-10-30 ENCOUNTER — Ambulatory Visit (INDEPENDENT_AMBULATORY_CARE_PROVIDER_SITE_OTHER): Payer: BC Managed Care – PPO | Admitting: Sports Medicine

## 2019-10-30 ENCOUNTER — Encounter: Payer: Self-pay | Admitting: Sports Medicine

## 2019-10-30 ENCOUNTER — Other Ambulatory Visit: Payer: Self-pay

## 2019-10-30 DIAGNOSIS — Z9889 Other specified postprocedural states: Secondary | ICD-10-CM

## 2019-10-30 DIAGNOSIS — M2042 Other hammer toe(s) (acquired), left foot: Secondary | ICD-10-CM

## 2019-10-30 DIAGNOSIS — M79675 Pain in left toe(s): Secondary | ICD-10-CM

## 2019-10-30 NOTE — Progress Notes (Signed)
Subjective: Brenda Ware is a 54 y.o. female patient seen today in office for POV #4 (DOS 09/02/2019), S/P left second and fifth hammertoe repair. Patient admits pain at surgical sites of burning at 5th toe and pain that is getting better with PT, denies calf pain, denies headache, chest pain, shortness of breath, nausea, vomiting, fever, or chills. No other issues noted.   Patient Active Problem List   Diagnosis Date Noted  . Trigger finger, right ring finger 09/21/2017  . Obesity (BMI 30-39.9) 11/07/2013  . 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 Urinary incontinence, mixed 02/02/2007  . Hyperlipidemia 11/23/2006  . Tobacco use disorder 11/23/2006  . Primary hypertension 11/23/2006  . Hydradenitis 11/23/2006    Current Outpatient Medications on File Prior to Visit  Medication Sig Dispense Refill  . Biotin (BIOTIN 5000) 5 MG CAPS Take by mouth.    . dexlansoprazole (DEXILANT) 60 MG capsule Take 60 mg by mouth daily.    Marland Kitchen docusate sodium (COLACE) 100 MG capsule Take 1 capsule (100 mg total) by mouth 2 (two) times daily. 10 capsule 0  . fexofenadine (ALLEGRA) 30 MG tablet Take 30 mg by mouth 2 (two) times daily.    . Multiple Vitamin (MULTIVITAMIN) tablet Take 1 tablet by mouth daily.    . promethazine (PHENERGAN) 25 MG tablet Take 1 tablet (25 mg total) by mouth every 8 (eight) hours as needed for nausea or vomiting. 20 tablet 0   No current facility-administered medications on file prior to visit.    Allergies  Allergen Reactions  . Avelox [Moxifloxacin Hcl In Nacl] Other (See Comments)    Causes throat and tongue to swell  . Doxycycline Other (See Comments)    GI UPSET  . Vicodin [Hydrocodone-Acetaminophen] Other (See Comments)    GI UPSET    Objective: There were no vitals filed for this visit.  General: No acute distress, AAOx3  Left foot: Incisions well healed at surgical sites, mild swelling to left forefoot foot, no erythema, no  warmth, no drainage, no signs of infection noted, Capillary fill time <3 seconds in all digits, gross sensation present via light touch to left foot. No pain or crepitation with range of motion left foot.  No pain with calf compression.    Assessment and Plan:  Problem List Items Addressed This Visit    None    Visit Diagnoses    Post-operative state    -  Primary   Hammertoe of left foot       Pain of toe of left foot           -Patient seen and evaluated -Continue with PT -Continue with normal shoe as tolerated -Advised patient to limit activity to necessity  -Advised patient to ice and elevate as necessary  -Patient to remain out of work until 11/18/19 -Continue with good supportive shoe and may use coban compression for edema control -Will plan for xrays and final post op check at next office visit. In the meantime, patient to call office if any issues or problems arise.   Asencion Islam, DPM

## 2019-11-05 ENCOUNTER — Encounter: Payer: Self-pay | Admitting: Dietician

## 2019-11-05 ENCOUNTER — Encounter: Payer: BC Managed Care – PPO | Attending: General Surgery | Admitting: Dietician

## 2019-11-05 ENCOUNTER — Other Ambulatory Visit: Payer: Self-pay

## 2019-11-05 DIAGNOSIS — E669 Obesity, unspecified: Secondary | ICD-10-CM | POA: Diagnosis present

## 2019-11-05 NOTE — Progress Notes (Signed)
Nutrition Assessment for Bariatric Surgery Medical Nutrition Therapy  Appt Start Time: 3:15pm    End Time: 3:55pm  Patient was seen on 11/05/2019 for Pre-Operative Nutrition Assessment. Letter of approval faxed to [facility] bariatric surgery program coordinator on 11/05/2019.   Referral stated Supervised Weight Loss (SWL) visits needed: 0  Planned surgery: Sleeve to RYGB  Pt expectation of surgery: to be healthier and rid of acid reflux    NUTRITION ASSESSMENT   Anthropometrics  Start weight at [facility]: 251.9 lbs (date: 11/05/2019) Height: 65 in BMI: 41.9 kg/m2     Lifestyle & Dietary Hx Will eat out pretty often, may have [place] (shrimp, queso, and rice), a kid's meal, seafood (broiled catfish and slaw). Snacks include chips, chocolate, candy. Will drink about 50 ounces of fluid per day.   24-Hr Dietary Recall First Meal: Fairlife protein drink  Snack: chips Second Meal: chicken caesar salad (16 g protein)  Snack: candy  Third Meal: meat + vegetable  Snack: chocolate  Beverages: water w/ Crystal Light   Estimated Energy Needs Calories: 1500-1600 Carbohydrate: 170-180g Protein: 94-100g Fat: 50-53g   NUTRITION DIAGNOSIS  Overweight/obesity ([place]-3.3) related to past poor dietary habits and physical inactivity as evidenced by patient w/ planned Sleeve to RYGB surgery following dietary guidelines for continued weight loss.    NUTRITION INTERVENTION  Nutrition counseling (C-1) and education (E-2) to facilitate bariatric surgery goals.  Pre-Op Goals Reviewed with the Patient . Track food and beverage intake (pen and paper, MyFitness Pal, Baritastic app, etc.) . Make healthy food choices while monitoring portion sizes . Consume 3 meals per day or try to eat every 3-5 hours . Avoid concentrated sugars and fried foods . Keep sugar & fat in the single digits per serving on food labels . Practice CHEWING your food (aim for applesauce consistency) . Practice not drinking  15 minutes before, during, and 30 minutes after each meal and snack . Avoid all carbonated beverages (ex: soda, sparkling beverages)  . Limit caffeinated beverages (ex: coffee, tea, energy drinks) . Avoid all sugar-sweetened beverages (ex: regular soda, sports drinks)  . Avoid alcohol  . Aim for 64-100 ounces of FLUID daily (with at least half of fluid intake being plain water)  . Aim for at least 60-80 grams of PROTEIN daily . Look for a liquid protein source that contains ?15 g protein and ?5 g carbohydrate (ex: shakes, drinks, shots) . Make a list of non-food related activities . Physical activity is an important part of a healthy lifestyle so keep it moving! The goal is to reach 150 minutes of exercise per week, including cardiovascular and weight baring activity.  Handouts Provided Include  . Bariatric Surgery Nutrition Visits . Pre-Op Goals  Learning Style & Readiness for Change Teaching method utilized: Visual & Auditory  Demonstrated degree of understanding via: Teach Back  Barriers to learning/adherence to lifestyle change: hx of not adhering to appropriate dietary changes s/p bariatric surgery     MONITORING & EVALUATION Dietary intake, weekly physical activity, body weight, and pre-op goals reached at next nutrition visit.   Next Steps Patient is to follow up at [facility] for Pre-Op Class (>2 weeks before surgery) for further nutrition education.

## 2019-11-25 ENCOUNTER — Ambulatory Visit: Payer: BC Managed Care – PPO

## 2019-11-29 ENCOUNTER — Encounter: Payer: BC Managed Care – PPO | Admitting: Sports Medicine

## 2019-12-09 ENCOUNTER — Encounter: Payer: BC Managed Care – PPO | Attending: General Surgery | Admitting: Skilled Nursing Facility1

## 2019-12-09 ENCOUNTER — Other Ambulatory Visit: Payer: Self-pay

## 2019-12-09 DIAGNOSIS — E669 Obesity, unspecified: Secondary | ICD-10-CM | POA: Insufficient documentation

## 2019-12-09 NOTE — Progress Notes (Signed)
Pre-Operative Nutrition Class:  Appt start time: 7482   End time:  1830.  Patient was seen on 12/09/2019 for Pre-Operative Bariatric Surgery [facility].   Surgery date:  Surgery type: Sleeve to RYGB Start weight at [facility]: 251.9 Weight today: 259.9  Samples given per MNT protocol. Patient educated on appropriate usage: Bariatric Advantage Multivitamin Lot #[id number] Exp:08/21  Bariatric Advantage Calcium  Lot #[id number] Exp:09/28/2020  Protein Shake Lot #[id number] Exp:02/10/2021  The following the learning objectives were met by the patient during this course:  Identify Pre-Op Dietary Goals and will begin 2 weeks pre-operatively  Identify appropriate sources of fluids and proteins   State protein recommendations and appropriate sources pre and post-operatively  Identify Post-Operative Dietary Goals and will follow for 2 weeks post-operatively  Identify appropriate multivitamin and calcium sources  Describe the need for physical activity post-operatively and will follow MD recommendations  State when to call healthcare provider regarding medication questions or post-operative complications  Handouts given during class include:  Pre-Op Bariatric Surgery Diet Handout  Protein Shake Handout  Post-Op Bariatric Surgery Nutrition Handout  [facility]ELT Program Information Flyer  Support Group Information Flyer  [facility] Bariatric Supplements Price List  Follow-Up Plan: Patient will follow-up at [facility] 2 weeks post operatively for diet advancement per MD.

## 2019-12-18 ENCOUNTER — Ambulatory Visit: Payer: Self-pay | Admitting: General Surgery

## 2019-12-18 NOTE — H&P (Signed)
Brenda Ware Documented: 12/18/2019 11:52 AM Location: [facility] Patient #: [id number] DOB: Aug 17, 1966 Married / Language: Cleophus Molt / Race: White Female  History of Present Illness Randall Hiss M. Bathsheba Durrett MD; 12/18/2019 12:42 PM) The patient is a 54 year old female presenting status-post bariatric surgery. 54 yo female for followup after undergoing LSG with hiatal hernia repair on 06/14/2013. Her preop wt was 278lbs.  I last saw her in sept 2020 Her weight at that time was 227 pounds. She states that she is continuing to have ongoing and worsening issues with heartburn. She states that she gavascon all the time and eats them like candy. She will have an acid sensation in her throat if she lays flat. She cannot eat any solid food after early evening. Sometimes she will wake up in the morning with an acid taste in her throat. She will also wake up in the morning sometimes with sensation of something sitting in her throat. She has not regurgitated or vomited for about 2-3 months. But the acid sensation is pretty much there all the time. She has to sleep upright. She is taking dexilant now. not any real difference b/t dexilant and the protonix. She did undergo an upper endoscopy in June by Dr. Tarri Glenn that showed an LA grade a esophagitis, small hiatal hernia, 2 or 3 expose staples in the gastric body and some gastritis. Esophageal biopsy showed reflux changes. Gastric biopsy was negative for H. pylori. she has been released from her ankle surgeon. She is taking a multivitamin and calcium supplementation. Otherwise she denies any medical changes. She is not getting much physical activity right now because of the foot issue. She denies any abdominal pain. She denies any fevers or chills.  Review of systems-comprehensive 12 point review of systems was performed and all systems are negative except for what is mentioned in the HPI  She stoppped smoking in October  She had bariatric  evaluation labs in February of this year. Everything was normal except for her lipid panel. Total cholesterol 249, triglycerides 185, HDL 49; CBC, vitamin levels, comprehensive metabolic panel normal   Problem List/Past Medical Randall Hiss M. 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 Redmond Pulling, MD; 12/18/2019 12:42 PM) Gallbladder Surgery - Laparoscopic  Diagnostic Studies History Randall Hiss M. Redmond Pulling, MD; 12/18/2019 12:42 PM) Mammogram within last year Pap Smear 1-5 years ago Colonoscopy never  Allergies (Chanel Teressa Senter, CMA; 12/18/2019 11:52 AM) Vicodin *ANALGESICS - OPIOID* Doxycycline Hyclate *Tetracyclines Avelox *FLUOROQUINOLONES* Allergies Reconciled  Medication History Randall Hiss M. Redmond Pulling, MD; 12/18/2019 12:42 PM) Dexilant (60MG  Capsule DR, 1 (one) Oral daily, Taken starting 09/13/2019) Active. Medications Reconciled Ibuprofen (200MG  Tablet, Oral) Active. Ondansetron HCl (8MG  Tablet, Oral) Active. Daily Multivitamin (Oral daily) Active.  Social History Randall Hiss M. Redmond Pulling, MD; 12/18/2019 12:42 PM) Caffeine use Tea. No drug use Tobacco use Former smoker. No alcohol use  Family History Randall Hiss M. Redmond Pulling, MD; 12/18/2019 12:42 PM) First Degree Relatives No pertinent family history  Pregnancy / Birth History Leighton Ruff. Redmond Pulling, MD; 12/18/2019 12:42 PM) Age at menarche 62 years. Gravida 2 Irregular periods Maternal age 70-20 Para 2  Other Problems Randall Hiss M. Redmond Pulling, MD; 12/18/2019 12:42 PM) Cholelithiasis GASTRO-ESOPHAGEAL REFLUX DISEASE WITH ESOPHAGITIS, WITHOUT BLEEDING (K21.00) She has ongoing symptomatic and worsening reflux. She had an upper GI in November 2017 which showed a small tiny residual sliding hernia and mild reflux and mild esophageal dysmotility at that time.  Upper endoscopy did confirm LA grade a esophagitis  and a small hiatal hernia. I do not believe there is any role in just repairing her hiatal hernia since it is so small. She continues to have GERD symptoms despite switching to dexilant therapy. protonix was no longer effective. continue dexilant in interim. at this point I still think conversion to a gastric bypass with possible repair of recurrent hiatal hernia is the best option for managing her post sleeve gastrectomy reflux.     Review of Systems Minerva Areola M. Kainoa Swoboda MD; 12/18/2019 12:39 PM) All other systems negative  Vitals (Chanel Nolan CMA; 12/18/2019 11:53 AM) 12/18/2019 11:53 AM Weight: 260.38 lb Height: 65in Body Surface Area: 2.21 m Body Mass Index: 43.33 kg/m  Temp.: 97.30F  Pulse: 84 (Regular)  BP: 122/74 (Sitting, Left Arm, Standard)        Physical Exam Minerva Areola M. Minaal Struckman MD; 12/18/2019 12:38 PM)  General Mental Status-Alert. General Appearance-Consistent with stated age. Hydration-Well hydrated. Voice-Normal.  Head and Neck Head-normocephalic, atraumatic with no lesions or palpable masses. Trachea-midline. Thyroid Gland Characteristics - normal size and consistency.  Eye Eyeball - Bilateral-Normal. Sclera/Conjunctiva - Bilateral-No scleral icterus.  Chest and Lung Exam Chest and lung exam reveals -quiet, even and easy respiratory effort with no use of accessory muscles and on auscultation, normal breath sounds, no adventitious sounds and normal vocal resonance. Inspection Chest Wall - Normal. Back - normal.  Breast - Did not examine.  Cardiovascular Cardiovascular examination reveals -normal heart sounds, regular rate and rhythm with no murmurs and normal pedal pulses bilaterally.  Abdomen Inspection Inspection of the abdomen reveals - No Hernias. Skin - Scar - Note: well healed trocar scars. Palpation/Percussion Palpation and Percussion of the abdomen reveal - Soft, Non Tender, No Rebound tenderness, No Rigidity (guarding)  and No hepatosplenomegaly. Auscultation Auscultation of the abdomen reveals - Bowel sounds normal.  Peripheral Vascular Upper Extremity Palpation - Pulses bilaterally normal.  Neurologic Neurologic evaluation reveals -alert and oriented x 3 with no impairment of recent or remote memory. Mental Status-Normal.  Neuropsychiatric The patient's mood and affect are described as -normal. Judgment and Insight-insight is appropriate concerning matters relevant to self.  Musculoskeletal Normal Exam - Left-Upper Extremity Strength Normal and Lower Extremity Strength Normal. Normal Exam - Right-Upper Extremity Strength Normal and Lower Extremity Strength Normal.  Lymphatic Head & Neck  General Head & Neck Lymphatics: Bilateral - Description - Normal. Axillary - Did not examine. Femoral & Inguinal - Did not examine.    Assessment & Plan Minerva Areola M. Seona Clemenson MD; 12/18/2019 12:43 PM)  GASTRO-ESOPHAGEAL REFLUX DISEASE WITH ESOPHAGITIS, WITHOUT BLEEDING (K21.00) Story: She has ongoing symptomatic and worsening reflux. She had an upper GI in November 2017 which showed a small tiny residual sliding hernia and mild reflux and mild esophageal dysmotility at that time. Upper endoscopy did confirm LA grade a esophagitis and a small hiatal hernia. I do not believe there is any role in just repairing her hiatal hernia since it is so small. She continues to have GERD symptoms despite switching to dexilant therapy. protonix was no longer effective. continue dexilant in interim. at this point I still think conversion to a gastric bypass with possible repair of recurrent hiatal hernia is the best option for managing her post sleeve gastrectomy reflux. Impression: The patient meets revisional weight loss surgery criteria. I think the patient would be an acceptable candidate for conversion to Laparoscopic Roux-en-Y Gastric bypass.  We discussed laparoscopic Roux-en-Y gastric bypass. We discussed the  preoperative, operative  and postoperative process. Using diagrams, I explained the surgery in detail including the performance of an EGD near the end of the surgery. We discussed the typical hospital course including a 2-3 day stay baring any complications. The patient was given educational material. I quoted the patient that they can expect to lose 50% of their excess weight with the gastric bypass. We did discuss the possibility of weight regain several years after the procedure.  We discussed the risk and benefits of surgery including but not limited to anesthesia risk, bleeding, infection, anastomotic edema requiring a few additional days in the hospital, postop nausea, possible conversion to open procedure, blood clot formation, anastomotic leak, anastomotic stricture, ulcer formation, death, respiratory complications, intestinal blockage, internal hernia, gallstone formation, vitamin and nutritional deficiencies, hair loss, weight regain injury to surrounding structures, failure to lose weight and mood changes.  We discussed that before and after surgery that there would be an alteration in their diet. I explained that we have put them on a diet 2 weeks before surgery. I also explained that they would be on a liquid diet for 2 weeks after surgery. We discussed that they would have to avoid certain foods such as sugar after surgery. We discussed the importance of physical activity as well as compliance with our dietary and supplement recommendations and routine follow-up.  We discussed that she is slightly at increased risk for readmission, injury to surrounding structures, blood clots, bleeding and leak due to her prior foregut surgery, longer length of stay.   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 She is just now been released from her orthopedic doctor with respect to her ankle issues. so  we talked about the importance of slowly increasing her physical activity. We also discussed the importance of food choices  Current Plans Pt Education - EMW_preopbariatric  HISTORY OF SMOKING [phone]) Impression: I congratulated her on stopping smoking. We will obtain a urine nicotine to confirm   HYPERCHOLESTEREMIA (E78.00)  Mary Sella. Andrey Campanile, MD, FACS General, Bariatric, & Minimally Invasive Surgery [facility], [place]

## 2020-01-14 NOTE — Patient Instructions (Addendum)
DUE TO COVID-19 ONLY ONE VISITOR IS ALLOWED TO COME WITH YOU AND STAY IN THE WAITING ROOM ONLY DURING PRE OP AND PROCEDURE DAY OF SURGERY. THE 1 VISITOR MAY VISIT WITH YOU AFTER SURGERY IN YOUR PRIVATE ROOM DURING VISITING HOURS ONLY!  YOU NEED TO HAVE A COVID 19 TEST ON: 01/17/20 @ 10:00, THIS TEST MUST BE DONE BEFORE SURGERY, COME  [address], [place] , [address].  [facility]) ONCE YOUR COVID TEST IS COMPLETED, PLEASE BEGIN THE QUARANTINE INSTRUCTIONS AS OUTLINED IN YOUR HANDOUT.                BELLAMY RUBEY     Your procedure is scheduled on: 01/21/20   Report to [facility] Main  Entrance   Report to short stay at: 5:30 AM     Call this number if you have problems the morning of surgery [phone]    Remember:   . BRUSH YOUR TEETH MORNING OF SURGERY AND RINSE YOUR MOUTH OUT, NO CHEWING GUM CANDY OR MINTS.     Take these medicines the morning of surgery with A SIP OF WATER: dexlansoprazole,fexofenadine.                                 You may not have any metal on your body including hair pins and              piercings  Do not wear jewelry, make-up, lotions, powders or perfumes, deodorant             Do not wear nail polish on your fingernails.  Do not shave  48 hours prior to surgery.        Do not bring valuables to the hospital. [facility] IS NOT             RESPONSIBLE   FOR VALUABLES.  Contacts, dentures or bridgework may not be worn into surgery.  Leave suitcase in the car. After surgery it may be brought to your room.     _____________________________________________________________________  MORNING OF SURGERY DRINK:   DRINK 1 G2 drink BEFORE YOU LEAVE HOME, DRINK ALL OF THE  G2 DRINK AT ONE TIME. (At 4:30 am)  NO SOLID FOOD AFTER 600 PM THE NIGHT BEFORE YOUR SURGERY. YOU MAY DRINK CLEAR FLUIDS. THE G2 DRINK YOU DRINK BEFORE YOU LEAVE HOME WILL BE THE LAST FLUIDS YOU DRINK BEFORE SURGERY.  PAIN IS EXPECTED AFTER SURGERY AND  WILL NOT BE COMPLETELY ELIMINATED. AMBULATION AND TYLENOL WILL HELP REDUCE INCISIONAL AND GAS PAIN. MOVEMENT IS KEY!  YOU ARE EXPECTED TO BE OUT OF BED WITHIN 4 HOURS OF ADMISSION TO YOUR PATIENT ROOM.  SITTING IN THE RECLINER THROUGHOUT THE DAY IS IMPORTANT FOR DRINKING FLUIDS AND MOVING GAS THROUGHOUT THE GI TRACT.  COMPRESSION STOCKINGS SHOULD BE WORN [facility] STAY UNLESS YOU ARE WALKING.   INCENTIVE SPIROMETER SHOULD BE USED EVERY HOUR WHILE AWAKE TO DECREASE POST-OPERATIVE COMPLICATIONS SUCH AS PNEUMONIA.  WHEN DISCHARGED HOME, IT IS IMPORTANT TO CONTINUE TO WALK EVERY HOUR AND USE THE INCENTIVE SPIROMETER EVERY HOUR.    CLEAR LIQUID DIET   Foods Allowed  Foods Excluded  Coffee and tea, regular and decaf                             liquids that you cannot  Plain Jell-O any favor except red or purple                                           see through such as: Fruit ices (not with fruit pulp)                                     milk, soups, orange juice  Iced Popsicles                                    All solid food Carbonated beverages, regular and diet                                    Cranberry, grape and apple juices Sports drinks like Gatorade Lightly seasoned clear broth or consume(fat free) Sugar, honey syrup  Sample Menu Breakfast                                Lunch                                     Supper Cranberry juice                    Beef broth                            Chicken broth Jell-O                                     Grape juice                           Apple juice Coffee or tea                        Jell-O                                      Popsicle                                                Coffee or tea                        Coffee or tea  _____________________________________________________________________  [facility] - Preparing for  Surgery Before surgery, you can play an important role.  Because skin is not sterile, your  skin needs to be as free of germs as possible.  You can reduce the number of germs on your skin by washing with CHG (chlorahexidine gluconate) soap before surgery.  CHG is an antiseptic cleaner which kills germs and bonds with the skin to continue killing germs even after washing. Please DO NOT use if you have an allergy to CHG or antibacterial soaps.  If your skin becomes reddened/irritated stop using the CHG and inform your nurse when you arrive at Short Stay. Do not shave (including legs and underarms) for at least 48 hours prior to the first CHG shower.  You may shave your face/neck. Please follow these instructions carefully:  1.  Shower with CHG Soap the night before surgery and the  morning of Surgery.  2.  If you choose to wash your hair, wash your hair first as usual with your  normal  shampoo.  3.  After you shampoo, rinse your hair and body thoroughly to remove the  shampoo.                           4.  Use CHG as you would any other liquid soap.  You can apply chg directly  to the skin and wash                       Gently with a scrungie or clean washcloth.  5.  Apply the CHG Soap to your body ONLY FROM THE NECK DOWN.   Do not use on face/ open                           Wound or open sores. Avoid contact with eyes, ears mouth and genitals (private parts).                       Wash face,  Genitals (private parts) with your normal soap.             6.  Wash thoroughly, paying special attention to the area where your surgery  will be performed.  7.  Thoroughly rinse your body with warm water from the neck down.  8.  DO NOT shower/wash with your normal soap after using and rinsing off  the CHG Soap.                9.  Pat yourself dry with a clean towel.            10.  Wear clean pajamas.            11.  Place clean sheets on your bed the night of your first shower and do not  sleep with pets. Day  of Surgery : Do not apply any lotions/deodorants the morning of surgery.  Please wear clean clothes to the hospital/surgery center.  FAILURE TO FOLLOW THESE INSTRUCTIONS MAY RESULT IN THE CANCELLATION OF YOUR SURGERY PATIENT SIGNATURE_________________________________  NURSE SIGNATURE__________________________________  ________________________________________________________________________   Rogelia Mire  An incentive spirometer is a tool that can help keep your lungs clear and active. This tool measures how well you are filling your lungs with each breath. Taking long deep breaths may help reverse or decrease the chance of developing breathing (pulmonary) problems (especially infection) following:  A long period of time when you are unable to move or be active. BEFORE THE PROCEDURE   If the spirometer includes an indicator to  show your best effort, your nurse or respiratory therapist will set it to a desired goal.  If possible, sit up straight or lean slightly forward. Try not to slouch.  Hold the incentive spirometer in an upright position. INSTRUCTIONS FOR USE  1. Sit on the edge of your bed if possible, or sit up as far as you can in bed or on a chair. 2. Hold the incentive spirometer in an upright position. 3. Breathe out normally. 4. Place the mouthpiece in your mouth and seal your lips tightly around it. 5. Breathe in slowly and as deeply as possible, raising the piston or the ball toward the top of the column. 6. Hold your breath for 3-5 seconds or for as long as possible. Allow the piston or ball to fall to the bottom of the column. 7. Remove the mouthpiece from your mouth and breathe out normally. 8. Rest for a few seconds and repeat Steps 1 through 7 at least 10 times every 1-2 hours when you are awake. Take your time and take a few normal breaths between deep breaths. 9. The spirometer may include an indicator to show your best effort. Use the indicator as a goal  to work toward during each repetition. 10. After each set of 10 deep breaths, practice coughing to be sure your lungs are clear. If you have an incision (the cut made at the time of surgery), support your incision when coughing by placing a pillow or rolled up towels firmly against it. Once you are able to get out of bed, walk around indoors and cough well. You may stop using the incentive spirometer when instructed by your caregiver.  RISKS AND COMPLICATIONS  Take your time so you do not get dizzy or light-headed.  If you are in pain, you may need to take or ask for pain medication before doing incentive spirometry. It is harder to take a deep breath if you are having pain. AFTER USE  Rest and breathe slowly and easily.  It can be helpful to keep track of a log of your progress. Your caregiver can provide you with a simple table to help with this. If you are using the spirometer at home, follow these instructions: SEEK MEDICAL CARE IF:   You are having difficultly using the spirometer.  You have trouble using the spirometer as often as instructed.  Your pain medication is not giving enough relief while using the spirometer.  You develop fever of 100.5 F (38.1 C) or higher. SEEK IMMEDIATE MEDICAL CARE IF:   You cough up bloody sputum that had not been present before.  You develop fever of 102 F (38.9 C) or greater.  You develop worsening pain at or near the incision site. MAKE SURE YOU:   Understand these instructions.  Will watch your condition.  Will get help right away if you are not doing well or get worse. Document Released: 01/23/2007 Document Revised: 12/05/2011 Document Reviewed: 03/26/2007 [facility] Patient Information 2014 [place], [place].   ________________________________________________________________________

## 2020-01-15 ENCOUNTER — Other Ambulatory Visit: Payer: Self-pay

## 2020-01-15 ENCOUNTER — Encounter (HOSPITAL_COMMUNITY): Payer: Self-pay

## 2020-01-15 ENCOUNTER — Encounter (HOSPITAL_COMMUNITY)
Admission: RE | Admit: 2020-01-15 | Discharge: 2020-01-15 | Disposition: A | Discharge status: Home / Self Care | Payer: BC Managed Care – PPO | Source: Ambulatory Visit | Attending: General Surgery | Admitting: General Surgery

## 2020-01-15 DIAGNOSIS — Z01812 Encounter for preprocedural laboratory examination: Secondary | ICD-10-CM | POA: Insufficient documentation

## 2020-01-15 NOTE — Progress Notes (Signed)
PCP - Dr. Alberteen Sam. LOV: 10/30/19 Cardiologist -   Chest x-ray -  EKG -  Stress Test -  ECHO -  Cardiac Cath -   Sleep Study -  CPAP -   Fasting Blood Sugar -  Checks Blood Sugar _____ times a day  Blood Thinner Instructions: Aspirin Instructions: Last Dose:  Anesthesia review:   Patient denies shortness of breath, fever, cough and chest pain at PAT appointment   Patient verbalized understanding of instructions that were given to them at the PAT appointment. Patient was also instructed that they will need to review over the PAT instructions again at home before surgery.

## 2020-01-16 ENCOUNTER — Encounter (HOSPITAL_COMMUNITY)
Admission: RE | Admit: 2020-01-16 | Discharge: 2020-01-16 | Disposition: A | Discharge status: Home / Self Care | Payer: BC Managed Care – PPO | Source: Ambulatory Visit | Attending: General Surgery | Admitting: General Surgery

## 2020-01-16 DIAGNOSIS — Z01812 Encounter for preprocedural laboratory examination: Secondary | ICD-10-CM | POA: Diagnosis not present

## 2020-01-16 LAB — CBC WITH DIFFERENTIAL/PLATELET
Abs Immature Granulocytes: 0.01 10*3/uL (ref 0.00–0.07)
Basophils Absolute: 0.1 10*3/uL (ref 0.0–0.1)
Basophils Relative: 1 %
Eosinophils Absolute: 0.1 10*3/uL (ref 0.0–0.5)
Eosinophils Relative: 1 %
HCT: 41.4 % (ref 36.0–46.0)
Hemoglobin: 13.5 g/dL (ref 12.0–15.0)
Immature Granulocytes: 0 %
Lymphocytes Relative: 34 %
Lymphs Abs: 2.5 10*3/uL (ref 0.7–4.0)
MCH: 29.2 pg (ref 26.0–34.0)
MCHC: 32.6 g/dL (ref 30.0–36.0)
MCV: 89.6 fL (ref 80.0–100.0)
Monocytes Absolute: 0.4 10*3/uL (ref 0.1–1.0)
Monocytes Relative: 6 %
Neutro Abs: 4.2 10*3/uL (ref 1.7–7.7)
Neutrophils Relative %: 58 %
Platelets: 386 10*3/uL (ref 150–400)
RBC: 4.62 MIL/uL (ref 3.87–5.11)
RDW: 11.9 % (ref 11.5–15.5)
WBC: 7.3 10*3/uL (ref 4.0–10.5)
nRBC: 0 % (ref 0.0–0.2)

## 2020-01-16 LAB — COMPREHENSIVE METABOLIC PANEL WITH GFR
ALT: 31 U/L (ref 0–44)
AST: 23 U/L (ref 15–41)
Albumin: 3.6 g/dL (ref 3.5–5.0)
Alkaline Phosphatase: 69 U/L (ref 38–126)
Anion gap: 8 (ref 5–15)
BUN: 22 mg/dL — ABNORMAL HIGH (ref 6–20)
CO2: 27 mmol/L (ref 22–32)
Calcium: 9.1 mg/dL (ref 8.9–10.3)
Chloride: 104 mmol/L (ref 98–111)
Creatinine, Ser: 0.72 mg/dL (ref 0.44–1.00)
GFR calc Af Amer: >60 mL/min
GFR calc non Af Amer: >60 mL/min
Glucose, Bld: 109 mg/dL — ABNORMAL HIGH (ref 70–99)
Potassium: 4.2 mmol/L (ref 3.5–5.1)
Sodium: 139 mmol/L (ref 135–145)
Total Bilirubin: 0.3 mg/dL (ref 0.3–1.2)
Total Protein: 7.1 g/dL (ref 6.5–8.1)

## 2020-01-17 ENCOUNTER — Other Ambulatory Visit (HOSPITAL_COMMUNITY)
Admission: RE | Admit: 2020-01-17 | Discharge: 2020-01-17 | Disposition: A | Discharge status: Home / Self Care | Payer: BC Managed Care – PPO | Source: Ambulatory Visit | Attending: General Surgery | Admitting: General Surgery

## 2020-01-17 DIAGNOSIS — Z01812 Encounter for preprocedural laboratory examination: Secondary | ICD-10-CM | POA: Insufficient documentation

## 2020-01-17 DIAGNOSIS — Z20822 Contact with and (suspected) exposure to covid-19: Secondary | ICD-10-CM | POA: Diagnosis not present

## 2020-01-17 LAB — SARS CORONAVIRUS 2 (TAT 6-24 HRS): SARS Coronavirus 2: NEGATIVE

## 2020-01-20 MED ORDER — BUPIVACAINE LIPOSOME 1.3 % IJ SUSP
20.0000 mL | INTRAMUSCULAR | Status: DC
  Filled 2020-01-20: qty 20

## 2020-01-20 NOTE — Anesthesia Preprocedure Evaluation (Addendum)
Anesthesia Evaluation  Patient identified by MRN, date of birth, ID band Patient awake    Reviewed: Allergy & Precautions, H&P , NPO status , Patient's Chart, lab work & pertinent test results  History of Anesthesia Complications Negative for: history of anesthetic complications  Airway Mallampati: II  TM Distance: >3 FB Neck ROM: Full    Dental no notable dental hx. (+) Dental Advisory Given   Pulmonary former smoker,    Pulmonary exam normal        Cardiovascular hypertension, negative cardio ROS Normal cardiovascular exam     Neuro/Psych PSYCHIATRIC DISORDERS negative neurological ROS     GI/Hepatic Neg liver ROS, GERD  ,  Endo/Other  Morbid obesity  Renal/GU negative Renal ROS  negative genitourinary   Musculoskeletal negative musculoskeletal ROS (+)   Abdominal   Peds negative pediatric ROS (+)  Hematology negative hematology ROS (+)   Anesthesia Other Findings   Reproductive/Obstetrics Negative pregnancy test.                            Anesthesia Physical  Anesthesia Plan  ASA: III  Anesthesia Plan: General   Post-op Pain Management:    Induction: Intravenous  PONV Risk Score and Plan: 4 or greater and Ondansetron, Midazolam and Scopolamine patch - Pre-op  Airway Management Planned: Oral ETT  Additional Equipment:   Intra-op Plan:   Post-operative Plan: Extubation in OR  Informed Consent: I have reviewed the patients History and Physical, chart, labs and discussed the procedure including the risks, benefits and alternatives for the proposed anesthesia with the patient or authorized representative who has indicated his/her understanding and acceptance.     Dental advisory given  Plan Discussed with: Anesthesiologist, CRNA and Surgeon  Anesthesia Plan Comments:        Anesthesia Quick Evaluation

## 2020-01-21 ENCOUNTER — Encounter (HOSPITAL_COMMUNITY)
Admission: RE | Disposition: A | Discharge status: Home / Self Care | Payer: Self-pay | Source: Home / Self Care | Attending: General Surgery

## 2020-01-21 ENCOUNTER — Encounter (HOSPITAL_COMMUNITY): Payer: Self-pay | Admitting: General Surgery

## 2020-01-21 ENCOUNTER — Other Ambulatory Visit: Payer: Self-pay

## 2020-01-21 ENCOUNTER — Inpatient Hospital Stay (HOSPITAL_COMMUNITY): Payer: BC Managed Care – PPO | Admitting: Anesthesiology

## 2020-01-21 ENCOUNTER — Inpatient Hospital Stay (HOSPITAL_COMMUNITY)
Admission: RE | Admit: 2020-01-21 | Discharge: 2020-01-22 | DRG: 621 | Disposition: A | Discharge status: Home / Self Care | Payer: BC Managed Care – PPO | Attending: General Surgery | Admitting: General Surgery

## 2020-01-21 DIAGNOSIS — K219 Gastro-esophageal reflux disease without esophagitis: Secondary | ICD-10-CM | POA: Diagnosis present

## 2020-01-21 DIAGNOSIS — K449 Diaphragmatic hernia without obstruction or gangrene: Secondary | ICD-10-CM | POA: Diagnosis present

## 2020-01-21 DIAGNOSIS — K2970 Gastritis, unspecified, without bleeding: Secondary | ICD-10-CM | POA: Diagnosis present

## 2020-01-21 DIAGNOSIS — Z888 Allergy status to other drugs, medicaments and biological substances status: Secondary | ICD-10-CM | POA: Diagnosis not present

## 2020-01-21 DIAGNOSIS — Z87891 Personal history of nicotine dependence: Secondary | ICD-10-CM

## 2020-01-21 DIAGNOSIS — Z79899 Other long term (current) drug therapy: Secondary | ICD-10-CM

## 2020-01-21 DIAGNOSIS — Z9851 Tubal ligation status: Secondary | ICD-10-CM | POA: Diagnosis not present

## 2020-01-21 DIAGNOSIS — J302 Other seasonal allergic rhinitis: Secondary | ICD-10-CM | POA: Diagnosis present

## 2020-01-21 DIAGNOSIS — Z6841 Body Mass Index (BMI) 40.0 and over, adult: Secondary | ICD-10-CM | POA: Diagnosis not present

## 2020-01-21 DIAGNOSIS — E7800 Pure hypercholesterolemia, unspecified: Secondary | ICD-10-CM | POA: Diagnosis present

## 2020-01-21 DIAGNOSIS — E785 Hyperlipidemia, unspecified: Secondary | ICD-10-CM | POA: Diagnosis present

## 2020-01-21 DIAGNOSIS — K209 Esophagitis, unspecified without bleeding: Secondary | ICD-10-CM | POA: Diagnosis present

## 2020-01-21 DIAGNOSIS — Z9884 Bariatric surgery status: Secondary | ICD-10-CM

## 2020-01-21 HISTORY — PX: GASTRIC ROUX-EN-Y: SHX5262

## 2020-01-21 LAB — TYPE AND SCREEN
ABO/RH(D): A POS
Antibody Screen: NEGATIVE

## 2020-01-21 LAB — PREGNANCY, URINE: Preg Test, Ur: NEGATIVE

## 2020-01-21 LAB — HEMOGLOBIN AND HEMATOCRIT, BLOOD
HCT: 42 % (ref 36.0–46.0)
Hemoglobin: 13.4 g/dL (ref 12.0–15.0)

## 2020-01-21 SURGERY — LAPAROSCOPIC ROUX-EN-Y GASTRIC BYPASS WITH UPPER ENDOSCOPY
Anesthesia: General | Site: Abdomen

## 2020-01-21 MED ORDER — LACTATED RINGERS IV SOLN: INTRAVENOUS | Status: DC

## 2020-01-21 MED ORDER — SCOPOLAMINE 1 MG/3DAYS TD PT72: 1.0000 | MEDICATED_PATCH | TRANSDERMAL | Status: DC

## 2020-01-21 MED ORDER — OXYCODONE HCL 5 MG/5ML PO SOLN: 5.0000 mg | Freq: Four times a day (QID) | ORAL | Status: DC | PRN

## 2020-01-21 MED ORDER — BUPIVACAINE LIPOSOME 1.3 % IJ SUSP
INTRAMUSCULAR | Status: DC | PRN
  Administered 2020-01-21: 20 mL

## 2020-01-21 MED ORDER — PHENYLEPHRINE 40 MCG/ML (10ML) SYRINGE FOR IV PUSH (FOR BLOOD PRESSURE SUPPORT)
PREFILLED_SYRINGE | INTRAVENOUS | Status: DC | PRN
  Administered 2020-01-21 (×2): 120 ug via INTRAVENOUS

## 2020-01-21 MED ORDER — MIDAZOLAM HCL 2 MG/2ML IJ SOLN
INTRAMUSCULAR | Status: AC
  Filled 2020-01-21: qty 2

## 2020-01-21 MED ORDER — GABAPENTIN 100 MG PO CAPS
200.0000 mg | ORAL_CAPSULE | Freq: Two times a day (BID) | ORAL | Status: DC
  Administered 2020-01-21: 200 mg via ORAL
  Filled 2020-01-21 (×2): qty 2

## 2020-01-21 MED ORDER — KETAMINE HCL 10 MG/ML IJ SOLN
INTRAMUSCULAR | Status: DC | PRN
  Administered 2020-01-21: 40 mg via INTRAVENOUS

## 2020-01-21 MED ORDER — DEXAMETHASONE SODIUM PHOSPHATE 10 MG/ML IJ SOLN
INTRAMUSCULAR | Status: DC | PRN
  Administered 2020-01-21: 6 mg via INTRAVENOUS

## 2020-01-21 MED ORDER — ONDANSETRON HCL 4 MG/2ML IJ SOLN
4.0000 mg | Freq: Four times a day (QID) | INTRAMUSCULAR | Status: DC | PRN
  Administered 2020-01-21 – 2020-01-22 (×2): 4 mg via INTRAVENOUS
  Filled 2020-01-21 (×2): qty 2

## 2020-01-21 MED ORDER — SODIUM CHLORIDE 0.9 % IV SOLN
2.0000 g | INTRAVENOUS | Status: AC
  Administered 2020-01-21: 08:00:00 2 g via INTRAVENOUS
  Filled 2020-01-21: qty 2

## 2020-01-21 MED ORDER — PHENYLEPHRINE HCL-NACL 10-0.9 MG/250ML-% IV SOLN
INTRAVENOUS | Status: DC | PRN
  Administered 2020-01-21: 35 ug/min via INTRAVENOUS

## 2020-01-21 MED ORDER — ENSURE MAX PROTEIN PO LIQD
2.0000 [oz_av] | ORAL | Status: DC
  Administered 2020-01-22 (×5): 2 [oz_av] via ORAL

## 2020-01-21 MED ORDER — 0.9 % SODIUM CHLORIDE (POUR BTL) OPTIME
TOPICAL | Status: DC | PRN
  Administered 2020-01-21: 1000 mL

## 2020-01-21 MED ORDER — PROPOFOL 10 MG/ML IV BOLUS
INTRAVENOUS | Status: AC
  Filled 2020-01-21: qty 20

## 2020-01-21 MED ORDER — ACETAMINOPHEN 500 MG PO TABS
1000.0000 mg | ORAL_TABLET | Freq: Three times a day (TID) | ORAL | Status: DC
  Administered 2020-01-22 (×2): 1000 mg via ORAL
  Filled 2020-01-21 (×2): qty 2

## 2020-01-21 MED ORDER — "VISTASEAL 4 ML SINGLE DOSE KIT "
4.0000 mL | PACK | Freq: Once | CUTANEOUS | Status: AC
  Administered 2020-01-21: 4 mL via TOPICAL
  Filled 2020-01-21: qty 4

## 2020-01-21 MED ORDER — SUGAMMADEX SODIUM 500 MG/5ML IV SOLN
INTRAVENOUS | Status: AC
  Filled 2020-01-21: qty 5

## 2020-01-21 MED ORDER — SODIUM CHLORIDE (PF) 0.9 % IJ SOLN
INTRAMUSCULAR | Status: AC
  Filled 2020-01-21: qty 50

## 2020-01-21 MED ORDER — SUGAMMADEX SODIUM 200 MG/2ML IV SOLN
INTRAVENOUS | Status: DC | PRN
  Administered 2020-01-21: 300 mg via INTRAVENOUS

## 2020-01-21 MED ORDER — PANTOPRAZOLE SODIUM 40 MG IV SOLR
40.0000 mg | Freq: Every day | INTRAVENOUS | Status: DC
  Administered 2020-01-21: 21:00:00 40 mg via INTRAVENOUS
  Filled 2020-01-21: qty 40

## 2020-01-21 MED ORDER — ROCURONIUM BROMIDE 10 MG/ML (PF) SYRINGE
PREFILLED_SYRINGE | INTRAVENOUS | Status: AC
  Filled 2020-01-21: qty 10

## 2020-01-21 MED ORDER — ENOXAPARIN SODIUM 30 MG/0.3ML ~~LOC~~ SOLN
30.0000 mg | Freq: Two times a day (BID) | SUBCUTANEOUS | Status: DC
  Administered 2020-01-21 – 2020-01-22 (×2): 30 mg via SUBCUTANEOUS
  Filled 2020-01-21 (×2): qty 0.3

## 2020-01-21 MED ORDER — SUCCINYLCHOLINE CHLORIDE 200 MG/10ML IV SOSY
PREFILLED_SYRINGE | INTRAVENOUS | Status: AC
  Filled 2020-01-21: qty 10

## 2020-01-21 MED ORDER — DIPHENHYDRAMINE HCL 50 MG/ML IJ SOLN: 12.5000 mg | Freq: Three times a day (TID) | INTRAMUSCULAR | Status: DC | PRN

## 2020-01-21 MED ORDER — HYDROMORPHONE HCL 1 MG/ML IJ SOLN
0.2500 mg | INTRAMUSCULAR | Status: DC | PRN
  Administered 2020-01-21: 12:00:00 0.5 mg via INTRAVENOUS

## 2020-01-21 MED ORDER — GABAPENTIN 300 MG PO CAPS
300.0000 mg | ORAL_CAPSULE | ORAL | Status: AC
  Administered 2020-01-21: 300 mg via ORAL
  Filled 2020-01-21: qty 1

## 2020-01-21 MED ORDER — PROMETHAZINE HCL 25 MG/ML IJ SOLN: 12.5000 mg | Freq: Four times a day (QID) | INTRAMUSCULAR | Status: DC | PRN

## 2020-01-21 MED ORDER — FENTANYL CITRATE (PF) 100 MCG/2ML IJ SOLN
INTRAMUSCULAR | Status: AC
  Filled 2020-01-21: qty 2

## 2020-01-21 MED ORDER — HYDROMORPHONE HCL 1 MG/ML IJ SOLN
INTRAMUSCULAR | Status: AC
  Filled 2020-01-21: qty 1

## 2020-01-21 MED ORDER — FENTANYL CITRATE (PF) 100 MCG/2ML IJ SOLN
25.0000 ug | INTRAMUSCULAR | Status: DC | PRN
  Administered 2020-01-21: 50 ug via INTRAVENOUS
  Administered 2020-01-21 (×2): 25 ug via INTRAVENOUS
  Administered 2020-01-21: 50 ug via INTRAVENOUS

## 2020-01-21 MED ORDER — DEXAMETHASONE SODIUM PHOSPHATE 4 MG/ML IJ SOLN: 4.0000 mg | INTRAMUSCULAR | Status: DC

## 2020-01-21 MED ORDER — DEXAMETHASONE SODIUM PHOSPHATE 10 MG/ML IJ SOLN
INTRAMUSCULAR | Status: AC
  Filled 2020-01-21: qty 1

## 2020-01-21 MED ORDER — ONDANSETRON HCL 4 MG/2ML IJ SOLN
INTRAMUSCULAR | Status: DC | PRN
  Administered 2020-01-21: 4 mg via INTRAVENOUS

## 2020-01-21 MED ORDER — KCL IN DEXTROSE-NACL 20-5-0.45 MEQ/L-%-% IV SOLN
INTRAVENOUS | Status: DC
  Filled 2020-01-21 (×3): qty 1000

## 2020-01-21 MED ORDER — FENTANYL CITRATE (PF) 100 MCG/2ML IJ SOLN: 25.0000 ug | INTRAMUSCULAR | Status: DC | PRN

## 2020-01-21 MED ORDER — APREPITANT 40 MG PO CAPS
40.0000 mg | ORAL_CAPSULE | ORAL | Status: AC
  Administered 2020-01-21: 06:00:00 40 mg via ORAL
  Filled 2020-01-21: qty 1

## 2020-01-21 MED ORDER — KETAMINE HCL 10 MG/ML IJ SOLN
INTRAMUSCULAR | Status: AC
  Filled 2020-01-21: qty 1

## 2020-01-21 MED ORDER — FENTANYL CITRATE (PF) 250 MCG/5ML IJ SOLN
INTRAMUSCULAR | Status: AC
  Filled 2020-01-21: qty 5

## 2020-01-21 MED ORDER — CHLORHEXIDINE GLUCONATE 4 % EX LIQD: 60.0000 mL | Freq: Once | CUTANEOUS | Status: DC

## 2020-01-21 MED ORDER — SUCCINYLCHOLINE CHLORIDE 200 MG/10ML IV SOSY
PREFILLED_SYRINGE | INTRAVENOUS | Status: DC | PRN
  Administered 2020-01-21: 140 mg via INTRAVENOUS

## 2020-01-21 MED ORDER — SCOPOLAMINE 1 MG/3DAYS TD PT72
1.0000 | MEDICATED_PATCH | TRANSDERMAL | Status: DC
  Administered 2020-01-21: 07:00:00 1.5 mg via TRANSDERMAL
  Filled 2020-01-21: qty 1

## 2020-01-21 MED ORDER — ROCURONIUM BROMIDE 50 MG/5ML IV SOSY
PREFILLED_SYRINGE | INTRAVENOUS | Status: DC | PRN
  Administered 2020-01-21: 20 mg via INTRAVENOUS
  Administered 2020-01-21: 70 mg via INTRAVENOUS
  Administered 2020-01-21: 20 mg via INTRAVENOUS

## 2020-01-21 MED ORDER — HEPARIN SODIUM (PORCINE) 5000 UNIT/ML IJ SOLN
5000.0000 [IU] | INTRAMUSCULAR | Status: AC
  Administered 2020-01-21: 5000 [IU] via SUBCUTANEOUS
  Filled 2020-01-21: qty 1

## 2020-01-21 MED ORDER — HYDRALAZINE HCL 20 MG/ML IJ SOLN: 10.0000 mg | INTRAMUSCULAR | Status: DC | PRN

## 2020-01-21 MED ORDER — LIDOCAINE 2% (20 MG/ML) 5 ML SYRINGE
INTRAMUSCULAR | Status: DC | PRN
  Administered 2020-01-21: 1.5 mg/kg/h via INTRAVENOUS

## 2020-01-21 MED ORDER — LIDOCAINE HCL 2 % IJ SOLN
INTRAMUSCULAR | Status: AC
  Filled 2020-01-21: qty 20

## 2020-01-21 MED ORDER — SIMETHICONE 80 MG PO CHEW: 80.0000 mg | CHEWABLE_TABLET | Freq: Four times a day (QID) | ORAL | Status: DC | PRN

## 2020-01-21 MED ORDER — SODIUM CHLORIDE (PF) 0.9 % IJ SOLN
INTRAMUSCULAR | Status: DC | PRN
  Administered 2020-01-21: 50 mL

## 2020-01-21 MED ORDER — PROPOFOL 10 MG/ML IV BOLUS
INTRAVENOUS | Status: DC | PRN
  Administered 2020-01-21: 200 mg via INTRAVENOUS

## 2020-01-21 MED ORDER — MIDAZOLAM HCL 5 MG/5ML IJ SOLN
INTRAMUSCULAR | Status: DC | PRN
  Administered 2020-01-21: 2 mg via INTRAVENOUS

## 2020-01-21 MED ORDER — LIDOCAINE 2% (20 MG/ML) 5 ML SYRINGE
INTRAMUSCULAR | Status: AC
  Filled 2020-01-21: qty 5

## 2020-01-21 MED ORDER — ACETAMINOPHEN 500 MG PO TABS
1000.0000 mg | ORAL_TABLET | ORAL | Status: AC
  Administered 2020-01-21: 1000 mg via ORAL
  Filled 2020-01-21: qty 2

## 2020-01-21 MED ORDER — ONDANSETRON HCL 4 MG/2ML IJ SOLN
INTRAMUSCULAR | Status: AC
  Filled 2020-01-21: qty 2

## 2020-01-21 MED ORDER — LACTATED RINGERS IR SOLN
Status: DC | PRN
  Administered 2020-01-21: 1000 mL

## 2020-01-21 MED ORDER — LIDOCAINE 2% (20 MG/ML) 5 ML SYRINGE
INTRAMUSCULAR | Status: DC | PRN
  Administered 2020-01-21: 100 mg via INTRAVENOUS

## 2020-01-21 MED ORDER — FENTANYL CITRATE (PF) 250 MCG/5ML IJ SOLN
INTRAMUSCULAR | Status: DC | PRN
  Administered 2020-01-21 (×3): 50 ug via INTRAVENOUS
  Administered 2020-01-21: 100 ug via INTRAVENOUS

## 2020-01-21 MED ORDER — PHENYLEPHRINE HCL (PRESSORS) 10 MG/ML IV SOLN
INTRAVENOUS | Status: AC
  Filled 2020-01-21: qty 1

## 2020-01-21 MED ORDER — ACETAMINOPHEN 160 MG/5ML PO SOLN
1000.0000 mg | Freq: Three times a day (TID) | ORAL | Status: DC
  Administered 2020-01-21: 1000 mg via ORAL
  Filled 2020-01-21: qty 40.6

## 2020-01-21 MED ORDER — PROMETHAZINE HCL 25 MG/ML IJ SOLN: 6.2500 mg | INTRAMUSCULAR | Status: DC | PRN

## 2020-01-21 SURGICAL SUPPLY — 97 items
APL LAPSCP 35 DL APL RGD (MISCELLANEOUS) ×2
APL PRP STRL LF DISP 70% ISPRP (MISCELLANEOUS) ×2
APL SKNCLS STERI-STRIP NONHPOA (GAUZE/BANDAGES/DRESSINGS) ×1
APL SWBSTK 6 STRL LF DISP (MISCELLANEOUS)
APPLICATOR COTTON TIP 6 STRL (MISCELLANEOUS) IMPLANT
APPLICATOR COTTON TIP 6IN STRL (MISCELLANEOUS)
APPLICATOR VISTASEAL 35 (MISCELLANEOUS) ×6 IMPLANT
APPLIER CLIP ROT 13.4 12 LRG (CLIP)
APR CLP LRG 13.4X12 ROT 20 MLT (CLIP)
BENZOIN TINCTURE PRP APPL 2/3 (GAUZE/BANDAGES/DRESSINGS) ×3 IMPLANT
BLADE SURG SZ11 CARB STEEL (BLADE) ×3 IMPLANT
BNDG ADH 1X3 SHEER STRL LF (GAUZE/BANDAGES/DRESSINGS) ×18 IMPLANT
BNDG ADH THN 3X1 STRL LF (GAUZE/BANDAGES/DRESSINGS) ×6
CABLE HIGH FREQUENCY MONO STRZ (ELECTRODE) IMPLANT
CHLORAPREP W/TINT 26 (MISCELLANEOUS) ×6 IMPLANT
CLIP APPLIE ROT 13.4 12 LRG (CLIP) IMPLANT
CLIP SUT LAPRA TY ABSORB (SUTURE) ×6 IMPLANT
CLOSURE WOUND 1/2 X4 (GAUZE/BANDAGES/DRESSINGS) ×1
COVER BACK TABLE 60X90IN (DRAPES) ×2 IMPLANT
COVER WAND RF STERILE (DRAPES) IMPLANT
CUTTER FLEX LINEAR 45M (STAPLE) IMPLANT
DEVICE SUT QUICK LOAD TK 5 (STAPLE) IMPLANT
DEVICE SUT TI-KNOT TK 5X26 (MISCELLANEOUS) IMPLANT
DEVICE SUTURE ENDOST 10MM (ENDOMECHANICALS) ×3 IMPLANT
DEVICE TI KNOT TK5 (MISCELLANEOUS)
DRAIN PENROSE 0.25X18 (DRAIN) ×3 IMPLANT
DRAPE CARDIOVASC SPLIT 88X140 (DRAPES) ×2 IMPLANT
DRAPE CV SPLIT W-CLR ANES SCRN (DRAPES) ×2 IMPLANT
ELECT REM PT RETURN 15FT ADLT (MISCELLANEOUS) ×3 IMPLANT
GAUZE 4X4 16PLY RFD (DISPOSABLE) ×3 IMPLANT
GAUZE SPONGE 4X4 12PLY STRL (GAUZE/BANDAGES/DRESSINGS) IMPLANT
GLOVE BIO SURGEON STRL SZ 6.5 (GLOVE) ×1 IMPLANT
GLOVE BIO SURGEON STRL SZ7.5 (GLOVE) ×2 IMPLANT
GLOVE BIO SURGEONS STRL SZ 6.5 (GLOVE) ×1
GLOVE BIOGEL PI IND STRL 6.5 (GLOVE) IMPLANT
GLOVE BIOGEL PI IND STRL 7.0 (GLOVE) IMPLANT
GLOVE BIOGEL PI INDICATOR 6.5 (GLOVE) ×2
GLOVE BIOGEL PI INDICATOR 7.0 (GLOVE) ×4
GLOVE ECLIPSE 7.0 STRL STRAW (GLOVE) ×2 IMPLANT
GLOVE INDICATOR 8.0 STRL GRN (GLOVE) ×3 IMPLANT
GLOVE SURG SS PI 7.0 STRL IVOR (GLOVE) ×4 IMPLANT
GOWN STRL REUS W/TWL LRG LVL3 (GOWN DISPOSABLE) ×4 IMPLANT
GOWN STRL REUS W/TWL XL LVL3 (GOWN DISPOSABLE) ×8 IMPLANT
HOVERMATT SINGLE USE (MISCELLANEOUS) ×3 IMPLANT
KIT BASIN (CUSTOM PROCEDURE TRAY) ×3 IMPLANT
KIT GASTRIC LAVAGE 34FR ADT (SET/KITS/TRAYS/PACK) ×3 IMPLANT
KIT TURNOVER KIT A (KITS) IMPLANT
MARKER SKIN DUAL TIP RULER LAB (MISCELLANEOUS) ×3 IMPLANT
NDL SPNL 22GX3.5 QUINCKE BK (NEEDLE) ×1 IMPLANT
NEEDLE SPNL 22GX3.5 QUINCKE BK (NEEDLE) ×3 IMPLANT
PENCIL SMOKE EVACUATOR (MISCELLANEOUS) IMPLANT
QUICK LOAD TK 5 (STAPLE)
RELOAD 45 VASCULAR/THIN (ENDOMECHANICALS) IMPLANT
RELOAD ENDO STITCH 2.0 (ENDOMECHANICALS) ×33
RELOAD STAPLE 45 2.5 WHT GRN (ENDOMECHANICALS) IMPLANT
RELOAD STAPLE 45 3.5 BLU ETS (ENDOMECHANICALS) IMPLANT
RELOAD STAPLE 60 2.6 WHT THN (STAPLE) ×2 IMPLANT
RELOAD STAPLE 60 3.6 BLU REG (STAPLE) ×2 IMPLANT
RELOAD STAPLE 60 3.8 GOLD REG (STAPLE) ×1 IMPLANT
RELOAD STAPLE TA45 3.5 REG BLU (ENDOMECHANICALS) IMPLANT
RELOAD STAPLER BLUE 60MM (STAPLE) ×3 IMPLANT
RELOAD STAPLER GOLD 60MM (STAPLE) ×1 IMPLANT
RELOAD STAPLER WHITE 60MM (STAPLE) ×2 IMPLANT
RELOAD SUT SNGL STCH ABSRB 2-0 (ENDOMECHANICALS) ×5 IMPLANT
RELOAD SUT SNGL STCH BLK 2-0 (ENDOMECHANICALS) ×4 IMPLANT
SCISSORS LAP 5X45 EPIX DISP (ENDOMECHANICALS) ×3 IMPLANT
SET IRRIG TUBING LAPAROSCOPIC (IRRIGATION / IRRIGATOR) ×3 IMPLANT
SET TUBE SMOKE EVAC HIGH FLOW (TUBING) ×3 IMPLANT
SHEARS HARMONIC ACE PLUS 45CM (MISCELLANEOUS) ×3 IMPLANT
SLEEVE XCEL OPT CAN 5 100 (ENDOMECHANICALS) ×3 IMPLANT
SOL ANTI FOG 6CC (MISCELLANEOUS) ×1 IMPLANT
SOLUTION ANTI FOG 6CC (MISCELLANEOUS) ×2
STAPLER ECHELON BIOABSB 60 FLE (MISCELLANEOUS) IMPLANT
STAPLER ECHELON LONG 60 440 (INSTRUMENTS) ×3 IMPLANT
STAPLER RELOAD BLUE 60MM (STAPLE) ×9
STAPLER RELOAD GOLD 60MM (STAPLE) ×3
STAPLER RELOAD WHITE 60MM (STAPLE) ×6
STRIP CLOSURE SKIN 1/2X4 (GAUZE/BANDAGES/DRESSINGS) ×2 IMPLANT
SURGILUBE 2OZ TUBE FLIPTOP (MISCELLANEOUS) ×1 IMPLANT
SUT MNCRL AB 4-0 PS2 18 (SUTURE) ×3 IMPLANT
SUT RELOAD ENDO STITCH 2 48X1 (ENDOMECHANICALS) ×7
SUT RELOAD ENDO STITCH 2.0 (ENDOMECHANICALS) ×4
SUT SURGIDAC NAB ES-9 0 48 120 (SUTURE) IMPLANT
SUT VIC AB 2-0 SH 27 (SUTURE) ×6
SUT VIC AB 2-0 SH 27X BRD (SUTURE) ×1 IMPLANT
SUTURE RELOAD END STTCH 2 48X1 (ENDOMECHANICALS) ×7 IMPLANT
SUTURE RELOAD ENDO STITCH 2.0 (ENDOMECHANICALS) ×4 IMPLANT
SYR 20ML LL LF (SYRINGE) ×6 IMPLANT
TOWEL OR 17X26 10 PK STRL BLUE (TOWEL DISPOSABLE) ×3 IMPLANT
TOWEL OR NON WOVEN STRL DISP B (DISPOSABLE) ×3 IMPLANT
TRAY FOLEY MTR SLVR 16FR STAT (SET/KITS/TRAYS/PACK) IMPLANT
TROCAR BLADELESS OPT 5 100 (ENDOMECHANICALS) ×3 IMPLANT
TROCAR UNIVERSAL OPT 12M 100M (ENDOMECHANICALS) ×9 IMPLANT
TROCAR XCEL 12X100 BLDLESS (ENDOMECHANICALS) ×3 IMPLANT
TUBE CALIBRATION LAPBAND (TUBING) ×2 IMPLANT
TUBING CONNECTING 10 (TUBING) ×3 IMPLANT
TUBING CONNECTING 10' (TUBING) ×1

## 2020-01-21 NOTE — H&P (Signed)
Brenda Ware is an 54 y.o. female.   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 She denies any changes since seen in clinic about1 month ago.   The patient is a 54 year old female presenting status-post bariatric surgery. 54 yo female for followup after undergoing LSG with hiatal hernia repair on 06/14/2013. Her preop wt was 278lbs.  I last saw her in sept 2020 Her weight at that time was 227 pounds. She states that she is continuing to have ongoing and worsening issues with heartburn. She states that she gavascon all the time and eats them like candy. She will have an acid sensation in her throat if she lays flat. She cannot eat any solid food after early evening. Sometimes she will wake up in the morning with an acid taste in her throat. She will also wake up in the morning sometimes with sensation of something sitting in her throat. She has not regurgitated or vomited for about 2-3 months. But the acid sensation is pretty much there all the time. She has to sleep upright. She is taking dexilant now. not any real difference b/t dexilant and the protonix. She did undergo an upper endoscopy in June by Dr. Orvan Falconer that showed an LA grade a esophagitis, small hiatal hernia, 2 or 3 expose staples in the gastric body and some gastritis. Esophageal biopsy showed reflux changes. Gastric biopsy was negative for H. pylori. she has been released from her ankle surgeon. She is taking a multivitamin and calcium supplementation. Otherwise she denies any medical changes. She is not getting much physical activity right now because of the foot issue. She denies any abdominal pain. She denies any fevers or chills.  Review of systems-comprehensive 12 point review of systems was performed and all systems are negative except for what is mentioned in the HPI  She stoppped smoking in  October  She had bariatric evaluation labs in February of this year. Everything was normal except for her lipid panel. Total cholesterol 249, triglycerides 185, HDL 49; CBC, vitamin levels, comprehensive metabolic panel normal  Past Medical History:  Diagnosis Date  . GERD (gastroesophageal reflux disease) 06-17-13   no problems in several months  . 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 Malnutrition following gastrointestinal surgery   . Morbid obesity (HCC)   . Seasonal allergies   . SUI (stress urinary incontinence, female) 06-17-13   no longer an issue s/p bladder sling    Past Surgical History:  Procedure Laterality Date  . BLADDER SUSPENSION  04/17/2012   Procedure: [facility] PROCEDURE;  Surgeon: Martina Sinner, MD;  Location: [facility];  Service: Urology;  Laterality: N/A;  . CHOLECYSTECTOMY  1991  . HIATAL HERNIA REPAIR N/A 06/24/2013   Procedure: LAPAROSCOPIC REPAIR OF HIATAL HERNIA;  Surgeon: Atilano Ina, MD;  Location: [facility];  Service: General;  Laterality: N/A;  . LAPAROSCOPIC GASTRIC SLEEVE RESECTION N/A 06/24/2013   Procedure: LAPAROSCOPIC GASTRIC SLEEVE RESECTION;  Surgeon: Atilano Ina, MD;  Location: [facility];  Service: General;  Laterality: N/A;  . TUBAL LIGATION  1991  . UPPER GI ENDOSCOPY N/A 06/24/2013   Procedure: UPPER GI ENDOSCOPY;  Surgeon: Atilano Ina, MD;  Location: [facility];  Service: General;  Laterality: N/A;    Family History  Problem Relation Age  of Onset  . Colon cancer Neg Hx   . Esophageal cancer Neg Hx   . Stomach cancer Neg Hx   . Rectal cancer Neg Hx   . Breast cancer Neg Hx    Social History:  reports that she quit smoking about 6 months ago. Her smoking use included cigarettes. She has a 10.00 pack-year smoking history. She has never used smokeless tobacco. She reports that she does not drink alcohol or use drugs.  Allergies:   Allergies  Allergen Reactions  . Avelox [Moxifloxacin Hcl In Nacl] Other (See Comments)    Causes throat and tongue to swell  . Doxycycline Other (See Comments)    GI UPSET  . Vicodin [Hydrocodone-Acetaminophen] Other (See Comments)    GI UPSET    Medications Prior to Admission  Medication Sig Dispense Refill  . dexlansoprazole (DEXILANT) 60 MG capsule Take 60 mg by mouth daily before breakfast.     . fexofenadine (ALLEGRA) 30 MG tablet Take 30 mg by mouth 2 (two) times daily as needed (allergies.).     Marland Kitchen ibuprofen (ADVIL) 200 MG tablet Take 200-400 mg by mouth every 8 (eight) hours as needed (pain.).    Marland Kitchen Multiple Vitamin (MULTIVITAMIN WITH MINERALS) TABS tablet Take 1 tablet by mouth at bedtime.    . docusate sodium (COLACE) 100 MG capsule Take 1 capsule (100 mg total) by mouth 2 (two) times daily. (Patient not taking: Reported on 01/03/2020) 10 capsule 0  . promethazine (PHENERGAN) 25 MG tablet Take 1 tablet (25 mg total) by mouth every 8 (eight) hours as needed for nausea or vomiting. (Patient not taking: Reported on 01/03/2020) 20 tablet 0    Results for orders placed or performed during the hospital encounter of 01/21/20 (from the past 48 hour(s))  Pregnancy, urine     Status: None   Collection Time: 01/21/20  5:52 AM  Result Value Ref Range   Preg Test, Ur NEGATIVE NEGATIVE    Comment:        THE SENSITIVITY OF THIS METHODOLOGY IS >20 mIU/mL. Performed at [facility], [facility] [address], [place], [place] [address]    No results found.  Review of Systems  All other systems reviewed and are negative.   Blood pressure 130/78, pulse 72, temperature 97.7 F (36.5 C), temperature source Oral, resp. rate 16, height 5\' 5"  (1.651 m), weight 114.7 kg, last menstrual period 01/21/2019, SpO2 96 %. Physical Exam  Vitals reviewed. Constitutional: She is oriented to person, place, and time. She appears well-developed and well-nourished. No distress.  HENT:  Head:  Normocephalic and atraumatic.  Right Ear: External ear normal.  Left Ear: External ear normal.  Eyes: Conjunctivae are normal. No scleral icterus.  Neck: No tracheal deviation present. No thyromegaly present.  Cardiovascular: Normal rate and normal heart sounds.  Respiratory: Effort normal and breath sounds normal. No stridor. No respiratory distress. She has no wheezes.  GI: Soft. She exhibits no distension. There is no abdominal tenderness. There is no rebound.  Old trocar scars  Musculoskeletal:        General: No tenderness or edema.     Cervical back: Normal range of motion and neck supple.  Lymphadenopathy:    She has no cervical adenopathy.  Neurological: She is alert and oriented to person, place, and time. She exhibits normal muscle tone.  Skin: Skin is warm and dry. No rash noted. She is not diaphoretic. No erythema. No pallor.  Psychiatric: She has a normal mood and affect.  Her behavior is normal. Judgment and thought content normal.     Assessment/Plan Severe obesity Uncontrolled GERD H/o sleeve gastrectomy Hypercholesterolemia  To or for conversion to gastric bypass for refractory gerd, poss recurrent HHR All questions asked and answered IV abx ERAS  Mary Sella. Andrey Campanile, MD, FACS General, Bariatric, & Minimally Invasive Surgery [facility], [place]   Gaynelle Adu, MD 01/21/2020, 7:23 AM

## 2020-01-21 NOTE — Transfer of Care (Signed)
Immediate Anesthesia Transfer of Care Note  Patient: Brenda Ware  Procedure(s) Performed: Conversion from Lap Gastric Sleeve to LAPAROSCOPIC ROUX-EN-Y GASTRIC BYPASS WITH UPPER ENDOSCOPY, ERAS Pathway (N/A Abdomen)  Patient Location: PACU  Anesthesia Type:General  Level of Consciousness: awake, alert  and oriented  Airway & Oxygen Therapy: Patient Spontanous Breathing and Patient connected to face mask oxygen  Post-op Assessment: Report given to RN and Post -op Vital signs reviewed and stable  Post vital signs: Reviewed and stable  Last Vitals:  Vitals Value Taken Time  BP 122/93 01/21/20 1023  Temp    Pulse 66 01/21/20 1025  Resp 18 01/21/20 1025  SpO2 100 % 01/21/20 1025  Vitals shown include unvalidated device data.  Last Pain:  Vitals:   01/21/20 0629  TempSrc: Oral  PainSc:       Patients Stated Pain Goal: 4 (01/21/20 6816)  Complications: No apparent anesthesia complications

## 2020-01-21 NOTE — Progress Notes (Signed)
Pt drinking 2 oz at time requesting the Ensure. I cautioned her to be slowly sipping and she agreed. Has not yet opened it, but it is sitting on ice in her room. She will notify staff when starts drinking.

## 2020-01-21 NOTE — Anesthesia Procedure Notes (Signed)
Procedure Name: Intubation Date/Time: 01/21/2020 7:36 AM Performed by: Maxwell Caul, CRNA Pre-anesthesia Checklist: Patient identified, Emergency Drugs available, Suction available and Patient being monitored Patient Re-evaluated:Patient Re-evaluated prior to induction Oxygen Delivery Method: Circle system utilized Preoxygenation: Pre-oxygenation with 100% oxygen Induction Type: IV induction, Rapid sequence and Cricoid Pressure applied Laryngoscope Size: Mac and 4 Grade View: Grade I Tube type: Oral Tube size: 7.5 mm Number of attempts: 1 Airway Equipment and Method: Stylet Placement Confirmation: ETT inserted through vocal cords under direct vision,  positive ETCO2 and breath sounds checked- equal and bilateral Secured at: 22 cm Tube secured with: Tape Dental Injury: Teeth and Oropharynx as per pre-operative assessment

## 2020-01-21 NOTE — Progress Notes (Signed)
NUTRITION NOTE  Consult received for Bariatric Diet teaching per DROP protocol. Patient is POD #0 lap conversion of sleeve gastrectomy to Roux-en-Y gastric bypass and upper endoscopy.   Bariatric Nurse Educator to provide all post-op/pre-discharge information, including diet-related information.  If additional nutrition-related needs arise, please re-consult RD.     Trenton Gammon, MS, RD, LDN, CNSC Inpatient Clinical Dietitian RD pager # available in [facility]  After hours/weekend pager # available in [facility]

## 2020-01-21 NOTE — Op Note (Signed)
Brenda Ware [id number] 1966/03/16. 01/21/2020  Preoperative diagnosis:    Severe Obesity BMI 42   Hyperlipidemia  Refractory GERD   h/o laparoscopic sleeve gastrectomy and hiatal hernia repair 2014  Postoperative  diagnosis:  1. same  Surgical procedure: Laparoscopic conversion of sleeve gastrectomy to  Roux-en-Y gastric bypass (ante-colic, ante-gastric); upper endoscopy  Surgeon: Gayland Curry, M.D. FACS  Asst.: Gurney Maxin MD FACS (an assistant was requested and needed to retract and help identify anatomy given the complexity of the case)  Anesthesia: General plus exparel/marcaine mix  Complications: None   EBL: Minimal   Drains: None   Disposition: PACU in good condition   Indications for procedure: 54 y.o. yo female with morbid obesity previously underwent a sleeve gastrectomy with hiatal hernia repair in 2014 who developed refractory GERD not controllable with medication.  Because we had exhausted nonsurgical options we discussed revision/conversion to a Roux-en-Y gastric bypass.  The patient's comorbidities are listed above. We discussed the risk and benefits of surgery including but not limited to anesthesia risk, bleeding, infection, blood clot formation, anastomotic leak, anastomotic stricture, ulcer formation, death, respiratory complications, intestinal blockage, internal hernia, gallstone formation, vitamin and nutritional deficiencies, injury to surrounding structures, failure to lose weight and mood changes.   Description of procedure: Patient is brought to the operating room and general anesthesia induced. The patient had received preoperative broad-spectrum IV antibiotics and subcutaneous heparin. The abdomen was widely sterilely prepped with Chloraprep and draped. Patient timeout was performed and correct patient and procedure confirmed. Access was obtained with a 12 mm Optiview trocar in the left upper quadrant and pneumoperitoneum established without  difficulty. Under direct vision 12 mm trocars were placed laterally in the right upper quadrant, right upper quadrant midclavicular line, and to the left and above the umbilicus for the camera port. A 5 mm trocar was placed laterally in the left upper quadrant.  Exparel/marcaine mix was infiltrated in bilateral lateral abdominal walls as a TAP block.   I decided to inspect the sleeve gastrectomy first and to create our gastric pouch. Through a 5 mm subxiphoid site the [facility] retractor was placed and the left lobe of the liver elevated after taking down some adhesions from the anterior mid body of the sleeve to the undersurface of the left lobe of the liver with a combination of harmonic scalpel along with EndoShears without electrocautery with excellent exposure of the sleeve and hiatus.  There was a concern of a potential recurrent small sliding hiatal hernia on upper GI imaging.  I did not see any overt evidence of hiatal hernia.  There was no anterior dimpling.  We did incise the gastrohepatic ligament but there was a fair amount of perigastric fat tissue and really could not identify the right crus of the diaphragm.  I decided to have anesthesia obtain a calibration tube with an inflatable balloon on the end of it.  It was placed in the oropharynx and guided down into the sleeve gastrectomy.  The balloon on the and was inflated with 10 cc of air.  We then had the CRNA pulled back on the tube with the inflated balloon.  It met resistance at the diaphragm.  It did not slide back up into the mediastinum.  This told me that there is not a clinically significant defect at the hiatus.  The tube was deflated and withdrawn.  I took down some omentum that had adhered along the left side of the sleeve gastrectomy along the greater curvature of  the sleeve.  We then measured 5 cm from the GE junction with a ruler.  We identified the location on the lesser curve of the sleeve.  Then using harmonic scalpel I was able  to open up the space and get in the retrogastric space with the aid of my assistant retracting the stomach. I was able to pass a blunt instrument retrosleeve where it came out on the greater curvature on the sleeve.  We remeasured and this was again approximately 5 cm from the GE junction.  The sleeve in this location appeared healthy.  There did not appear to be any abnormally thick tissue.  Divided the sleeve at this location with a 6 cm gold load of the Echelon Ethicon stapler.  There was about 2 mm of intact stomach that had not been transected so I divided this with a blue load.  This completely separated the stomach.  The staple line appeared intact.  I then decided to go below and work on the jejunojejunostomy.  The omentum on the left side of the abdomen was stuck to the sigmoid colon.  I decided to just divide the omentum in half in the midportion with harmonic scalpel all the way up to the transverse colon was done with the aid of my assistant retracting on 1 side of the veil of the omentum  A 40 cm biliopancreatic limb was then carefully measured from the ligament of Treitz. The small intestine was divided at this point with a single firing of the white load linear stapler. A Penrose drain was sutured to the end of the Roux-en-Y limb for later identification. A 100 cm Roux-en-Y limb was then carefully measured. At this point a side-to-side anastomosis was created between the Roux limb and the end of the biliopancreatic limb with the aid of the assistant holding the bowel in place. This was accomplished with a single firing of the 60 mm white load linear stapler. The common enterotomy was closed with a running 2-0 Vicryl begun at either end of the enterotomy and tied centrally. Eviceal tissue sealant was placed over the anastomosis. The mesenteric defect was then closed with running 2-0 silk. The patient was then placed in steep reversed Trendelenburg.  The Roux limb was then brought up in an antecolic  fashion with the candycane facing to the patient's left without undue tension. The gastrojejunostomy was created with an initial posterior row of 2-0 Vicryl between the Roux limb and the staple line of the gastric pouch. Enterotomies were then made in the gastric pouch and the Roux limb with the harmonic scalpel and at approximately 2-2-1/2 cm anastomosis was created with a single firing of the 87m blue load linear stapler. The staple line was inspected and was intact without bleeding. The common enterotomy was then closed with running 2-0 Vicryl begun at either end and tied centrally. The Ewall tube was then easily passed through the anastomosis and an outer anterior layer of running 2-0 Vicryl was placed. The Ewald tube was removed. With the outlet of the gastrojejunostomy clamped and under saline irrigation the assistant performed upper endoscopy and with the gastric pouch tensely distended with air-there was no evidence of leak on this test. The pouch was desufflated. The PTerance Hartdefect was closed with running 2-0 silk.  I did place 2 additional interrupted 2-0 Vicryl sutures along the anterior gastrojejunal anastomosis. the abdomen was inspected for any evidence of bleeding or bowel injury and everything looked fine. The Nathanson retractor was removed under direct  vision after coating the anastomosis with Eviceal tissue sealant. All CO2 was evacuated and trochars removed. Skin incisions were closed with 4-0 monocryl in a subcuticular fashion followed by benzoin, steri-strips and bandages. Sponge needle and instrument counts were correct. The patient was taken to the PACU in good condition.    Leighton Ruff. Redmond Pulling, MD, FACS General, Bariatric, & Minimally Invasive Surgery [facility], [place]

## 2020-01-21 NOTE — Op Note (Signed)
Preoperative diagnosis: Roux-en-Y gastric bypass  Postoperative diagnosis: Same   Procedure: Upper endoscopy   Surgeon: Feliciana Rossetti, M.D.  Anesthesia: Gen.   Indications for procedure: This patient was undergoing a Roux-en-Y gastric bypass.   Description of procedure: The endoscopy was placed in the mouth and into the oropharynx and under endoscopic vision it was advanced to the esophagogastric junction. The pouch was insufflated and no bleeding or bubbles were seen. The GEJ was identified at 37 cm from the teeth. The anastomosis was seen at 44 cm for a 6-7 cm pouch. No bleeding or leaks were detected. The scope was withdrawn without difficulty.   Feliciana Rossetti, M.D. General, Bariatric, & Minimally Invasive Surgery [facility] [place]

## 2020-01-21 NOTE — Anesthesia Postprocedure Evaluation (Signed)
Anesthesia Post Note  Patient: MAURIANA DANN  Procedure(s) Performed: Conversion from Lap Gastric Sleeve to LAPAROSCOPIC ROUX-EN-Y GASTRIC BYPASS WITH UPPER ENDOSCOPY, ERAS Pathway (N/A Abdomen)     Patient location during evaluation: PACU Anesthesia Type: General Level of consciousness: sedated Pain management: pain level controlled Vital Signs Assessment: post-procedure vital signs reviewed and stable Respiratory status: spontaneous breathing and respiratory function stable Cardiovascular status: stable Postop Assessment: no apparent nausea or vomiting Anesthetic complications: no    Last Vitals:  Vitals:   01/21/20 1215 01/21/20 1228  BP: (!) 151/97 116/80  Pulse: (!) 58 65  Resp: 13 12  Temp: (!) 36.3 C (!) 36.3 C  SpO2: 100% 99%    Last Pain:  Vitals:   01/21/20 1228  TempSrc: Oral  PainSc: 5                  Altha Sweitzer DANIEL

## 2020-01-21 NOTE — Discharge Instructions (Signed)
° ° ° °GASTRIC BYPASS/SLEEVE ° Home Care Instructions ° ° These instructions are to help you care for yourself when you go home. ° °Call: If you have any problems. °• Call [phone] and ask for the surgeon on call °• If you need immediate help, come to the ER at [place].  °• Tell the ER staff that you are a new post-op gastric bypass or gastric sleeve patient °  °Signs and symptoms to report: • Severe vomiting or nausea °o If you cannot keep down clear liquids for longer than 1 day, call your surgeon  °• Abdominal pain that does not get better after taking your pain medication °• Fever over 100.4° F with chills °• Heart beating over 100 beats a minute °• Shortness of breath at rest °• Chest pain °•  Redness, swelling, drainage, or foul odor at incision (surgical) sites °•  If your incisions open or pull apart °• Swelling or pain in calf (lower leg) °• Diarrhea (Loose bowel movements that happen often), frequent watery, uncontrolled bowel movements °• Constipation, (no bowel movements for 3 days) if this happens: Pick one °o Milk of Magnesia, 2 tablespoons by mouth, 3 times a day for 2 days if needed °o Stop taking Milk of Magnesia once you have a bowel movement °o Call your doctor if constipation continues °Or °o Miralax  (instead of Milk of Magnesia) following the label instructions °o Stop taking Miralax once you have a bowel movement °o Call your doctor if constipation continues °• Anything you think is not normal °  °Normal side effects after surgery: • Unable to sleep at night or unable to focus °• Irritability or moody °• Being tearful (crying) or depressed °These are common complaints, possibly related to your anesthesia medications that put you to sleep, stress of surgery, and change in lifestyle.  This usually goes away a few weeks after surgery.  If these feelings continue, call your primary care doctor. °  °Wound Care: You may have surgical glue, steri-strips, or staples over your incisions after  surgery °• Surgical glue:  Looks like a clear film over your incisions and will wear off a little at a time °• Steri-strips: Strips of tape over your incisions. You may notice a yellowish color on the skin under the steri-strips. This is used to make the   steri-strips stick better. Do not pull the steri-strips off - let them fall off °• Staples: Staples may be removed before you leave the hospital °o If you go home with staples, call [facility], [phone] at for an appointment with your surgeon’s nurse to have staples removed 10 days after surgery. °• Showering: You may shower two (2) days after your surgery unless your surgeon tells you differently °o Wash gently around incisions with warm soapy water, rinse well, and gently pat dry  °o No tub baths until staples are removed, steri-strips fall off or glue is gone.  °  °Medications: • Medications should be liquid or crushed if larger than the size of a dime °• Extended release pills (medication that release a little bit at a time through the day) should NOT be crushed or cut. (examples include XL, ER, DR, SR) °• Depending on the size and number of medications you take, you may need to space (take a few throughout the day)/change the time you take your medications so that you do not over-fill your pouch (smaller stomach) °• Make sure you follow-up with your primary care doctor to   make medication changes needed during rapid weight loss and life-style changes °• If you have diabetes, follow up with the doctor that orders your diabetes medication(s) within one week after surgery and check your blood sugar regularly. °• Do not drive while taking prescription pain medication  °• It is ok to take Tylenol by the bottle instructions with your pain medicine or instead of your pain medicine as needed.  DO NOT TAKE NSAIDS (EXAMPLES OF NSAIDS:  IBUPROFREN/ NAPROXEN)  °Diet:                    First 2 Weeks ° You will see the dietician t about two (2) weeks  after your surgery. The dietician will increase the types of foods you can eat if you are handling liquids well: °• If you have severe vomiting or nausea and cannot keep down clear liquids lasting longer than 1 day, call your surgeon @ ([phone]) °Protein Shake °• Drink at least 2 ounces of shake 5-6 times per day °• Each serving of protein shakes (usually 8 - 12 ounces) should have: °o 15 grams of protein  °o And no more than 5 grams of carbohydrate  °• Goal for protein each day: °o Men = 80 grams per day °o Women = 60 grams per day °• Protein powder may be added to fluids such as non-fat milk or Lactaid milk or unsweetened Soy/Almond milk (limit to 35 grams added protein powder per serving) ° °Hydration °• Slowly increase the amount of water and other clear liquids as tolerated (See Acceptable Fluids) °• Slowly increase the amount of protein shake as tolerated  °•  Sip fluids slowly and throughout the day.  Do not use straws. °• May use sugar substitutes in small amounts (no more than 6 - 8 packets per day; i.e. Splenda) ° °Fluid Goal °• The first goal is to drink at least 8 ounces of protein shake/drink per day (or as directed by the nutritionist); some examples of protein shakes are Syntrax Nectar, Adkins Advantage, EAS Edge HP, and Unjury. See handout from pre-op Bariatric Education Class: °o Slowly increase the amount of protein shake you drink as tolerated °o You may find it easier to slowly sip shakes throughout the day °o It is important to get your proteins in first °• Your fluid goal is to drink 64 - 100 ounces of fluid daily °o It may take a few weeks to build up to this °• 32 oz (or more) should be clear liquids  °And  °• 32 oz (or more) should be full liquids (see below for examples) °• Liquids should not contain sugar, caffeine, or carbonation ° °Clear Liquids: °• Water or Sugar-free flavored water (i.e. Fruit H2O, Propel) °• Decaffeinated coffee or tea (sugar-free) °• Crystal Lite, Wyler’s Lite,  Minute Maid Lite °• Sugar-free Jell-O °• Bouillon or broth °• Sugar-free Popsicle:   *Less than 20 calories each; Limit 1 per day ° °Full Liquids: °Protein Shakes/Drinks + 2 choices per day of other full liquids °• Full liquids must be: °o No More Than 15 grams of Carbs per serving  °o No More Than 3 grams of Fat per serving °• Strained low-fat cream soup (except Cream of Potato or Tomato) °• Non-Fat milk °• Fat-free Lactaid Milk °• Unsweetened Soy Or Unsweetened Almond Milk °• Low Sugar yogurt (Dannon Lite & Fit, Greek yogurt; Oikos Triple Zero; Chobani Simply 100; Yoplait 100 calorie Greek - No Fruit on the Bottom) ° °  °Vitamins   and Minerals • Start 1 day after surgery unless otherwise directed by your surgeon °• 2 Chewable Bariatric Specific Multivitamin / Multimineral Supplement with iron (Example: Bariatric Advantage Multi EA) °• Chewable Calcium with Vitamin D-3 °(Example: 3 Chewable Calcium Plus 600 with Vitamin D-3) °o Take 500 mg three (3) times a day for a total of 1500 mg each day °o Do not take all 3 doses of calcium at one time as it may cause constipation, and you can only absorb 500 mg  at a time  °o Do not mix multivitamins containing iron with calcium supplements; take 2 hours apart °• Menstruating women and those with a history of anemia (a blood disease that causes weakness) may need extra iron °o Talk with your doctor to see if you need more iron °• Do not stop taking or change any vitamins or minerals until you talk to your dietitian or surgeon °• Your Dietitian and/or surgeon must approve all vitamin and mineral supplements °  °Activity and Exercise: Limit your physical activity as instructed by your doctor.  It is important to continue walking at home.  During this time, use these guidelines: °• Do not lift anything greater than ten (10) pounds for at least two (2) weeks °• Do not go back to work or drive until your surgeon says you can °• You may have sex when you feel comfortable  °o It is  VERY important for female patients to use a reliable birth control method; fertility often increases after surgery  °o All hormonal birth control will be ineffective for 30 days after surgery due to medications given during surgery a barrier method must be used. °o Do not get pregnant for at least 18 months °• Start exercising as soon as your doctor tells you that you can °o Make sure your doctor approves any physical activity °• Start with a simple walking program °• Walk 5-15 minutes each day, 7 days per week.  °• Slowly increase until you are walking 30-45 minutes per day °Consider joining our BELT program. [phone] or email [email or web address] °  °Special Instructions Things to remember: °• Use your CPAP when sleeping if this applies to you ° °• [facility] has two free Bariatric Surgery Support Groups that meet monthly °o The 3rd Thursday of each month, 6 pm, [facility]  °o The 2nd Friday of each month, 11:45 am in the private dining room in the basement of [place] °• It is very important to keep all follow up appointments with your surgeon, dietitian, primary care physician, and behavioral health practitioner °• Routine follow up schedule with your surgeon include appointments at 2-3 weeks, 6-8 weeks, 6 months, and 1 year at a minimum.  Your surgeon may request to see you more often.   °o After the first year, please follow up with your bariatric surgeon and dietitian at least once a year in order to maintain best weight loss results °[facility]: [phone] °[facility]: [phone] °Bariatric Nurse Coordinator: [phone] °  °   Reviewed and Endorsed  °by [facility] Patient Education Committee, June, 2016 °Edits Approved: Aug, 2018 ° ° ° °

## 2020-01-21 NOTE — Progress Notes (Signed)
PHARMACY CONSULT FOR:  Risk Assessment for Post-Discharge VTE Following Bariatric Surgery  Post-Discharge VTE Risk Assessment: This patient's probability of 30-day post-discharge VTE is increased due to the factors marked:   Female    Age >/=60 years    BMI >/=50 kg/m2    CHF    Dyspnea at Rest    Paraplegia  X  Non-gastric-band surgery    Operation Time >/=3 hr    Return to OR     Length of Stay >/= 3 d      Hx of VTE   Hypercoagulable condition   Significant venous stasis   Predicted probability of 30-day post-discharge VTE: 0.16%  Other patient-specific factors to consider: none   Recommendation for Discharge: . No pharmacologic prophylaxis post-discharge . Follow daily and recalculate estimated 30d VTE risk if returns to OR or LOS reaches 3 days.   Brenda Ware is a 54 y.o. 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 Avelox [Moxifloxacin Hcl In Nacl] Other (See Comments)    Causes throat and tongue to swell  . Doxycycline Other (See Comments)    GI UPSET  . Vicodin [Hydrocodone-Acetaminophen] Other (See Comments)    GI UPSET    Patient Measurements: Height: 5\' 5"  (165.1 cm) Weight: 114.7 kg (252 lb 12.8 oz) IBW/kg (Calculated) : 57 Body mass index is 42.07 kg/m.  No results for input(s): WBC, HGB, HCT, PLT, APTT, CREATININE, LABCREA, CREATININE, CREAT24HRUR, MG, PHOS, ALBUMIN, PROT, ALBUMIN, AST, ALT, ALKPHOS, BILITOT, BILIDIR, IBILI in the last 72 hours. Estimated Creatinine Clearance: 101.7 mL/min (by C-G formula based on SCr of 0.72 mg/dL).    Past Medical History:  Diagnosis Date  . GERD (gastroesophageal reflux disease) 06-17-13   no problems in several months  . 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 Malnutrition following  gastrointestinal surgery   . Morbid obesity (HCC)   . Seasonal allergies   . SUI (stress urinary incontinence, female) 06-17-13   no longer an issue s/p bladder sling     Medications Prior to Admission  Medication Sig Dispense Refill Last Dose  . dexlansoprazole (DEXILANT) 60 MG capsule Take 60 mg by mouth daily before breakfast.    01/21/2020 at 0445  . fexofenadine (ALLEGRA) 30 MG tablet Take 30 mg by mouth 2 (two) times daily as needed (allergies.).    Past Month at Unknown time  . ibuprofen (ADVIL) 200 MG tablet Take 200-400 mg by mouth every 8 (eight) hours as needed (pain.).   Past Month at Unknown time  . Multiple Vitamin (MULTIVITAMIN WITH MINERALS) TABS tablet Take 1 tablet by mouth at bedtime.   01/20/2020 at Unknown time  . docusate sodium (COLACE) 100 MG capsule Take 1 capsule (100 mg total) by mouth 2 (two) times daily. (Patient not taking: Reported on 01/03/2020) 10 capsule 0 Not Taking at Unknown time  . promethazine (PHENERGAN) 25 MG tablet Take 1 tablet (25 mg total) by mouth every 8 (eight) hours as needed for nausea or vomiting. (Patient not taking: Reported on 01/03/2020) 20 tablet 0 Not Taking at Unknown time      03/04/2020, PharmD, BCPS [phone] 01/21/2020, 12:31 PM

## 2020-01-21 NOTE — Progress Notes (Signed)
Discussed post op day goals with patient including ambulation, IS, diet progression, pain, and nausea control.  BSTOP education provided including BSTOP information guide, "Guide for Pain Management after your Bariatric Procedure".  Questions answered. 

## 2020-01-21 NOTE — Plan of Care (Signed)
Started care plan

## 2020-01-22 LAB — COMPREHENSIVE METABOLIC PANEL
ALT: 46 U/L — ABNORMAL HIGH (ref 0–44)
AST: 46 U/L — ABNORMAL HIGH (ref 15–41)
Albumin: 3.4 g/dL — ABNORMAL LOW (ref 3.5–5.0)
Alkaline Phosphatase: 59 U/L (ref 38–126)
Anion gap: 10 (ref 5–15)
BUN: 13 mg/dL (ref 6–20)
CO2: 22 mmol/L (ref 22–32)
Calcium: 8.8 mg/dL — ABNORMAL LOW (ref 8.9–10.3)
Chloride: 107 mmol/L (ref 98–111)
Creatinine, Ser: 0.89 mg/dL (ref 0.44–1.00)
GFR calc Af Amer: >60 mL/min (ref >60)
GFR calc non Af Amer: >60 mL/min (ref >60)
Glucose, Bld: 136 mg/dL — ABNORMAL HIGH (ref 70–99)
Potassium: 4.3 mmol/L (ref 3.5–5.1)
Sodium: 139 mmol/L (ref 135–145)
Total Bilirubin: 0.2 mg/dL — ABNORMAL LOW (ref 0.3–1.2)
Total Protein: 7 g/dL (ref 6.5–8.1)

## 2020-01-22 LAB — CBC WITH DIFFERENTIAL/PLATELET
Abs Immature Granulocytes: 0.03 10*3/uL (ref 0.00–0.07)
Basophils Absolute: 0 10*3/uL (ref 0.0–0.1)
Basophils Relative: 0 %
Eosinophils Absolute: 0 10*3/uL (ref 0.0–0.5)
Eosinophils Relative: 0 %
HCT: 38.5 % (ref 36.0–46.0)
Hemoglobin: 12.1 g/dL (ref 12.0–15.0)
Immature Granulocytes: 0 %
Lymphocytes Relative: 15 %
Lymphs Abs: 1.6 10*3/uL (ref 0.7–4.0)
MCH: 29.2 pg (ref 26.0–34.0)
MCHC: 31.4 g/dL (ref 30.0–36.0)
MCV: 92.8 fL (ref 80.0–100.0)
Monocytes Absolute: 0.6 10*3/uL (ref 0.1–1.0)
Monocytes Relative: 6 %
Neutro Abs: 8.5 10*3/uL — ABNORMAL HIGH (ref 1.7–7.7)
Neutrophils Relative %: 79 %
Platelets: 344 10*3/uL (ref 150–400)
RBC: 4.15 MIL/uL (ref 3.87–5.11)
RDW: 12.4 % (ref 11.5–15.5)
WBC: 10.7 10*3/uL — ABNORMAL HIGH (ref 4.0–10.5)
nRBC: 0 % (ref 0.0–0.2)

## 2020-01-22 MED ORDER — TRAMADOL HCL 50 MG PO TABS: 50.0000 mg | ORAL_TABLET | Freq: Four times a day (QID) | ORAL | 0 refills | Status: DC | PRN

## 2020-01-22 MED ORDER — GABAPENTIN 100 MG PO CAPS
200.0000 mg | ORAL_CAPSULE | Freq: Two times a day (BID) | ORAL | 0 refills | Status: DC
Start: 2020-01-22 — End: 2020-02-27

## 2020-01-22 MED ORDER — ONDANSETRON 4 MG PO TBDP
4.0000 mg | ORAL_TABLET | Freq: Four times a day (QID) | ORAL | 0 refills | Status: DC | PRN
Start: 2020-01-22 — End: 2020-02-27

## 2020-01-22 MED ORDER — ACETAMINOPHEN 500 MG PO TABS: 1000.0000 mg | ORAL_TABLET | Freq: Three times a day (TID) | ORAL | 0 refills | Status: AC

## 2020-01-22 NOTE — Progress Notes (Addendum)
Patient has finished water goal and is on 2nd cup of protein. Patient states she walked around the unit this morning. She is demonstrating IS use and has c/o nausea only. Zofran given. Pain controlled.

## 2020-01-22 NOTE — Plan of Care (Signed)
Pt was discharged home today. Instructions were reviewed with patient, and questions were answered. Pt was taken to main entrance via wheelchair by NT.  

## 2020-01-22 NOTE — Progress Notes (Signed)
Patient alert and oriented, Post op day 1.  Provided support and encouragement.  Encouraged pulmonary toilet, ambulation and small sips of liquids.  All questions answered.  Will continue to monitor. 

## 2020-01-22 NOTE — Progress Notes (Signed)
Patient refused gabapentin at this time.

## 2020-01-22 NOTE — Progress Notes (Signed)
Patient alert and oriented, pain is controlled. Patient is tolerating fluids, advanced to protein shake today, patient is tolerating well. Reviewed Gastric Bypass discharge instructions with patient and patient is able to articulate understanding. Provided information on BELT program, Support Group and [facility] outpatient pharmacy. All questions answered, will continue to monitor.   Total fluid intake 1030 Per dehydration protocol call back one week post op

## 2020-01-24 NOTE — Discharge Summary (Signed)
Physician Discharge Summary  Brenda Ware NWG:[id number] DOB: 1966/05/30 DOA: 01/21/2020  PCP: Myrene Buddy, MD  Admit date: 01/21/2020 Discharge date: 01/22/2020  Recommendations for Outpatient Follow-up:    Follow-up Information    Gaynelle Adu, MD. Go on 02/19/2020.   Specialty: General Surgery Why: at [address] information: [address] [place] [address] [phone]        Hedda Slade, PA-C. Go on 03/19/2020.   Specialty: General Surgery Why: at 915 am Contact information: [address] [place] [address] [phone]          Discharge Diagnoses:  Principal Problem:   Obesity, Class III, BMI 40-49.9 (morbid obesity) (HCC) Active Problems:   Hyperlipidemia   h/o laparoscopic sleeve gastrectomy and hiatal hernia repair Refractory GERD  Surgical Procedure: Laparoscopic conversion of sleeve gastrectomy to Laparoscopic Roux-en-Y gastric bypass, upper endoscopy  Discharge Condition: Good Disposition: Home  Diet recommendation: Postoperative gastric bypass diet  Filed Weights   01/21/20 0629  Weight: 114.7 kg     Hospital Course:  The patient was admitted for a planned conversion of sleeve gastrectomy to laparoscopic Roux-en-Y gastric bypass for refractory gerd. Please see operative note. Preoperatively the patient was given 5000 units of subcutaneous heparin for DVT prophylaxis. ERAS protocol was used. Postoperative prophylactic Lovenox dosing was started on the evening of postoperative day 0.  The patient was started on ice chips and water on the evening of POD 0 which they tolerated. On postoperative day 1 The patient's diet was advanced to protein shakes which they also tolerated. On POD 1, The patient was ambulating without difficulty. Their vital signs are stable without fever or tachycardia. Their hemoglobin had remained stable.  The patient had received discharge instructions and counseling. They were deemed stable for  discharge.  BP 90/75 (BP Location: Right Arm)   Pulse 66   Temp 98.1 F (36.7 C) (Oral)   Resp 15   Ht 5\' 5"  (1.651 m)   Wt 114.7 kg   LMP 01/21/2019 (Approximate)   SpO2 97%   BMI 42.07 kg/m   Gen: alert, NAD, non-toxic appearing Pupils: equal, no scleral icterus Pulm: Lungs clear to auscultation, symmetric chest rise CV: regular rate and rhythm Abd: soft, min tender, nondistended. No cellulitis. No incisional hernia Ext: no edema, no calf tenderness Skin: no rash, no jaundice  Discharge Instructions  Discharge Instructions    Ambulate hourly while awake   Complete by: As directed    Call MD for:  difficulty breathing, headache or visual disturbances   Complete by: As directed    Call MD for:  persistant dizziness or light-headedness   Complete by: As directed    Call MD for:  persistant nausea and vomiting   Complete by: As directed    Call MD for:  redness, tenderness, or signs of infection (pain, swelling, redness, odor or green/yellow discharge around incision site)   Complete by: As directed    Call MD for:  severe uncontrolled pain   Complete by: As directed    Call MD for:  temperature >101 F   Complete by: As directed    Diet bariatric full liquid   Complete by: As directed    Discharge instructions   Complete by: As directed    See bariatric discharge instructions   Incentive spirometry   Complete by: As directed    Perform hourly while awake     Allergies as of 01/22/2020  Reactions   Avelox [moxifloxacin Hcl In Nacl] Other (See Comments)   Causes throat and tongue to swell   Doxycycline Other (See Comments)   GI UPSET   Vicodin [hydrocodone-acetaminophen] Other (See Comments)   GI UPSET      Medication List    STOP taking these medications   ibuprofen 200 MG tablet Commonly known as: ADVIL   promethazine 25 MG tablet Commonly known as: PHENERGAN     TAKE these medications   acetaminophen 500 MG tablet Commonly known as:  TYLENOL Take 2 tablets (1,000 mg total) by mouth every 8 (eight) hours for 5 days.   Dexilant 60 MG capsule Generic drug: dexlansoprazole Take 60 mg by mouth daily before breakfast.   docusate sodium 100 MG capsule Commonly known as: Colace Take 1 capsule (100 mg total) by mouth 2 (two) times daily.   fexofenadine 30 MG tablet Commonly known as: ALLEGRA Take 30 mg by mouth 2 (two) times daily as needed (allergies.).   gabapentin 100 MG capsule Commonly known as: NEURONTIN Take 2 capsules (200 mg total) by mouth every 12 (twelve) hours.   multivitamin with minerals Tabs tablet Take 1 tablet by mouth at bedtime.   ondansetron 4 MG disintegrating tablet Commonly known as: ZOFRAN-ODT Take 1 tablet (4 mg total) by mouth every 6 (six) hours as needed for nausea or vomiting.   traMADol 50 MG tablet Commonly known as: ULTRAM Take 1 tablet (50 mg total) by mouth every 6 (six) hours as needed (pain).      Follow-up Information    Greer Pickerel, MD. Go on 02/19/2020.   Specialty: General Surgery Why: at [address] information: [address] [place] [address] [phone]        Carlena Hurl, PA-C. Go on 03/19/2020.   Specialty: General Surgery Why: at 915 am Contact information: [place] [address] [phone]            The results of significant diagnostics from this hospitalization (including imaging, microbiology, ancillary and laboratory) are listed below for reference.    Significant Diagnostic Studies: No results found.  Labs: Basic Metabolic Panel: Recent Labs  Lab 01/22/20 0400  NA 139  K 4.3  CL 107  CO2 22  GLUCOSE 136*  BUN 13  CREATININE 0.89  CALCIUM 8.8*   Liver Function Tests: Recent Labs  Lab 01/22/20 0400  AST 46*  ALT 46*  ALKPHOS 59  BILITOT 0.2*  PROT 7.0  ALBUMIN 3.4*    CBC: Recent Labs  Lab 01/21/20 1307 01/22/20 0400  WBC  --  10.7*  NEUTROABS  --  8.5*  HGB 13.4 12.1   HCT 42.0 38.5  MCV  --  92.8  PLT  --  344    CBG: No results for input(s): GLUCAP in the last 168 hours.  Principal Problem:   Obesity, Class III, BMI 40-49.9 (morbid obesity) ([facility]) Active Problems:   Hyperlipidemia   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   Time coordinating discharge: 15 min  Signed:  Gayland Curry, MD [facility], [place] [phone] 01/24/2020, 1:04 PM

## 2020-01-27 ENCOUNTER — Telehealth (HOSPITAL_COMMUNITY): Payer: Self-pay

## 2020-01-27 NOTE — Telephone Encounter (Signed)
Patient called to discuss post bariatric surgery follow up questions.  See below:   1.  Tell me about your pain and pain management?used pain medication several times no issues with pain relief  2.  Let's talk about fluid intake.  How much total fluid are you taking in?40+ ounces of fluid  3.  How much protein have you taken in the last 2 days?4/30-50 grams of protein,   4.  Have you had nausea?  Tell me about when have experienced nausea and what you did to help?nausesated first day home from hospital, no nausea next day.  Used zofran not much help that night but no nausea since  5.  Has the frequency or color changed with your urine?light in color  6.  Tell me what your incisions look like?no problems  7.  Have you been passing gas? BM?BM 01/26/2020 and one 01/27/2020 took miralax last night  8.  If a problem or question were to arise who would you call?  Do you know contact numbers for [facility], [facility], and [facility]?  9.  How has the walking going?no problems walking 5-10 minutes outside  10.  How are your vitamins and calcium going?  How are you taking them?  mvi and calcium not purchased yet encouraged to purchase as soon possible and start

## 2020-02-04 ENCOUNTER — Encounter: Payer: BC Managed Care – PPO | Attending: General Surgery | Admitting: Skilled Nursing Facility1

## 2020-02-04 ENCOUNTER — Other Ambulatory Visit: Payer: Self-pay

## 2020-02-04 DIAGNOSIS — E669 Obesity, unspecified: Secondary | ICD-10-CM | POA: Diagnosis not present

## 2020-02-04 DIAGNOSIS — E6609 Other obesity due to excess calories: Secondary | ICD-10-CM

## 2020-02-05 NOTE — Progress Notes (Signed)
2 Week Post-Operative Nutrition Class   Patient was seen on 11/20/18 for Post-Operative Nutrition education at the [facility].    Surgery date:  Surgery type: RYGB Start weight at [facility]: 251.9 Weight today: pt declined   Body Composition Scale declined  Total Body Fat %   Visceral Fat   Fat-Free Mass %    Total Body Water %    Muscle-Mass lbs   Body Fat Displacement          Torso  lbs          Left Leg  lbs          Right Leg  lbs          Left Arm  lbs          Right Arm   lbs      The following the learning objectives were met by the patient during this course:  Identifies Phase 3 (Soft, High Proteins) Dietary Goals and will begin from 2 weeks post-operatively to 2 months post-operatively  Identifies appropriate sources of fluids and proteins   States protein recommendations and appropriate sources post-operatively  Identifies the need for appropriate texture modifications, mastication, and bite sizes when consuming solids  Identifies appropriate multivitamin and calcium sources post-operatively  Describes the need for physical activity post-operatively and will follow MD recommendations  States when to call healthcare provider regarding medication questions or post-operative complications   Handouts given during class include:  Phase 3A: Soft, High Protein Diet Handout   Follow-Up Plan: Patient will follow-up at [facility] in 6 weeks for 2 month post-op nutrition visit for diet advancement per MD.

## 2020-02-10 ENCOUNTER — Telehealth: Payer: Self-pay | Admitting: Skilled Nursing Facility1

## 2020-02-10 NOTE — Telephone Encounter (Signed)
RD called pt to verify fluid intake once starting soft, solid proteins 2 week post-bariatric surgery.   Daily Fluid intake: 64+  Daily Protein intake: 60+  Concerns/issues:   No concerns  

## 2020-02-20 ENCOUNTER — Emergency Department (HOSPITAL_COMMUNITY): Payer: BC Managed Care – PPO

## 2020-02-20 ENCOUNTER — Emergency Department (HOSPITAL_COMMUNITY)
Admission: EM | Admit: 2020-02-20 | Discharge: 2020-02-20 | Disposition: A | Discharge status: Home / Self Care | Payer: BC Managed Care – PPO | Attending: Emergency Medicine | Admitting: Emergency Medicine

## 2020-02-20 ENCOUNTER — Other Ambulatory Visit: Payer: Self-pay | Admitting: General Surgery

## 2020-02-20 ENCOUNTER — Encounter (HOSPITAL_COMMUNITY): Payer: Self-pay

## 2020-02-20 ENCOUNTER — Other Ambulatory Visit: Payer: Self-pay

## 2020-02-20 DIAGNOSIS — R911 Solitary pulmonary nodule: Secondary | ICD-10-CM

## 2020-02-20 DIAGNOSIS — Z87891 Personal history of nicotine dependence: Secondary | ICD-10-CM | POA: Insufficient documentation

## 2020-02-20 DIAGNOSIS — R0789 Other chest pain: Secondary | ICD-10-CM | POA: Diagnosis present

## 2020-02-20 DIAGNOSIS — J189 Pneumonia, unspecified organism: Secondary | ICD-10-CM | POA: Diagnosis not present

## 2020-02-20 DIAGNOSIS — E0789 Other specified disorders of thyroid: Secondary | ICD-10-CM | POA: Diagnosis not present

## 2020-02-20 DIAGNOSIS — Z79899 Other long term (current) drug therapy: Secondary | ICD-10-CM | POA: Insufficient documentation

## 2020-02-20 LAB — CBC
HCT: 37.4 % (ref 36.0–46.0)
Hemoglobin: 12.3 g/dL (ref 12.0–15.0)
MCH: 28.9 pg (ref 26.0–34.0)
MCHC: 32.9 g/dL (ref 30.0–36.0)
MCV: 88 fL (ref 80.0–100.0)
Platelets: 393 10*3/uL (ref 150–400)
RBC: 4.25 MIL/uL (ref 3.87–5.11)
RDW: 13.6 % (ref 11.5–15.5)
WBC: 9.9 10*3/uL (ref 4.0–10.5)
nRBC: 0 % (ref 0.0–0.2)

## 2020-02-20 LAB — BASIC METABOLIC PANEL
Anion gap: 11 (ref 5–15)
BUN: 16 mg/dL (ref 6–20)
CO2: 24 mmol/L (ref 22–32)
Calcium: 9.2 mg/dL (ref 8.9–10.3)
Chloride: 102 mmol/L (ref 98–111)
Creatinine, Ser: 0.77 mg/dL (ref 0.44–1.00)
GFR calc Af Amer: >60 mL/min (ref >60)
GFR calc non Af Amer: >60 mL/min (ref >60)
Glucose, Bld: 112 mg/dL — ABNORMAL HIGH (ref 70–99)
Potassium: 3.5 mmol/L (ref 3.5–5.1)
Sodium: 137 mmol/L (ref 135–145)

## 2020-02-20 LAB — TROPONIN I (HIGH SENSITIVITY)
Troponin I (High Sensitivity): 3 ng/L (ref <18)
Troponin I (High Sensitivity): 2 ng/L (ref <18)

## 2020-02-20 MED ORDER — OXYCODONE-ACETAMINOPHEN 5-325 MG PO TABS
1.0000 | ORAL_TABLET | Freq: Once | ORAL | Status: AC
  Administered 2020-02-20: 1 via ORAL
  Filled 2020-02-20: qty 1

## 2020-02-20 MED ORDER — AMOXICILLIN-POT CLAVULANATE 400-57 MG/5ML PO SUSR
875.0000 mg | Freq: Two times a day (BID) | ORAL | 0 refills | Status: AC
Start: 2020-02-20 — End: 2020-02-27

## 2020-02-20 MED ORDER — IOHEXOL 350 MG/ML SOLN
100.0000 mL | Freq: Once | INTRAVENOUS | Status: AC | PRN
  Administered 2020-02-20: 100 mL via INTRAVENOUS

## 2020-02-20 MED ORDER — AZITHROMYCIN 200 MG/5ML PO SUSR
250.0000 mg | Freq: Every day | ORAL | 0 refills | Status: AC
Start: 2020-02-21 — End: 2020-02-25

## 2020-02-20 MED ORDER — THIAMINE HCL 100 MG/ML IJ SOLN
Freq: Once | INTRAVENOUS | Status: AC
  Filled 2020-02-20: qty 1000

## 2020-02-20 MED ORDER — AMOXICILLIN-POT CLAVULANATE 875-125 MG PO TABS
1.0000 | ORAL_TABLET | Freq: Once | ORAL | Status: AC
  Administered 2020-02-20: 1 via ORAL
  Filled 2020-02-20: qty 1

## 2020-02-20 MED ORDER — AZITHROMYCIN 250 MG PO TABS
500.0000 mg | ORAL_TABLET | Freq: Once | ORAL | Status: AC
  Administered 2020-02-20: 500 mg via ORAL
  Filled 2020-02-20: qty 2

## 2020-02-20 MED ORDER — OXYCODONE-ACETAMINOPHEN 5-325 MG PO TABS: 1.0000 | ORAL_TABLET | ORAL | 0 refills | Status: DC | PRN

## 2020-02-20 MED ORDER — SODIUM CHLORIDE (PF) 0.9 % IJ SOLN
INTRAMUSCULAR | Status: AC
  Filled 2020-02-20: qty 50

## 2020-02-20 MED ORDER — SODIUM CHLORIDE 0.9 % IV BOLUS
1000.0000 mL | Freq: Once | INTRAVENOUS | Status: AC
  Administered 2020-02-20: 1000 mL via INTRAVENOUS

## 2020-02-20 MED ORDER — HYDROMORPHONE HCL 1 MG/ML IJ SOLN
1.0000 mg | Freq: Once | INTRAMUSCULAR | Status: AC
  Administered 2020-02-20: 1 mg via INTRAVENOUS
  Filled 2020-02-20: qty 1

## 2020-02-20 NOTE — ED Provider Notes (Signed)
[name]-EMERGENCY DEPT Provider Note   CSN: [id number] Arrival date & time: 02/20/20  0056     History Chief Complaint  Patient presents with  . Chest Pain    Brenda Ware is a 54 y.o. female.  HPI 54 year old female presents with left-sided chest pain. Originally started about 4 or 5 days ago. 1 month ago she had gastric bypass. States she has seen her surgeon since because of this chest pain. She states she has had "a virus" over these last several days which she notes consists of cough with occasional sputum and sometimes blood, subjective fever along with chills, and some nausea and vomiting. She feels short of breath when she walks. The left chest pain is sharp and is worse with deep inspiration. Pain is rated as severe, 10/10.   Past Medical History:  Diagnosis Date  . GERD (gastroesophageal reflux disease) 06-17-13   no problems in several months  . 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 Malnutrition following gastrointestinal surgery   . Morbid obesity (HCC)   . Seasonal allergies   . SUI (stress urinary incontinence, female) 06-17-13   no longer an issue s/p bladder sling    Patient Active Problem List   Diagnosis Date Noted  . Trigger finger, right ring finger 09/21/2017  . Obesity 11/07/2013  . 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 Obesity, Class III, BMI 40-49.9 (morbid obesity) (HCC) 06/14/2013  . Urinary incontinence, mixed 02/02/2007  . Hyperlipidemia 11/23/2006  . Tobacco use disorder 11/23/2006  . Primary hypertension 11/23/2006  . Hydradenitis 11/23/2006    Past Surgical History:  Procedure Laterality Date  . BLADDER SUSPENSION  04/17/2012   Procedure: [facility] PROCEDURE;  Surgeon: Martina Sinner, MD;  Location: [facility];  Service: Urology;  Laterality: N/A;  . CHOLECYSTECTOMY  1991   . GASTRIC ROUX-EN-Y N/A 01/21/2020   Procedure: Conversion from Lap Gastric Sleeve to LAPAROSCOPIC ROUX-EN-Y GASTRIC BYPASS WITH UPPER ENDOSCOPY, ERAS Pathway;  Surgeon: Gaynelle Adu, MD;  Location: [facility];  Service: General;  Laterality: N/A;  . HIATAL HERNIA REPAIR N/A 06/24/2013   Procedure: LAPAROSCOPIC REPAIR OF HIATAL HERNIA;  Surgeon: Atilano Ina, MD;  Location: [facility];  Service: General;  Laterality: N/A;  . LAPAROSCOPIC GASTRIC SLEEVE RESECTION N/A 06/24/2013   Procedure: LAPAROSCOPIC GASTRIC SLEEVE RESECTION;  Surgeon: Atilano Ina, MD;  Location: [facility];  Service: General;  Laterality: N/A;  . TUBAL LIGATION  1991  . UPPER GI ENDOSCOPY N/A 06/24/2013   Procedure: UPPER GI ENDOSCOPY;  Surgeon: Atilano Ina, MD;  Location: [facility];  Service: General;  Laterality: N/A;     OB History   No obstetric history on file.     Family History  Problem Relation Age of Onset  . Colon cancer Neg Hx   . Esophageal cancer Neg Hx   . Stomach cancer Neg Hx   . Rectal cancer Neg Hx   . Breast cancer Neg Hx     Social History   Tobacco Use  . Smoking status: Former Smoker    Packs/day: 1.00    Years: 10.00    Pack years: 10.00    Types: Cigarettes    Quit date: 06/27/2019    Years since quitting: 0.6  . Smokeless tobacco: Never Used  Substance Use Topics  . Alcohol use: No  . Drug use: No  Home Medications Prior to Admission medications   Medication Sig Start Date End Date Taking? Authorizing Provider  CALCIUM PO Take 1 tablet by mouth daily.   Yes [provider]  dexlansoprazole (DEXILANT) 60 MG capsule Take 60 mg by mouth daily before breakfast.    Yes [provider]  Multiple Vitamin (MULTIVITAMIN WITH MINERALS) TABS tablet Take 1 tablet by mouth at bedtime.   Yes [provider]  amoxicillin-clavulanate (AUGMENTIN) 400-57 MG/5ML suspension Take 10.9 mLs (875 mg total) by mouth 2 (two) times daily for 7 days. 02/20/20 02/27/20  Pricilla LovelessGoldston, Teriana Danker, MD    azithromycin (ZITHROMAX) 200 MG/5ML suspension Take 6.3 mLs (250 mg total) by mouth daily for 4 days. 02/21/20 02/25/20  Pricilla LovelessGoldston, Andree Golphin, MD  docusate sodium (COLACE) 100 MG capsule Take 1 capsule (100 mg total) by mouth 2 (two) times daily. Patient not taking: Reported on 01/03/2020 09/02/19   Asencion IslamStover, Titorya, DPM  gabapentin (NEURONTIN) 100 MG capsule Take 2 capsules (200 mg total) by mouth every 12 (twelve) hours. Patient not taking: Reported on 02/20/2020 01/22/20   Gaynelle AduWilson, Eric, MD  ondansetron (ZOFRAN-ODT) 4 MG disintegrating tablet Take 1 tablet (4 mg total) by mouth every 6 (six) hours as needed for nausea or vomiting. Patient not taking: Reported on 02/20/2020 01/22/20   Gaynelle AduWilson, Eric, MD  oxyCODONE-acetaminophen (PERCOCET) 5-325 MG tablet Take 1 tablet by mouth every 4 (four) hours as needed for severe pain. 02/20/20   Pricilla LovelessGoldston, Derico Mitton, MD  traMADol (ULTRAM) 50 MG tablet Take 1 tablet (50 mg total) by mouth every 6 (six) hours as needed (pain). Patient not taking: Reported on 02/20/2020 01/22/20   Gaynelle AduWilson, Eric, MD    Allergies    Avelox [moxifloxacin hcl in nacl], Doxycycline, and Vicodin [hydrocodone-acetaminophen]  Review of Systems   Review of Systems  Constitutional: Positive for chills and fever.  Respiratory: Positive for cough and shortness of breath.   Cardiovascular: Positive for chest pain. Negative for leg swelling.  Gastrointestinal: Positive for vomiting. Negative for abdominal pain.  All other systems reviewed and are negative.   Physical Exam Updated Vital Signs BP 119/74   Pulse 87   Temp 98.8 F (37.1 C) (Oral)   Resp 16   Ht 5\' 5"  (1.651 m)   Wt 108.9 kg   SpO2 96%   BMI 39.94 kg/m   Physical Exam Vitals and nursing note reviewed.  Constitutional:      General: She is not in acute distress.    Appearance: She is well-developed. She is obese.  HENT:     Head: Normocephalic and atraumatic.     Right Ear: External ear normal.     Left Ear: External ear normal.      Nose: Nose normal.  Eyes:     General:        Right eye: No discharge.        Left eye: No discharge.  Cardiovascular:     Rate and Rhythm: Normal rate and regular rhythm.     Heart sounds: Normal heart sounds.  Pulmonary:     Effort: Pulmonary effort is normal. No accessory muscle usage or respiratory distress.     Breath sounds: Examination of the left-lower field reveals rales. Rales (mild) present.  Abdominal:     Palpations: Abdomen is soft.     Tenderness: There is no abdominal tenderness.  Musculoskeletal:     Right lower leg: No edema.     Left lower leg: No edema.  Skin:    General:  Skin is warm and dry.  Neurological:     Mental Status: She is alert.  Psychiatric:        Mood and Affect: Mood is not anxious.     ED Results / Procedures / Treatments   Labs (all labs ordered are listed, but only abnormal results are displayed) Labs Reviewed  BASIC METABOLIC PANEL - Abnormal; Notable for the following components:      Result Value   Glucose, Bld 112 (*)    All other components within normal limits  CBC  TROPONIN I (HIGH SENSITIVITY)  TROPONIN I (HIGH SENSITIVITY)    EKG EKG Interpretation  Date/Time:  Thursday Feb 20 2020 09:14:00 EDT Ventricular Rate:  81 PR Interval:    QRS Duration: 94 QT Interval:  370 QTC Calculation: 430 R Axis:   45 Text Interpretation: Sinus rhythm Consider left atrial enlargement Low voltage, precordial leads no acute ST/T changes similar to 2014 Confirmed by Pricilla Loveless [phone]) on 02/20/2020 9:39:24 AM   Radiology DG Chest 2 View  Result Date: 02/20/2020 CLINICAL DATA:  Chest pain EXAM: CHEST - 2 VIEW COMPARISON:  03/20/2013 FINDINGS: Multifocal opacity within the left lung. The right lung is clear. No pleural effusion or pneumothorax. IMPRESSION: Left lung opacities may indicate pneumonia. Electronically Signed   By: Deatra Robinson M.D.   On: 02/20/2020 01:41   CT Angio Chest PE W and/or Wo Contrast  Result Date:  02/20/2020 CLINICAL DATA:  Chest pain with hemoptysis. EXAM: CT ANGIOGRAPHY CHEST WITH CONTRAST TECHNIQUE: Multidetector CT imaging of the chest was performed using the standard protocol during bolus administration of intravenous contrast. Multiplanar CT image reconstructions and MIPs were obtained to evaluate the vascular anatomy. CONTRAST:  OMNIPAQUE IOHEXOL 350 MG/ML SOLN COMPARISON:  Chest radiograph Feb 20, 2020 FINDINGS: Cardiovascular: There is no demonstrable pulmonary embolus. There is no thoracic aortic aneurysm or dissection. Visualized great vessels appear unremarkable. No pericardial effusion or pericardial thickening is evident. Mediastinum/Nodes: There is a decreased attenuation mass arising from the right lobe of the thyroid measuring 3.6 x 3.1 x 3.0 cm. The attenuation of this mass suggests that it represents a cystic lesion. There are prominent lymph nodes in the aortopulmonary window region and adjacent to the descending thoracic aorta. The largest lymph node in this area measures 1.6 x 1.4 cm. Other lymph nodes in this region have short axis diameters of 1-1.2 cm as well as several subcentimeter lymph nodes in this area. There is a lymph node to the right of the trachea anteriorly measuring 1.2 x 1.1 cm. There is a subcarinal lymph node measuring 1.4 x 1.3 cm. There is a left hilar lymph node measuring 1.4 x 1.3 cm. There is a right hilar lymph node measuring 1.2 x 0.9 cm. There is a small hiatal hernia. Lungs/Pleura: There is airspace consolidation throughout much of the left upper lobe with involvement of anterior and posterior segments. There is a somewhat nodular appearing opacity anterior segment of the left upper lobe measuring 1.3 x 1.2 cm. This nodular opacity is adjacent to areas of more consolidated airspace opacity. A second somewhat nodular appearing opacity is noted in the posterior segment left upper lobe abutting the minor fissure measuring 0.9 x 0.9 cm. There is airspace  opacity throughout much of the lingula as well as patchy infiltrate in the left lower lobe anteriorly. Opacity in the anterior segment left lower lobe has a somewhat nodular appearance measuring 2.3 x 1.4 cm. No cavitation noted in any of these  areas. There is mild consolidation in the posterior left base which in part may be due to atelectasis. There is atelectatic change in the posterior right base as well. Right lung is otherwise clear. No appreciable pleural effusions. Upper Abdomen: Evidence of previous gastric bypass surgery and cholecystectomy. No bowel wall thickening or fluid in visualized postoperative areas. There is a left adrenal adenoma measuring 1.1 x 1.1 cm. There is a right adrenal adenoma measuring 0.7 x 0.6 cm. Visualized upper abdominal structures otherwise appear normal. Musculoskeletal: No blastic or lytic bone lesions. There are foci of degenerative change in the thoracic spine. No chest wall lesions are evident. Review of the MIP images confirms the above findings. IMPRESSION: 1.  No demonstrable pulmonary embolus.  No thoracic adenopathy. 2. Multifocal pneumonia throughout much of the left upper lobe and lingula regions as well as in the anterior segment of the left upper lobe. With in these areas of infiltrate, there are several somewhat nodular appearing opacities which may well represent pneumonia but conceivably could represent underlying neoplasm. This finding warrants follow-up study in 4-6 weeks after appropriate treatment for pneumonia. No cavitation evident. Note that there is bibasilar atelectatic change, more on the left than the right. A degree of superimposed pneumonia in the posterior left base is questioned. 3. Adenopathy at several sites. This adenopathy may well be of reactive etiology given the extensive pneumonitis in the left lung. Neoplastic etiology cannot be excluded, however. Particular attention to these lymph nodes on subsequent evaluations warranted. 4. Dominant  right lobe thyroid mass measuring 3.6 x 3.1 x 3.0 cm. Recommend thyroid US. (Ref: J Am Coll Radiol. 2015 Feb;12(2): 143-50). The attenuation of this lesion suggests that it is cystic in nature. 5.  Small hiatal hernia. 6. Postoperative gastric bypass procedure without visualized complicating features. 7.  Gallbladder absent. 8.  Small benign adrenal adenomas bilaterally. Electronically Signed   By: Lowella Grip III M.D.   On: 02/20/2020 08:41    Procedures Procedures (including critical care time)  Medications Ordered in ED Medications  sodium chloride (PF) 0.9 % injection (has no administration in time range)  sodium chloride 0.9 % 1,000 mL with thiamine 563 mg, folic acid 1 mg, multivitamins adult 10 mL infusion (has no administration in time range)  HYDROmorphone (DILAUDID) injection 1 mg (1 mg Intravenous Given 02/20/20 0823)  sodium chloride 0.9 % bolus 1,000 mL (0 mLs Intravenous Stopped 02/20/20 0944)  amoxicillin-clavulanate (AUGMENTIN) 875-125 MG per tablet 1 tablet (1 tablet Oral Given 02/20/20 0824)  azithromycin (ZITHROMAX) tablet 500 mg (500 mg Oral Given 02/20/20 0824)  iohexol (OMNIPAQUE) 350 MG/ML injection 100 mL (100 mLs Intravenous Contrast Given 02/20/20 0804)    ED Course  I have reviewed the triage vital signs and the nursing notes.  Pertinent labs & imaging results that were available during my care of the patient were reviewed by me and considered in my medical decision making (see chart for details).    MDM Rules/Calculators/A&P                      Chest x-ray most consistent with pneumonia.  Given the mild hemoptysis and pleuritic pain and being recently postop, there is still a degree of concern for PE, especially with normal WBC and no fever here.  CTA obtained but shows no pulmonary embolus.  Does show the pneumonia as well as lung nodules and thyroid mass.  She was made aware of these.  Will need outpatient follow-up.  From  a pneumonia perspective, she  primarily needs pain control as she is not hypoxic or in respiratory distress/increased work of breathing.  No signs of sepsis.  Troponins were obtained because of the chest pain but the chest pain appears to be related to the pneumonia.  ECG, labs, and CT/x-ray imaging have been personally viewed by myself.  Dr. Andrey Campanile also saw patient as she is recently postop and ordered a banana bag.  She will be discharged home with oral antibiotics and return precautions. Final Clinical Impression(s) / ED Diagnoses Final diagnoses:  Multifocal pneumonia  Lung nodule  Thyroid mass of unclear etiology    Rx / DC Orders ED Discharge Orders         Ordered    azithromycin (ZITHROMAX) 200 MG/5ML suspension  Daily     02/20/20 0925    oxyCODONE-acetaminophen (PERCOCET) 5-325 MG tablet  Every 4 hours PRN     02/20/20 0925    amoxicillin-clavulanate (AUGMENTIN) 400-57 MG/5ML suspension  2 times daily     02/20/20 8453           Pricilla Loveless, MD 02/20/20 1458

## 2020-02-20 NOTE — Progress Notes (Signed)
BP 119/74   Pulse 87   Temp 98.8 F (37.1 C) (Oral)   Resp 16   Ht 5\' 5"  (1.651 m)   Wt 108.9 kg   SpO2 96%   BMI 39.94 kg/m    Patient seen and examined this morning in the emergency department.  Husband at bedside I saw her in the clinic yesterday She was afebrile with normal vital signs She reported several day history of left neck and shoulder pain and left-sided pain.  She was tolerating liquids.  She has been around her grandmother mid last week who had pneumonia.  Patient also reported fever over the weekend.  But when she came to clinic yesterday she had been afebrile for more than 24 hours.  We ordered labs which were unremarkable.  Apparently last night in the [employer] she developed severe left-sided pain and shortness of breath and called the on-call surgeon and was directed to report to the emergency room for evaluation  Labs and imaging personally reviewed  Extensive left-sided pneumonia Incidental thyroid lesion which will require outpatient follow-up  Discussed her case with the EDP He feels that she is stable for discharge since she is not hypoxic, not tachypneic and no significant leukocytosis  I agree  Patient already got 1 oral antibiotic tablet in the emergency room she said it was very difficult to tolerate so the EDP is going to verify with pharmacy whether or not her antibiotic can be crushed and or switch to a liquid form  I asked the patient to continue her Dexilant at home and open up the capsule and mix it in a little bit of water to take it for the next couple of weeks  We also discussed the importance of ambulation over the next few days and week  I also asked the ER nurse to provide the patient with an incentive spirometer and flutter valve to take home  We will give her another liter of fluids before she leaves  In anticipation of possible hydration issues over the weekend I will asked the office to arrange for an IV infusion on Tuesday in case that  she has some difficulty with liquids over the weekend.  She is well aware of what to alert our office or to call the office for  Sunday. Mary Sella, MD, FACS General, Bariatric, & Minimally Invasive Surgery [facility], [facility]

## 2020-02-20 NOTE — Discharge Instructions (Addendum)
If you develop continued fever, vomiting, new or worsening chest pain, or any other new/concerning symptoms and return to the ER for evaluation.  The CT scan shows some nodules in your lungs that will need follow-up imaging in 4-6 weeks by her family doctor.  He also have a "mass" on the right side of your thyroid gland that needs to also be followed up with an ultrasound.  Your primary care physician can arrange both of these.  Start the Augmentin tonight.  You were given the first dose today in the morning.  Start the azithromycin tomorrow as you were given your once daily dose today.

## 2020-02-20 NOTE — ED Triage Notes (Addendum)
Pt sts left sided chest pain that radiates to neck and arm for approx 4 days. Seen PCP today for blood work. Sts her Dr thinks her diaphragm is acting up

## 2020-02-25 ENCOUNTER — Other Ambulatory Visit: Payer: Self-pay

## 2020-02-25 ENCOUNTER — Ambulatory Visit (HOSPITAL_COMMUNITY)
Admit: 2020-02-25 | Discharge: 2020-02-25 | Disposition: A | Discharge status: Home / Self Care | Payer: BC Managed Care – PPO | Source: Ambulatory Visit | Attending: Internal Medicine | Admitting: Internal Medicine

## 2020-02-25 DIAGNOSIS — E860 Dehydration: Secondary | ICD-10-CM | POA: Diagnosis present

## 2020-02-25 MED ORDER — THIAMINE HCL 100 MG/ML IJ SOLN
Freq: Once | INTRAVENOUS | Status: AC
  Filled 2020-02-25: qty 1000

## 2020-02-25 MED ORDER — SODIUM CHLORIDE 0.9 % IV BOLUS
1000.0000 mL | Freq: Once | INTRAVENOUS | Status: AC
  Administered 2020-02-25: 1000 mL via INTRAVENOUS

## 2020-02-25 NOTE — Progress Notes (Signed)
PATIENT CARE CENTER NOTE   Provider: Gaynelle Adu, MD   Procedure: Banana Bag and NS bolus   Note: Patient received a Banana Bag (Thiamine, Folic Acid, multivitamins) followed by 0.9% Sodium Chloride bolus. Each bag was given over 1 hour. Patient tolerated well with no adverse reaction. Vital signs stable. Discharge instructions given. Patient alert,oriented and ambulatory at discharge.

## 2020-02-25 NOTE — Discharge Instructions (Signed)

## 2020-02-26 ENCOUNTER — Ambulatory Visit (INDEPENDENT_AMBULATORY_CARE_PROVIDER_SITE_OTHER): Payer: BC Managed Care – PPO | Admitting: Family Medicine

## 2020-02-26 ENCOUNTER — Encounter: Payer: Self-pay | Admitting: Family Medicine

## 2020-02-26 VITALS — BP 118/72 | HR 88 | Ht 65.0 in | Wt 239.2 lb

## 2020-02-26 DIAGNOSIS — E041 Nontoxic single thyroid nodule: Secondary | ICD-10-CM | POA: Diagnosis not present

## 2020-02-26 DIAGNOSIS — R591 Generalized enlarged lymph nodes: Secondary | ICD-10-CM | POA: Diagnosis not present

## 2020-02-26 DIAGNOSIS — R918 Other nonspecific abnormal finding of lung field: Secondary | ICD-10-CM | POA: Diagnosis not present

## 2020-02-26 DIAGNOSIS — E079 Disorder of thyroid, unspecified: Secondary | ICD-10-CM

## 2020-02-26 NOTE — Patient Instructions (Signed)
Great seeing you today!  I am sorry you have been having trouble with chest pain and your pneumonia.  We will get you set up with the CT scan for your chest and the ultrasound for your thyroid.  Lungs do sound pretty good today so we will opt to continue to watch it for now.

## 2020-02-27 ENCOUNTER — Encounter: Payer: Self-pay | Admitting: Family Medicine

## 2020-02-27 DIAGNOSIS — E079 Disorder of thyroid, unspecified: Secondary | ICD-10-CM | POA: Insufficient documentation

## 2020-02-27 DIAGNOSIS — R591 Generalized enlarged lymph nodes: Secondary | ICD-10-CM | POA: Insufficient documentation

## 2020-02-27 NOTE — Assessment & Plan Note (Addendum)
Numerous reactive lymph nodes on CT angio.  We will get recheck in 4 to 6 weeks to ensure resolution.  Scheduled today.

## 2020-02-27 NOTE — Progress Notes (Signed)
   CHIEF COMPLAINT / HPI: 54 year old female who presents for emergency department follow up. Patient was seen on 02/20/2020 in the emergency department for cough, shortness of breath and chest pain. She had a CT angio which ruled out a PE but show a multifocal pneumonia. She was found to have several enlarged lymph nodes in her lungs.  It was recommended to get a repeat CT scan in 4 to 6 weeks to ensure resolution of the lymphadenopathy.  On the same scan patient was found to have a right lobe thyroid mass measuring 3.6 x 3.1 x 3.0.  It was recommended to get a thyroid ultrasound for further evaluation.  Patient states that overall she is still having some cough with some subjective shortness of breath at night.  Occasionally this occurs with activity as well, although this has improved some since her emergency department visit.  PERTINENT  PMH / PSH: N/A   OBJECTIVE: BP 118/72   Pulse 88   Ht 5\' 5"  (1.651 m)   Wt 239 lb 3.2 oz (108.5 kg)   SpO2 97%   BMI 39.80 kg/m   Gen: 54 year old Caucasian female, no acute distress, comfortable CV: Regular rate and rhythm, no M/R/G Resp: Lungs clear to auscultation bilaterally, no accessory muscle use, no distress Neuro: Alert and oriented, Speech clear, No gross deficits   ASSESSMENT / PLAN:  Lymphadenopathy Numerous reactive lymph nodes on CT angio.  We will get recheck in 4 to 6 weeks to ensure resolution.  Scheduled today.  Thyroid mass Right lobe thyroid mass as seen on CT angio.  Patient scheduled for thyroid ultrasound for further evaluation.  Will update with plan after imaging study complete.     56 MD PGY-3 Family Medicine Resident [facility]

## 2020-02-27 NOTE — Assessment & Plan Note (Signed)
Right lobe thyroid mass as seen on CT angio.  Patient scheduled for thyroid ultrasound for further evaluation.  Will update with plan after imaging study complete.

## 2020-03-02 ENCOUNTER — Ambulatory Visit (HOSPITAL_COMMUNITY)
Admission: RE | Admit: 2020-03-02 | Discharge: 2020-03-02 | Disposition: A | Discharge status: Home / Self Care | Payer: BC Managed Care – PPO | Source: Ambulatory Visit | Attending: Family Medicine | Admitting: Family Medicine

## 2020-03-02 ENCOUNTER — Other Ambulatory Visit: Payer: Self-pay

## 2020-03-02 DIAGNOSIS — E041 Nontoxic single thyroid nodule: Secondary | ICD-10-CM | POA: Diagnosis not present

## 2020-03-09 ENCOUNTER — Ambulatory Visit (INDEPENDENT_AMBULATORY_CARE_PROVIDER_SITE_OTHER): Payer: BC Managed Care – PPO | Admitting: Family Medicine

## 2020-03-09 ENCOUNTER — Encounter: Payer: Self-pay | Admitting: Family Medicine

## 2020-03-09 ENCOUNTER — Other Ambulatory Visit: Payer: Self-pay

## 2020-03-09 VITALS — BP 110/70 | HR 85 | Temp 98.3°F | Wt 233.0 lb

## 2020-03-09 DIAGNOSIS — Z9884 Bariatric surgery status: Secondary | ICD-10-CM

## 2020-03-09 DIAGNOSIS — R0781 Pleurodynia: Secondary | ICD-10-CM | POA: Diagnosis not present

## 2020-03-09 DIAGNOSIS — E079 Disorder of thyroid, unspecified: Secondary | ICD-10-CM

## 2020-03-09 MED ORDER — OXYCODONE-ACETAMINOPHEN 5-325 MG PO TABS: 1.0000 | ORAL_TABLET | ORAL | 0 refills | Status: DC | PRN

## 2020-03-09 MED ORDER — DICLOFENAC SODIUM 1 % EX GEL: 4.0000 g | Freq: Four times a day (QID) | CUTANEOUS | 1 refills | Status: AC

## 2020-03-09 NOTE — Patient Instructions (Addendum)
Thank you for coming to see me today. It was a pleasure! Today we talked about:   For your pain, we have gotten labs today that I will release on my chart or call you if anything is abnormal. I recommend trying the Voltaren gel over the area if this is due to a pulled muscle from your recent pneumonia. I have given you a few pain medications to get you through the next few nights. I do recommend that you continue to follow-up with your surgeon.  I will place an order to have a fine-needle aspiration done of your thyroid after the recent ultrasound. They will call you to schedule this. Please let us know if you do not hear from them within 1 to 2 weeks.   If your pain worsens or does not improve then please not hesitate to come back to our office.  If you have any questions or concerns, please do not hesitate to call the office at [phone].  Take Care,   [place] Marina Desire, DO

## 2020-03-09 NOTE — Progress Notes (Signed)
   SUBJECTIVE:   CHIEF COMPLAINT / HPI:   Left-sided flank/rib pain: 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 01/21/20 patient has follow-up with surgeon later this week.  Patient reports that the pain started a few days ago and increased last night so that she was unable to sleep.  She states she is unable to sleep on that side.  She reports that she was recently treated for pneumonia in late May and has finished her course of antibiotics.  She states she was feeling better and at that time was having pain only with deep breaths but then this pain started a few days ago that is worse with certain movements and when she is lying on her side.  She states that it does not hurt as much when she is breathing in and out but she does feel some discomfort at those times.  Patient has noticed that the pain is worse when she is eating.  She states that sometimes it also occurs in her epigastric region and radiates around to her back.  She is not sure what is causing this.  She denies any vomiting, diarrhea, constipation but does endorse some nausea at times.  Patient reports that her recent CT scan showed that her hiatal hernia remains.  She reports that she has a history of GERD but this feels different than her normal acid reflux.  She denies any chest pain, shortness of breath, fevers, chills, cough.  Thyroid nodule noted on recent ultrasound Patient has not had any follow-up of recent thyroid ultrasound which noted Rt Mid, 2.7 x 1.6 x 2.2 cm mixed cystic and solid.   PERTINENT  PMH / PSH: s/p gastric sleeve 5/27  OBJECTIVE:  BP 110/70   Pulse 85   Temp 98.3 F (36.8 C) (Oral)   Wt 233 lb (105.7 kg)   SpO2 96%   BMI 38.77 kg/m   General: NAD, pleasant Cardiovascular: RRR, no m/r/g, no LE edema Respiratory: CTA BL, normal work of breathing Gastrointestinal: soft, nontender, nondistended, normoactive BS, no CVA tenderness Psych: AOx3, appropriate affect  ASSESSMENT/PLAN:    Rib pain Patient with pain over left side around rib 10.  Can be painful with palpation which would indicate costochondritis but patient also with recent pneumonia which puts pleurisy on the differential.  Patient reports that her pain has often been epigastric and radiated around to her back, however lipase obtained in office WNL making pancreatitis less likely.  Patient also with history of hiatal hernia which was repaired on 4/27 and on recent CT scan shows that it may have not been repaired that also could be causing patient's symptoms given that they are worse after she eats.  Lung exam CTA BL today with no indications of recurrence of pneumonia patient afebrile with normal vitals at this time. -CMP with minimally elevated AST/ALT.  Recommend repeating to ensure improvement given recent surgery, patient with normal abdominal exam -Will trial Voltaren gel topically over the area, a few norco given for pain given that patient cannot have NSAID's if this is pleurisy- PDMP reviewed and is appropriate.  -Patient encouraged to follow-up with surgeon later this week as scheduled -Strict return precautions discussed and patient voiced understanding  Thyroid mass FNA ordered for recent thyrid mass seen on [place]. Rt Mid, 2.7 x 1.6 x 2.2 cm mixed cystic and solid.    [place] Alven Alverio, DO PGY-3, [facility]

## 2020-03-10 LAB — COMPREHENSIVE METABOLIC PANEL
ALT: 42 IU/L — ABNORMAL HIGH (ref 0–32)
AST: 61 IU/L — ABNORMAL HIGH (ref 0–40)
Albumin/Globulin Ratio: 1.4 (ref 1.2–2.2)
Albumin: 4 g/dL (ref 3.8–4.9)
Alkaline Phosphatase: 88 IU/L (ref 48–121)
BUN/Creatinine Ratio: 21 (ref 9–23)
BUN: 16 mg/dL (ref 6–24)
Bilirubin Total: 0.3 mg/dL (ref 0.0–1.2)
CO2: 22 mmol/L (ref 20–29)
Calcium: 9.4 mg/dL (ref 8.7–10.2)
Chloride: 102 mmol/L (ref 96–106)
Creatinine, Ser: 0.75 mg/dL (ref 0.57–1.00)
GFR calc Af Amer: 105 mL/min/{1.73_m2} (ref >59)
GFR calc non Af Amer: 91 mL/min/{1.73_m2} (ref >59)
Globulin, Total: 2.9 g/dL (ref 1.5–4.5)
Glucose: 91 mg/dL (ref 65–99)
Potassium: 4.3 mmol/L (ref 3.5–5.2)
Sodium: 140 mmol/L (ref 134–144)
Total Protein: 6.9 g/dL (ref 6.0–8.5)

## 2020-03-10 LAB — LIPASE: Lipase: 21 U/L (ref 14–72)

## 2020-03-12 ENCOUNTER — Ambulatory Visit
Admission: RE | Admit: 2020-03-12 | Discharge: 2020-03-12 | Disposition: A | Discharge status: Home / Self Care | Payer: BC Managed Care – PPO | Source: Ambulatory Visit | Attending: Family Medicine | Admitting: Family Medicine

## 2020-03-12 ENCOUNTER — Other Ambulatory Visit (HOSPITAL_COMMUNITY)
Admission: RE | Admit: 2020-03-12 | Discharge: 2020-03-12 | Disposition: A | Discharge status: Home / Self Care | Payer: BC Managed Care – PPO | Source: Ambulatory Visit | Attending: Radiology | Admitting: Radiology

## 2020-03-12 DIAGNOSIS — E041 Nontoxic single thyroid nodule: Secondary | ICD-10-CM | POA: Diagnosis present

## 2020-03-12 DIAGNOSIS — E079 Disorder of thyroid, unspecified: Secondary | ICD-10-CM

## 2020-03-13 DIAGNOSIS — R0781 Pleurodynia: Secondary | ICD-10-CM | POA: Insufficient documentation

## 2020-03-13 LAB — CYTOLOGY - NON PAP

## 2020-03-13 NOTE — Assessment & Plan Note (Addendum)
Patient with pain over left side around rib 10.  Can be painful with palpation which would indicate costochondritis but patient also with recent pneumonia which puts pleurisy on the differential.  Patient reports that her pain has often been epigastric and radiated around to her back, however lipase obtained in office WNL making pancreatitis less likely.  Patient also with history of hiatal hernia which was repaired on 4/27 and on recent CT scan shows that it may have not been repaired that also could be causing patient's symptoms given that they are worse after she eats.  Lung exam CTA BL today with no indications of recurrence of pneumonia patient afebrile with normal vitals at this time. -CMP with minimally elevated AST/ALT.  Recommend repeating to ensure improvement given recent surgery, patient with normal abdominal exam -Will trial Voltaren gel topically over the area, a few norco given for pain given that patient cannot have NSAID's if this is pleurisy- PDMP reviewed and is appropriate.  -Patient encouraged to follow-up with surgeon later this week as scheduled -Strict return precautions discussed and patient voiced understanding

## 2020-03-13 NOTE — Assessment & Plan Note (Signed)
FNA ordered for recent thyrid mass seen on [place]. Rt Mid, 2.7 x 1.6 x 2.2 cm mixed cystic and solid.

## 2020-03-17 ENCOUNTER — Other Ambulatory Visit: Payer: Self-pay

## 2020-03-17 ENCOUNTER — Encounter: Payer: BC Managed Care – PPO | Attending: General Surgery | Admitting: Skilled Nursing Facility1

## 2020-03-17 DIAGNOSIS — E669 Obesity, unspecified: Secondary | ICD-10-CM | POA: Insufficient documentation

## 2020-03-17 NOTE — Progress Notes (Signed)
Bariatric Nutrition Follow-Up Visit Medical Nutrition Therapy   Pt given star previously: no  NUTRITION ASSESSMENT    Anthropometrics  Surgery date: 01/21/2020 Surgery type: RYGB converted from Sleeve Start weight at [facility]: 251.9 Weight today: 229.3   Body Composition Scale 03/17/2020  Total Body Fat % 44.4  Visceral Fat 15  Fat-Free Mass % 55.5   Total Body Water % 42.2   Muscle-Mass lbs 30.1  Body Fat Displacement          Torso  lbs 63.1         Left Leg  lbs 12.6         Right Leg  lbs 12.6         Left Arm  lbs 6.3         Right Arm   lbs 6.3   Clinical  Medical hx: GERD Medications:  Labs:    Lifestyle & Dietary Hx  Pt states she was admitted to the hospital for pneumonia.  Pt states she is starting to feel better. Pt states they also found a nodule on her thyroid which has been biopsied. Pt states eggs do not sit well. Pt states she has tried grits which did not sit well. Pt states she does not like anything processed and does not like any of the soy or pea based options so she has nothing to eat for breakfast. Pt states she has eaten pizza stating she only ate the toppings.   Estimated daily fluid intake: 36-64 oz Estimated daily protein intake: 60+ g Supplements: bari multi and 2-3 calcium  Current average weekly physical activity: ADL's  24-Hr Dietary Recall First Meal: protein shake  Snack:  Second Meal: shrimp Snack:  Third Meal: pizza toppings  Snack:  Beverages: water  Post-Op Goals/ Signs/ Symptoms Using straws: no Drinking while eating: no Chewing/swallowing difficulties: no Changes in vision: no Changes to mood/headaches: no Hair loss/changes to skin/nails: no Difficulty focusing/concentrating: no Sweating: no Dizziness/lightheadedness: no Palpitations: no  Carbonated/caffeinated beverages: no N/V/D/C/Gas: no: taking miralax daily  Abdominal pain: no Dumping syndrome: no    NUTRITION DIAGNOSIS  Overweight/obesity (Stanley-3.3) related to  past poor dietary habits and physical inactivity as evidenced by completed bariatric surgery and following dietary guidelines for continued weight loss and healthy nutrition status.     NUTRITION INTERVENTION Nutrition counseling (C-1) and education (E-2) to facilitate bariatric surgery goals, including: . Diet advancement to the next phase (phase 4) now including non starchy vegetables  . The importance of consuming adequate calories as well as certain nutrients daily due to the body's need for essential vitamins, minerals, and fats . The importance of daily physical activity and to reach a goal of at least 150 minutes of moderate to vigorous physical activity weekly (or as directed by their physician) due to benefits such as increased musculature and improved lab values . The importance of intuitive eating specifically learning hunger-satiety cues and understanding the importance of learning a new body  Goals: -use more seafood for protein  -Continue to aim for a minimum of 64 fluid ounces 7 days a week with at least 30 ounces being plain water -Eat non-starchy vegetables 2 times a day 7 days a week -Start out with soft cooked vegetables today and tomorrow; if tolerated begin to eat raw vegetables or cooked including salads -Eat your 3 ounces of protein first then start in on your non-starchy vegetables; once you understand how much of your meal leads to satisfaction and not full while still  eating 3 ounces of protein and non-starchy vegetables you can eat them in any order  -Continue to aim for 30 minutes of activity at least 5 times a week -Do NOT cook with/add to your food: alfredo sauce, cheese sauce, barbeque sauce, ketchup, fat back, butter, bacon grease, grease, Crisco, OR SUGAR  Handouts Provided Include   Non starchy vegetables   Learning Style & Readiness for Change Teaching method utilized: Visual & Auditory  Demonstrated degree of understanding via: Teach Back  Barriers to  learning/adherence to lifestyle change:    RD's Notes for Next Visit . Assess adherence to pt chosen goals   MONITORING & EVALUATION Dietary intake, weekly physical activity, body weight  Next Steps Patient is to follow-up

## 2020-03-27 ENCOUNTER — Telehealth: Payer: Self-pay

## 2020-03-27 NOTE — Telephone Encounter (Signed)
[facility] calls nurse line requesting [place] to fax CT order with physician signature and date.  This has been done. Signed by Pennie Rushing is no longer here.

## 2020-04-01 ENCOUNTER — Other Ambulatory Visit: Payer: Self-pay

## 2020-04-01 ENCOUNTER — Ambulatory Visit (HOSPITAL_COMMUNITY)
Admission: RE | Admit: 2020-04-01 | Discharge: 2020-04-01 | Disposition: A | Discharge status: Home / Self Care | Payer: BC Managed Care – PPO | Source: Ambulatory Visit | Attending: Family Medicine | Admitting: Family Medicine

## 2020-04-01 DIAGNOSIS — R918 Other nonspecific abnormal finding of lung field: Secondary | ICD-10-CM | POA: Insufficient documentation

## 2020-07-07 ENCOUNTER — Encounter: Payer: BC Managed Care – PPO | Attending: General Surgery | Admitting: Skilled Nursing Facility1

## 2020-07-07 DIAGNOSIS — E6609 Other obesity due to excess calories: Secondary | ICD-10-CM | POA: Insufficient documentation

## 2020-07-09 NOTE — Progress Notes (Signed)
Follow-up visit:  Post-Operative sleeve to RYGB Surgery  Medical Nutrition Therapy:  Appt start time: 6:15pm end time:  6:45pm  Primary concerns today: Post-operative Bariatric Surgery Nutrition Management 6 Month Post-Op Class  Pt arrived 1 HOUR late to class so unable to get weight and unable to check in as front office staff had left for the evening.   Information Reviewed/ Discussed During Appointment: -Review of composition scale numbers -Fluid requirements (64-100 ounces) -Protein requirements (60-80g) -Strategies for tolerating diet -Advancement of diet to include Starchy vegetables -Barriers to inclusion of new foods -Inclusion of appropriate multivitamin and calcium supplements  -Exercise recommendations   Fluid intake: adequate   Medications: See List Supplementation: appropriate   Using straws: no Drinking while eating: no Having you been chewing well: yes Chewing/swallowing difficulties: no Changes in vision: no Changes to mood/headaches: no Hair loss/Cahnges to skin/Changes to nails: no Any difficulty focusing or concentrating: no Sweating: no Dizziness/Lightheaded: no Palpitations: no  Carbonated beverages: no N/V/D/C/GAS: no Abdominal Pain: no Dumping syndrome: no  Recent physical activity:  ADL's  Progress Towards Goal(s):  In Progress  Handouts given during visit include:  Phase V diet Progression   Goals Sheet  The Benefits of Exercise are endless.....  Support Group Topics  Teaching Method Utilized:  Visual Auditory Hands on  Demonstrated degree of understanding via:  Teach Back   Monitoring/Evaluation:  Dietary intake, exercise, and body weight. Follow up in 3 months for 9 month post-op visit.

## 2020-09-05 ENCOUNTER — Emergency Department (HOSPITAL_BASED_OUTPATIENT_CLINIC_OR_DEPARTMENT_OTHER): Payer: BC Managed Care – PPO

## 2020-09-05 ENCOUNTER — Other Ambulatory Visit: Payer: Self-pay

## 2020-09-05 ENCOUNTER — Observation Stay (HOSPITAL_BASED_OUTPATIENT_CLINIC_OR_DEPARTMENT_OTHER)
Admission: EM | Admit: 2020-09-05 | Discharge: 2020-09-06 | Disposition: A | Discharge status: Home / Self Care | Payer: BC Managed Care – PPO | Attending: General Surgery | Admitting: General Surgery

## 2020-09-05 ENCOUNTER — Encounter (HOSPITAL_BASED_OUTPATIENT_CLINIC_OR_DEPARTMENT_OTHER): Payer: Self-pay | Admitting: Emergency Medicine

## 2020-09-05 DIAGNOSIS — K37 Unspecified appendicitis: Secondary | ICD-10-CM | POA: Diagnosis present

## 2020-09-05 DIAGNOSIS — Z79899 Other long term (current) drug therapy: Secondary | ICD-10-CM | POA: Insufficient documentation

## 2020-09-05 DIAGNOSIS — K3580 Unspecified acute appendicitis: Principal | ICD-10-CM | POA: Insufficient documentation

## 2020-09-05 DIAGNOSIS — Z87891 Personal history of nicotine dependence: Secondary | ICD-10-CM | POA: Insufficient documentation

## 2020-09-05 DIAGNOSIS — I10 Essential (primary) hypertension: Secondary | ICD-10-CM | POA: Diagnosis not present

## 2020-09-05 DIAGNOSIS — K219 Gastro-esophageal reflux disease without esophagitis: Secondary | ICD-10-CM | POA: Insufficient documentation

## 2020-09-05 DIAGNOSIS — Z20822 Contact with and (suspected) exposure to covid-19: Secondary | ICD-10-CM | POA: Insufficient documentation

## 2020-09-05 DIAGNOSIS — R1031 Right lower quadrant pain: Secondary | ICD-10-CM | POA: Diagnosis present

## 2020-09-05 LAB — COMPREHENSIVE METABOLIC PANEL
ALT: 34 U/L (ref 0–44)
AST: 28 U/L (ref 15–41)
Albumin: 4.2 g/dL (ref 3.5–5.0)
Alkaline Phosphatase: 80 U/L (ref 38–126)
Anion gap: 12 (ref 5–15)
BUN: 15 mg/dL (ref 6–20)
CO2: 24 mmol/L (ref 22–32)
Calcium: 9.4 mg/dL (ref 8.9–10.3)
Chloride: 102 mmol/L (ref 98–111)
Creatinine, Ser: 0.8 mg/dL (ref 0.44–1.00)
GFR, Estimated: >60 mL/min (ref >60)
Glucose, Bld: 112 mg/dL — ABNORMAL HIGH (ref 70–99)
Potassium: 3.9 mmol/L (ref 3.5–5.1)
Sodium: 138 mmol/L (ref 135–145)
Total Bilirubin: 0.6 mg/dL (ref 0.3–1.2)
Total Protein: 7.9 g/dL (ref 6.5–8.1)

## 2020-09-05 LAB — URINALYSIS, ROUTINE W REFLEX MICROSCOPIC
Bilirubin Urine: NEGATIVE
Glucose, UA: NEGATIVE mg/dL
Hgb urine dipstick: NEGATIVE
Ketones, ur: NEGATIVE mg/dL
Leukocytes,Ua: NEGATIVE
Nitrite: NEGATIVE
Protein, ur: NEGATIVE mg/dL
Specific Gravity, Urine: >1.03 — ABNORMAL HIGH (ref 1.005–1.030)
pH: 5.5 (ref 5.0–8.0)

## 2020-09-05 LAB — CBC
HCT: 43.4 % (ref 36.0–46.0)
Hemoglobin: 14.7 g/dL (ref 12.0–15.0)
MCH: 30.3 pg (ref 26.0–34.0)
MCHC: 33.9 g/dL (ref 30.0–36.0)
MCV: 89.5 fL (ref 80.0–100.0)
Platelets: 376 10*3/uL (ref 150–400)
RBC: 4.85 MIL/uL (ref 3.87–5.11)
RDW: 12.2 % (ref 11.5–15.5)
WBC: 13.6 10*3/uL — ABNORMAL HIGH (ref 4.0–10.5)
nRBC: 0 % (ref 0.0–0.2)

## 2020-09-05 LAB — RESP PANEL BY RT-PCR (FLU A&B, COVID) ARPGX2
Influenza A by PCR: NEGATIVE
Influenza B by PCR: NEGATIVE
SARS Coronavirus 2 by RT PCR: NEGATIVE

## 2020-09-05 LAB — LIPASE, BLOOD: Lipase: 26 U/L (ref 11–51)

## 2020-09-05 LAB — PREGNANCY, URINE: Preg Test, Ur: NEGATIVE

## 2020-09-05 MED ORDER — PIPERACILLIN-TAZOBACTAM 3.375 G IVPB 30 MIN
3.3750 g | Freq: Once | INTRAVENOUS | Status: AC
  Administered 2020-09-05: 3.375 g via INTRAVENOUS
  Filled 2020-09-05: qty 50

## 2020-09-05 MED ORDER — SODIUM CHLORIDE 0.9 % IV SOLN: INTRAVENOUS | Status: DC

## 2020-09-05 MED ORDER — FENTANYL CITRATE (PF) 100 MCG/2ML IJ SOLN
50.0000 ug | INTRAMUSCULAR | Status: DC | PRN
  Administered 2020-09-05: 50 ug via NASAL
  Filled 2020-09-05: qty 2

## 2020-09-05 MED ORDER — ONDANSETRON 4 MG PO TBDP
4.0000 mg | ORAL_TABLET | Freq: Once | ORAL | Status: AC | PRN
  Administered 2020-09-05: 4 mg via ORAL
  Filled 2020-09-05: qty 1

## 2020-09-05 MED ORDER — ONDANSETRON HCL 4 MG/2ML IJ SOLN
4.0000 mg | Freq: Once | INTRAMUSCULAR | Status: AC
  Administered 2020-09-05: 4 mg via INTRAVENOUS
  Filled 2020-09-05: qty 2

## 2020-09-05 MED ORDER — HYDROMORPHONE HCL 1 MG/ML IJ SOLN
0.5000 mg | Freq: Once | INTRAMUSCULAR | Status: AC
  Administered 2020-09-05: 0.5 mg via INTRAVENOUS
  Filled 2020-09-05: qty 1

## 2020-09-05 NOTE — ED Triage Notes (Signed)
RLQ pain with nausea since yesterday. Pt took 8mg  zofran at 

## 2020-09-05 NOTE — ED Notes (Signed)
Per EDP, will hold on IV access, CT is ordered without contrast due possible allergy reaction

## 2020-09-05 NOTE — ED Notes (Signed)
Presents with RLQ pain, having nausea as well, having a lot of cramping as well, onset yesterday. Poor appetite, retching some. Denies having fevers.

## 2020-09-05 NOTE — Progress Notes (Signed)
Patient ID: Brenda Ware, female   DOB: 05-22-1966, 54 y.o.   MRN: [id number] Uncomplicated appendicitis by ct, covid negative, will be transferred at some point to [facility]. Plan for lap appy in am

## 2020-09-05 NOTE — ED Provider Notes (Signed)
[facility] EMERGENCY DEPARTMENT Provider Note   CSN: [id number] Arrival date & time: 09/05/20  1612     History Chief Complaint  Patient presents with  . Abdominal Pain    Brenda Ware is a 54 y.o. female.  Patient with onset of abdominal pain yesterday at around 3 PM.  This morning it was localizing to the right lower quadrant.  Associated with nausea feels very crampy in nature.  Decreased appetite some retching as well.  No fevers.  Past medical history is significant for gastric bypass surgery.  She has not had any of the Covid vaccines        Past Medical History:  Diagnosis Date  . GERD (gastroesophageal reflux disease) 06-17-13   no problems in several months  . 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 Malnutrition following gastrointestinal surgery   . Morbid obesity (HCC)   . Seasonal allergies   . SUI (stress urinary incontinence, female) 06-17-13   no longer an issue s/p bladder sling    Patient Active Problem List   Diagnosis Date Noted  . Appendicitis 09/05/2020  . Rib pain 03/13/2020  . Lymphadenopathy 02/27/2020  . Thyroid mass 02/27/2020  . Dehydration 02/25/2020  . Trigger finger, right ring finger 09/21/2017  . Obesity 11/07/2013  . 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 Obesity, Class III, BMI 40-49.9 (morbid obesity) (HCC) 06/14/2013  . Urinary incontinence, mixed 02/02/2007  . Hyperlipidemia 11/23/2006  . Tobacco use disorder 11/23/2006  . Primary hypertension 11/23/2006  . Hydradenitis 11/23/2006    Past Surgical History:  Procedure Laterality Date  . BLADDER SUSPENSION  04/17/2012   Procedure: [facility] PROCEDURE;  Surgeon: Martina Sinner, MD;  Location: [facility];  Service: Urology;  Laterality: N/A;  . CHOLECYSTECTOMY  1991  . GASTRIC ROUX-EN-Y N/A 01/21/2020   Procedure: Conversion  from Lap Gastric Sleeve to LAPAROSCOPIC ROUX-EN-Y GASTRIC BYPASS WITH UPPER ENDOSCOPY, ERAS Pathway;  Surgeon: Gaynelle Adu, MD;  Location: [facility];  Service: General;  Laterality: N/A;  . HIATAL HERNIA REPAIR N/A 06/24/2013   Procedure: LAPAROSCOPIC REPAIR OF HIATAL HERNIA;  Surgeon: Atilano Ina, MD;  Location: [facility];  Service: General;  Laterality: N/A;  . LAPAROSCOPIC GASTRIC SLEEVE RESECTION N/A 06/24/2013   Procedure: LAPAROSCOPIC GASTRIC SLEEVE RESECTION;  Surgeon: Atilano Ina, MD;  Location: [facility];  Service: General;  Laterality: N/A;  . TUBAL LIGATION  1991  . UPPER GI ENDOSCOPY N/A 06/24/2013   Procedure: UPPER GI ENDOSCOPY;  Surgeon: Atilano Ina, MD;  Location: [facility];  Service: General;  Laterality: N/A;     OB History   No obstetric history on file.     Family History  Problem Relation Age of Onset  . Colon cancer Neg Hx   . Esophageal cancer Neg Hx   . Stomach cancer Neg Hx   . Rectal cancer Neg Hx   . Breast cancer Neg Hx     Social History   Tobacco Use  . Smoking status: Former Smoker    Packs/day: 1.00    Years: 10.00    Pack years: 10.00    Types: Cigarettes    Quit date: 06/27/2019    Years since quitting: 1.1  . Smokeless tobacco: Never Used  Vaping Use  . Vaping Use: Former  Substance Use Topics  . Alcohol use: No  . Drug use:  No    Home Medications Prior to Admission medications   Medication Sig Start Date End Date Taking? Authorizing Provider  CALCIUM PO Take 1 tablet by mouth daily.    [provider]  dexlansoprazole (DEXILANT) 60 MG capsule Take 60 mg by mouth daily before breakfast.     [provider]  diclofenac Sodium (VOLTAREN) 1 % GEL Apply 4 g topically 4 (four) times daily. 03/09/20   Shirley, [place], DO  Multiple Vitamin (MULTIVITAMIN WITH MINERALS) TABS tablet Take 1 tablet by mouth at bedtime.    [provider]  oxyCODONE-acetaminophen (PERCOCET) 5-325 MG tablet Take 1 tablet by mouth every 4 (four)  hours as needed for severe pain. 03/09/20   Shirley, [place], DO    Allergies    Avelox [moxifloxacin hcl in nacl], Contrast media [iodinated diagnostic agents], Doxycycline, and Vicodin [hydrocodone-acetaminophen]  Review of Systems   Review of Systems  Constitutional: Negative for chills and fever.  HENT: Negative for rhinorrhea and sore throat.   Eyes: Negative for visual disturbance.  Respiratory: Negative for cough and shortness of breath.   Cardiovascular: Negative for chest pain and leg swelling.  Gastrointestinal: Positive for abdominal pain, nausea and vomiting. Negative for diarrhea.  Genitourinary: Negative for dysuria.  Musculoskeletal: Negative for back pain and neck pain.  Skin: Negative for rash.  Neurological: Negative for dizziness, light-headedness and headaches.  Hematological: Does not bruise/bleed easily.  Psychiatric/Behavioral: Negative for confusion.    Physical Exam Updated Vital Signs BP (!) 129/91   Pulse 72   Temp 98.7 F (37.1 C) (Oral)   Resp 16   Ht 1.651 m (5\' 5" )   Wt 99.8 kg   SpO2 98%   BMI 36.61 kg/m   Physical Exam Vitals and nursing note reviewed.  Constitutional:      General: She is not in acute distress.    Appearance: Normal appearance. She is well-developed and well-nourished.  HENT:     Head: Normocephalic and atraumatic.  Eyes:     Extraocular Movements: Extraocular movements intact.     Conjunctiva/sclera: Conjunctivae normal.     Pupils: Pupils are equal, round, and reactive to light.  Cardiovascular:     Rate and Rhythm: Normal rate and regular rhythm.     Heart sounds: No murmur heard.   Pulmonary:     Effort: Pulmonary effort is normal. No respiratory distress.     Breath sounds: Normal breath sounds.  Abdominal:     Palpations: Abdomen is soft.     Tenderness: There is abdominal tenderness. There is guarding.     Comments: Tender to palpation right lower quadrant with guarding.  Musculoskeletal:         General: No edema. Normal range of motion.     Cervical back: Neck supple.  Skin:    General: Skin is warm and dry.     Capillary Refill: Capillary refill takes less than 2 seconds.  Neurological:     General: No focal deficit present.     Mental Status: She is alert and oriented to person, place, and time.     Cranial Nerves: No cranial nerve deficit.     Sensory: No sensory deficit.     Motor: No weakness.  Psychiatric:        Mood and Affect: Mood and affect normal.     ED Results / Procedures / Treatments   Labs (all labs ordered are listed, but only abnormal results are displayed) Labs Reviewed  COMPREHENSIVE METABOLIC PANEL -  Abnormal; Notable for the following components:      Result Value   Glucose, Bld 112 (*)    All other components within normal limits  CBC - Abnormal; Notable for the following components:   WBC 13.6 (*)    All other components within normal limits  URINALYSIS, ROUTINE W REFLEX MICROSCOPIC - Abnormal; Notable for the following components:   APPearance CLOUDY (*)    Specific Gravity, Urine >1.030 (*)    All other components within normal limits  RESP PANEL BY RT-PCR (FLU A&B, COVID) ARPGX2  LIPASE, BLOOD  PREGNANCY, URINE    EKG None  Radiology CT Abdomen Pelvis Wo Contrast  Result Date: 09/05/2020 CLINICAL DATA:  Right lower quadrant pain, appendicitis suspected. Nausea. EXAM: CT ABDOMEN AND PELVIS WITHOUT CONTRAST TECHNIQUE: Multidetector CT imaging of the abdomen and pelvis was performed following the standard protocol without IV contrast. COMPARISON:  None. FINDINGS: Lower chest: Minor subsegmental atelectasis in both lung bases. No confluent consolidation. No pleural fluid. Hepatobiliary: No focal liver abnormality is seen. Status post cholecystectomy. No biliary dilatation. Pancreas: No ductal dilatation or inflammation. Spleen: Normal in size without focal abnormality. Splenule anterior inferiorly abutting the splenic flexure of the colon.  Adrenals/Urinary Tract: 16 mm low-density left adrenal nodule consistent with adenoma. 14 mm low-density nodule in the right adrenal gland consistent with adenoma. No hydronephrosis or perinephric edema. 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 No renal calculi. Urinary bladder is unremarkable. Stomach/Bowel: Findings consistent with acute appendicitis as described below. Gastric bypass with small hiatal hernia. The Roux limb is nondilated. The excluded gastric remnant is decompressed. No small bowel obstruction or inflammatory change. Moderate stool burden in the colon. No colonic wall thickening or pericolonic edema. Appendix: Location: Retrocecal Diameter: 14 mm Appendicolith: No Mucosal hyper-enhancement: Not assessed in the absence of IV contrast. Extraluminal gas: No Periappendiceal collection: No. Significant periappendiceal fat stranding and edema most prominent proximally. Vascular/Lymphatic: Moderate aortic atherosclerosis. No aortic aneurysm. Multiple small retroperitoneal and central mesenteric lymph nodes are not enlarged by size criteria. There is no pelvic adenopathy. Reproductive: Uterus is atrophic, normal for age. Left ovary is quiescent. Small calcified density in the right ovary, not well assessed on this noncontrast exam. No suspicious adnexal mass per Other: Right lower quadrant fat stranding related to appendiceal inflammation. No free air or focal fluid collection. Tiny fat containing umbilical hernia. Minimal fat in the left inguinal canal. Musculoskeletal: Degenerative change lumbar spine with degenerative disc disease at L5-S1 and multilevel facet hypertrophy. There are no acute or suspicious osseous abnormalities. IMPRESSION: 1. Uncomplicated acute appendicitis. 2. Gastric bypass anatomy with small hiatal hernia. 3. Incidental bilateral adrenal adenomas. Aortic Atherosclerosis (ICD10-I70.0). Electronically Signed   By: Narda Rutherford M.D.   On: 09/05/2020 20:46     Procedures Procedures (including critical care time)  Medications Ordered in ED Medications  fentaNYL (SUBLIMAZE) injection 50 mcg (50 mcg Nasal Given 09/05/20 1831)  0.9 %  sodium chloride infusion ( Intravenous New Bag/Given 09/05/20 2142)  ondansetron (ZOFRAN-ODT) disintegrating tablet 4 mg (4 mg Oral Given 09/05/20 1830)  ondansetron (ZOFRAN) injection 4 mg (4 mg Intravenous Given 09/05/20 2204)  HYDROmorphone (DILAUDID) injection 0.5 mg (0.5 mg Intravenous Given 09/05/20 2142)  piperacillin-tazobactam (ZOSYN) IVPB 3.375 g (3.375 g Intravenous New Bag/Given 09/05/20 2146)    ED Course  I have reviewed the triage vital signs and the nursing notes.  Pertinent labs & imaging results that were available during my care of the patient were reviewed by me and considered  in my medical decision making (see chart for details).    MDM Rules/Calculators/A&P                          The scans consistent with acute appendicitis without evidence of any perforation.  With Dr. Dwain Sarna from general surgery he will admit patient.  Covid testing is negative.     Final Clinical Impression(s) / ED Diagnoses Final diagnoses:  Acute appendicitis, unspecified acute appendicitis type    Rx / DC Orders ED Discharge Orders    None       Vanetta Mulders, MD 09/05/20 2321

## 2020-09-05 NOTE — ED Notes (Signed)
ED Provider at bedside. 

## 2020-09-05 NOTE — ED Notes (Signed)
PT AND FAMILY INFORMED THAT PT IS NPO UNTIL FURTHER NOTICE

## 2020-09-05 NOTE — ED Notes (Signed)
NPO SINCE 2100HRS, 09-04-2020

## 2020-09-06 ENCOUNTER — Observation Stay (HOSPITAL_COMMUNITY): Payer: BC Managed Care – PPO | Admitting: Anesthesiology

## 2020-09-06 ENCOUNTER — Encounter (HOSPITAL_COMMUNITY): Admission: EM | Disposition: A | Discharge status: Home / Self Care | Payer: Self-pay | Source: Home / Self Care

## 2020-09-06 HISTORY — PX: LAPAROSCOPIC APPENDECTOMY: SHX408

## 2020-09-06 LAB — SURGICAL PCR SCREEN
MRSA, PCR: NEGATIVE
Staphylococcus aureus: NEGATIVE

## 2020-09-06 SURGERY — APPENDECTOMY, LAPAROSCOPIC
Anesthesia: General | Site: Abdomen

## 2020-09-06 MED ORDER — SUGAMMADEX SODIUM 500 MG/5ML IV SOLN
INTRAVENOUS | Status: DC | PRN
  Administered 2020-09-06: 250 mg via INTRAVENOUS

## 2020-09-06 MED ORDER — PANTOPRAZOLE SODIUM 40 MG PO TBEC
40.0000 mg | DELAYED_RELEASE_TABLET | Freq: Every day | ORAL | Status: DC
  Administered 2020-09-06: 14:00:00 40 mg via ORAL
  Filled 2020-09-06: qty 1

## 2020-09-06 MED ORDER — SODIUM CHLORIDE 0.9 % IV SOLN: 250.0000 mL | INTRAVENOUS | Status: DC | PRN

## 2020-09-06 MED ORDER — LIDOCAINE 2% (20 MG/ML) 5 ML SYRINGE
INTRAMUSCULAR | Status: DC | PRN
  Administered 2020-09-06: 1.5 mL/h via INTRAVENOUS

## 2020-09-06 MED ORDER — SUCCINYLCHOLINE CHLORIDE 200 MG/10ML IV SOSY
PREFILLED_SYRINGE | INTRAVENOUS | Status: DC | PRN
  Administered 2020-09-06: 120 mg via INTRAVENOUS

## 2020-09-06 MED ORDER — ACETAMINOPHEN 650 MG RE SUPP
650.0000 mg | RECTAL | Status: DC | PRN
  Filled 2020-09-06: qty 1

## 2020-09-06 MED ORDER — ONDANSETRON HCL 4 MG/2ML IJ SOLN
4.0000 mg | Freq: Four times a day (QID) | INTRAMUSCULAR | Status: DC | PRN
  Administered 2020-09-06: 14:00:00 4 mg via INTRAVENOUS
  Filled 2020-09-06: qty 2

## 2020-09-06 MED ORDER — ACETAMINOPHEN 500 MG PO TABS: 1000.0000 mg | ORAL_TABLET | ORAL | Status: DC

## 2020-09-06 MED ORDER — BUPIVACAINE HCL 0.25 % IJ SOLN
INTRAMUSCULAR | Status: DC | PRN
  Administered 2020-09-06: 12 mL

## 2020-09-06 MED ORDER — OXYCODONE HCL 5 MG PO TABS: 5.0000 mg | ORAL_TABLET | Freq: Four times a day (QID) | ORAL | 0 refills | Status: DC | PRN

## 2020-09-06 MED ORDER — BUPIVACAINE HCL 0.25 % IJ SOLN
INTRAMUSCULAR | Status: AC
  Filled 2020-09-06: qty 1

## 2020-09-06 MED ORDER — LACTATED RINGERS IV SOLN
INTRAVENOUS | Status: DC | PRN
  Administered 2020-09-06: 1000 mL

## 2020-09-06 MED ORDER — FENTANYL CITRATE (PF) 250 MCG/5ML IJ SOLN
INTRAMUSCULAR | Status: AC
  Filled 2020-09-06: qty 5

## 2020-09-06 MED ORDER — HYDROMORPHONE HCL 1 MG/ML IJ SOLN
INTRAMUSCULAR | Status: AC
  Filled 2020-09-06: qty 1

## 2020-09-06 MED ORDER — OXYCODONE-ACETAMINOPHEN 5-325 MG PO TABS
1.0000 | ORAL_TABLET | ORAL | Status: DC | PRN
  Administered 2020-09-06: 1 via ORAL
  Filled 2020-09-06: qty 1

## 2020-09-06 MED ORDER — HYDROMORPHONE HCL 1 MG/ML IJ SOLN
1.0000 mg | INTRAMUSCULAR | Status: DC | PRN
  Administered 2020-09-06 (×2): 1 mg via INTRAVENOUS
  Filled 2020-09-06 (×2): qty 1

## 2020-09-06 MED ORDER — ROCURONIUM BROMIDE 10 MG/ML (PF) SYRINGE
PREFILLED_SYRINGE | INTRAVENOUS | Status: DC | PRN
  Administered 2020-09-06: 30 mg via INTRAVENOUS

## 2020-09-06 MED ORDER — PROPOFOL 10 MG/ML IV BOLUS
INTRAVENOUS | Status: AC
  Filled 2020-09-06: qty 20

## 2020-09-06 MED ORDER — PHENYLEPHRINE 40 MCG/ML (10ML) SYRINGE FOR IV PUSH (FOR BLOOD PRESSURE SUPPORT)
PREFILLED_SYRINGE | INTRAVENOUS | Status: AC
  Filled 2020-09-06: qty 10

## 2020-09-06 MED ORDER — PHENYLEPHRINE HCL (PRESSORS) 10 MG/ML IV SOLN
INTRAVENOUS | Status: DC | PRN
  Administered 2020-09-06 (×3): 120 ug via INTRAVENOUS

## 2020-09-06 MED ORDER — ROCURONIUM BROMIDE 10 MG/ML (PF) SYRINGE
PREFILLED_SYRINGE | INTRAVENOUS | Status: AC
  Filled 2020-09-06: qty 10

## 2020-09-06 MED ORDER — ONDANSETRON 4 MG PO TBDP: 4.0000 mg | ORAL_TABLET | Freq: Four times a day (QID) | ORAL | Status: DC | PRN

## 2020-09-06 MED ORDER — MIDAZOLAM HCL 2 MG/2ML IJ SOLN
INTRAMUSCULAR | Status: AC
  Filled 2020-09-06: qty 2

## 2020-09-06 MED ORDER — PIPERACILLIN-TAZOBACTAM 3.375 G IVPB
3.3750 g | Freq: Three times a day (TID) | INTRAVENOUS | Status: DC
  Administered 2020-09-06 (×2): 3.375 g via INTRAVENOUS
  Filled 2020-09-06: qty 50

## 2020-09-06 MED ORDER — ONDANSETRON HCL 4 MG/2ML IJ SOLN
INTRAMUSCULAR | Status: DC | PRN
  Administered 2020-09-06: 4 mg via INTRAVENOUS

## 2020-09-06 MED ORDER — PROPOFOL 10 MG/ML IV BOLUS
INTRAVENOUS | Status: DC | PRN
  Administered 2020-09-06: 150 mg via INTRAVENOUS

## 2020-09-06 MED ORDER — ACETAMINOPHEN 325 MG PO TABS: 650.0000 mg | ORAL_TABLET | ORAL | Status: DC | PRN

## 2020-09-06 MED ORDER — EPHEDRINE 5 MG/ML INJ
INTRAVENOUS | Status: AC
  Filled 2020-09-06: qty 10

## 2020-09-06 MED ORDER — LACTATED RINGERS IV SOLN: INTRAVENOUS | Status: DC | PRN

## 2020-09-06 MED ORDER — FENTANYL CITRATE (PF) 100 MCG/2ML IJ SOLN
INTRAMUSCULAR | Status: DC | PRN
  Administered 2020-09-06: 50 ug via INTRAVENOUS
  Administered 2020-09-06: 100 ug via INTRAVENOUS
  Administered 2020-09-06 (×2): 50 ug via INTRAVENOUS

## 2020-09-06 MED ORDER — SODIUM CHLORIDE 0.9% FLUSH: 3.0000 mL | INTRAVENOUS | Status: DC | PRN

## 2020-09-06 MED ORDER — ENOXAPARIN SODIUM 40 MG/0.4ML ~~LOC~~ SOLN: 40.0000 mg | SUBCUTANEOUS | Status: DC

## 2020-09-06 MED ORDER — LIDOCAINE 2% (20 MG/ML) 5 ML SYRINGE
INTRAMUSCULAR | Status: DC | PRN
  Administered 2020-09-06: 50 mg via INTRAVENOUS

## 2020-09-06 MED ORDER — ACETAMINOPHEN 650 MG RE SUPP: 650.0000 mg | Freq: Four times a day (QID) | RECTAL | Status: DC | PRN

## 2020-09-06 MED ORDER — SODIUM CHLORIDE 0.9 % IV SOLN: INTRAVENOUS | Status: DC

## 2020-09-06 MED ORDER — SUGAMMADEX SODIUM 500 MG/5ML IV SOLN
INTRAVENOUS | Status: AC
  Filled 2020-09-06: qty 5

## 2020-09-06 MED ORDER — PROPOFOL 10 MG/ML IV BOLUS
INTRAVENOUS | Status: AC
  Filled 2020-09-06: qty 40

## 2020-09-06 MED ORDER — HYDROMORPHONE HCL 1 MG/ML IJ SOLN
0.2500 mg | INTRAMUSCULAR | Status: DC | PRN
  Administered 2020-09-06 (×4): 0.5 mg via INTRAVENOUS

## 2020-09-06 MED ORDER — ACETAMINOPHEN 325 MG PO TABS: 650.0000 mg | ORAL_TABLET | Freq: Four times a day (QID) | ORAL | Status: DC | PRN

## 2020-09-06 MED ORDER — HYDROMORPHONE HCL 1 MG/ML IJ SOLN
0.5000 mg | Freq: Once | INTRAMUSCULAR | Status: AC
  Administered 2020-09-06: 03:00:00 0.5 mg via INTRAVENOUS
  Filled 2020-09-06: qty 1

## 2020-09-06 MED ORDER — OXYCODONE HCL 5 MG PO TABS: 5.0000 mg | ORAL_TABLET | ORAL | Status: DC | PRN

## 2020-09-06 MED ORDER — DEXAMETHASONE SODIUM PHOSPHATE 10 MG/ML IJ SOLN
INTRAMUSCULAR | Status: AC
  Filled 2020-09-06: qty 1

## 2020-09-06 MED ORDER — DEXAMETHASONE SODIUM PHOSPHATE 10 MG/ML IJ SOLN
INTRAMUSCULAR | Status: DC | PRN
  Administered 2020-09-06: 6 mg via INTRAVENOUS

## 2020-09-06 MED ORDER — LIDOCAINE HCL (PF) 2 % IJ SOLN
INTRAMUSCULAR | Status: AC
  Filled 2020-09-06: qty 5

## 2020-09-06 MED ORDER — SUCCINYLCHOLINE CHLORIDE 200 MG/10ML IV SOSY
PREFILLED_SYRINGE | INTRAVENOUS | Status: AC
  Filled 2020-09-06: qty 10

## 2020-09-06 MED ORDER — SODIUM CHLORIDE 0.9 % IV SOLN
INTRAVENOUS | Status: DC | PRN
Start: 2020-09-06 — End: 2020-09-06

## 2020-09-06 MED ORDER — 0.9 % SODIUM CHLORIDE (POUR BTL) OPTIME
TOPICAL | Status: DC | PRN
  Administered 2020-09-06: 09:00:00 1000 mL

## 2020-09-06 MED ORDER — ONDANSETRON HCL 4 MG/2ML IJ SOLN
INTRAMUSCULAR | Status: AC
  Filled 2020-09-06: qty 2

## 2020-09-06 MED ORDER — MORPHINE SULFATE (PF) 4 MG/ML IV SOLN: 1.0000 mg | INTRAVENOUS | Status: DC | PRN

## 2020-09-06 MED ORDER — KETAMINE HCL 10 MG/ML IJ SOLN
INTRAMUSCULAR | Status: DC | PRN
  Administered 2020-09-06: 20 mg via INTRAVENOUS

## 2020-09-06 MED ORDER — EPHEDRINE SULFATE 50 MG/ML IJ SOLN
INTRAMUSCULAR | Status: DC | PRN
  Administered 2020-09-06 (×2): 10 mg via INTRAVENOUS

## 2020-09-06 MED ORDER — KETAMINE HCL 10 MG/ML IJ SOLN
INTRAMUSCULAR | Status: AC
  Filled 2020-09-06: qty 1

## 2020-09-06 MED ORDER — MIDAZOLAM HCL 5 MG/5ML IJ SOLN
INTRAMUSCULAR | Status: DC | PRN
  Administered 2020-09-06: 2 mg via INTRAVENOUS

## 2020-09-06 MED ORDER — SODIUM CHLORIDE 0.9% FLUSH
3.0000 mL | Freq: Two times a day (BID) | INTRAVENOUS | Status: DC
  Administered 2020-09-06: 14:00:00 3 mL via INTRAVENOUS

## 2020-09-06 SURGICAL SUPPLY — 48 items
ADH SKN CLS APL DERMABOND .7 (GAUZE/BANDAGES/DRESSINGS) ×1
APL PRP STRL LF DISP 70% ISPRP (MISCELLANEOUS) ×1
BAG SPEC RTRVL 10 TROC 200 (ENDOMECHANICALS) ×1
CABLE HIGH FREQUENCY MONO STRZ (ELECTRODE) ×3 IMPLANT
CHLORAPREP W/TINT 26 (MISCELLANEOUS) ×3 IMPLANT
COVER SURGICAL LIGHT HANDLE (MISCELLANEOUS) ×3 IMPLANT
COVER WAND RF STERILE (DRAPES) IMPLANT
CUTTER FLEX LINEAR 45M (STAPLE) ×3 IMPLANT
DECANTER SPIKE VIAL GLASS SM (MISCELLANEOUS) ×3 IMPLANT
DERMABOND ADVANCED (GAUZE/BANDAGES/DRESSINGS) ×2
DERMABOND ADVANCED .7 DNX12 (GAUZE/BANDAGES/DRESSINGS) ×1 IMPLANT
DEVICE TROCAR PUNCTURE CLOSURE (ENDOMECHANICALS) ×3 IMPLANT
DRAPE LAPAROSCOPIC ABDOMINAL (DRAPES) ×1 IMPLANT
ELECT REM PT RETURN 15FT ADLT (MISCELLANEOUS) ×3 IMPLANT
GLOVE BIO SURGEON STRL SZ7 (GLOVE) ×6 IMPLANT
GLOVE BIOGEL PI IND STRL 7.0 (GLOVE) ×1 IMPLANT
GLOVE BIOGEL PI IND STRL 7.5 (GLOVE) ×2 IMPLANT
GLOVE BIOGEL PI INDICATOR 7.0 (GLOVE) ×2
GLOVE BIOGEL PI INDICATOR 7.5 (GLOVE) ×4
GOWN STRL REUS W/ TWL XL LVL3 (GOWN DISPOSABLE) ×1 IMPLANT
GOWN STRL REUS W/TWL LRG LVL3 (GOWN DISPOSABLE) ×6 IMPLANT
GOWN STRL REUS W/TWL XL LVL3 (GOWN DISPOSABLE) ×6 IMPLANT
KIT BASIN OR (CUSTOM PROCEDURE TRAY) ×3 IMPLANT
KIT TURNOVER KIT A (KITS) IMPLANT
NS IRRIG 1000ML POUR BTL (IV SOLUTION) ×3 IMPLANT
PENCIL SMOKE EVACUATOR (MISCELLANEOUS) IMPLANT
POUCH RETRIEVAL ECOSAC 10 (ENDOMECHANICALS) ×1 IMPLANT
POUCH RETRIEVAL ECOSAC 10MM (ENDOMECHANICALS) ×3
RELOAD 45 VASCULAR/THIN (ENDOMECHANICALS) IMPLANT
RELOAD STAPLE 45 2.5 WHT GRN (ENDOMECHANICALS) IMPLANT
RELOAD STAPLE 45 3.5 BLU ETS (ENDOMECHANICALS) ×1 IMPLANT
RELOAD STAPLE TA45 3.5 REG BLU (ENDOMECHANICALS) ×3 IMPLANT
SET IRRIG TUBING LAPAROSCOPIC (IRRIGATION / IRRIGATOR) ×3 IMPLANT
SET TUBE SMOKE EVAC HIGH FLOW (TUBING) ×3 IMPLANT
SHEARS HARMONIC ACE PLUS 36CM (ENDOMECHANICALS) ×3 IMPLANT
SLEEVE XCEL OPT CAN 5 100 (ENDOMECHANICALS) ×3 IMPLANT
SOL ANTI FOG 6CC (MISCELLANEOUS) ×1 IMPLANT
SOLUTION ANTI FOG 6CC (MISCELLANEOUS) ×2
SUT MNCRL AB 4-0 PS2 18 (SUTURE) ×3 IMPLANT
SUT VICRYL 0 ENDOLOOP (SUTURE) IMPLANT
SUT VICRYL 0 UR6 27IN ABS (SUTURE) ×3 IMPLANT
TOWEL OR 17X26 10 PK STRL BLUE (TOWEL DISPOSABLE) ×3 IMPLANT
TOWEL OR NON WOVEN STRL DISP B (DISPOSABLE) ×3 IMPLANT
TRAY FOLEY MTR SLVR 14FR STAT (SET/KITS/TRAYS/PACK) ×3 IMPLANT
TRAY FOLEY MTR SLVR 16FR STAT (SET/KITS/TRAYS/PACK) IMPLANT
TRAY LAPAROSCOPIC (CUSTOM PROCEDURE TRAY) ×3 IMPLANT
TROCAR XCEL BLUNT TIP 100MML (ENDOMECHANICALS) ×3 IMPLANT
TROCAR XCEL NON-BLD 5MMX100MML (ENDOMECHANICALS) ×3 IMPLANT

## 2020-09-06 NOTE — Transfer of Care (Signed)
Immediate Anesthesia Transfer of Care Note  Patient: Brenda Ware  Procedure(s) Performed: APPENDECTOMY LAPAROSCOPIC (N/A Abdomen)  Patient Location: PACU  Anesthesia Type:General  Level of Consciousness: awake, alert , oriented and patient cooperative  Airway & Oxygen Therapy: Patient Spontanous Breathing and Patient connected to face mask oxygen  Post-op Assessment: Report given to RN, Post -op Vital signs reviewed and stable and Patient moving all extremities X 4  Post vital signs: stable  Last Vitals:  Vitals Value Taken Time  BP    Temp    Pulse 61 09/06/20 0944  Resp 12 09/06/20 0944  SpO2 100 % 09/06/20 0944  Vitals shown include unvalidated device data.  Last Pain:  Vitals:   09/06/20 0644  TempSrc:   PainSc: 7       Patients Stated Pain Goal: 2 (09/06/20 5183)  Complications: No complications documented.

## 2020-09-06 NOTE — Anesthesia Postprocedure Evaluation (Signed)
Anesthesia Post Note  Patient: Brenda Ware  Procedure(s) Performed: APPENDECTOMY LAPAROSCOPIC (N/A Abdomen)     Patient location during evaluation: PACU Anesthesia Type: General Level of consciousness: awake and alert Pain management: pain level controlled Vital Signs Assessment: post-procedure vital signs reviewed and stable Respiratory status: spontaneous breathing, nonlabored ventilation, respiratory function stable and patient connected to nasal cannula oxygen Cardiovascular status: blood pressure returned to baseline and stable Postop Assessment: no apparent nausea or vomiting Anesthetic complications: no   No complications documented.  Last Vitals:  Vitals:   09/06/20 0945 09/06/20 1015  BP:  110/75  Pulse:  (!) 56  Resp:  20  Temp: (!) 36.4 C   SpO2:  95%    Last Pain:  Vitals:   09/06/20 1015  TempSrc:   PainSc: 5                  Suraj Ramdass,W. EDMOND

## 2020-09-06 NOTE — Op Note (Signed)
Preoperative diagnosis: Acute appendicitis Postoperative diagnosis: Acute suppurative appendicitis Procedure: Laparoscopic appendectomy Surgeon: Dr. Harden Mo Anesthesia: General Estimated blood loss: Minimal Complications: None Drains: None Specimens: Appendix to pathology Sponge needle count was correct at completion Disposition recovery stable condition  Indications: 68 yof with prior history of sleeve gastrectomy converted to bypass, lap chole presents with 36 hours of ab pain primarily rlq.  No n/v, no fevers. Nothing doing at home making it better.  She went to outside er where she was found to have elevated wbc and a ct with appendicitis. We discussed lap appy.   Procedure: After informed consent was obtained the patient was taken to the operating room. She was given antibiotics. SCDs were placed. She was placed under general anesthesia without complication. She was prepped and draped in the standard sterile surgical fashion. A surgical timeout was then performed.  I infiltrated Marcaine in the left upper quadrant.  I then made an incision and inserted a 5 mm optiview trocar without injury or difficulty. The abdomen was theninsufflated the abdomen to 15 mmHg pressure I then inserted two 5 mm trocars in the lower abdomen under direct vision without complication.  I also inserted a 12 mm trocar supraumbilical. Her appendix was noted to be acutely suppurative but not perforated. It was retrocecal and I had to mobilize the right colon to completely visualize it. I then was able to dissect the appendiceal mesentery and divide this with the harmonic scalpel. I took this down to the base of the appendix at the cecum. I divided this with a GIA stapler with a blue load. I then placed this in a retrieval bag and removed it from the abdomen. The staple line was hemostatic. There was no evidence of any injury. I then removed the appendix in the retrieval bag. I removed the 12 mm  trocar. I  placed an additional two 0 Vicryl sutures to completely obliterate the umbilical defect. I then desufflated the abdomen to remove the trocars. I closed these with 4-0 Monocryl and glue. She tolerated this well was extubated and transferred to recovery stable.

## 2020-09-06 NOTE — Anesthesia Preprocedure Evaluation (Addendum)
Anesthesia Evaluation  Patient identified by MRN, date of birth, ID band Patient awake    Reviewed: Allergy & Precautions, H&P , NPO status , Patient's Chart, lab work & pertinent test results  Airway Mallampati: III  TM Distance: >3 FB Neck ROM: Full    Dental no notable dental hx. (+) Teeth Intact, Dental Advisory Given   Pulmonary neg pulmonary ROS, former smoker,    Pulmonary exam normal breath sounds clear to auscultation       Cardiovascular negative cardio ROS   Rhythm:Regular Rate:Normal     Neuro/Psych negative neurological ROS  negative psych ROS   GI/Hepatic Neg liver ROS, GERD  Medicated,  Endo/Other  Morbid obesity  Renal/GU negative Renal ROS  negative genitourinary   Musculoskeletal   Abdominal   Peds  Hematology negative hematology ROS (+)   Anesthesia Other Findings   Reproductive/Obstetrics negative OB ROS                            Anesthesia Physical Anesthesia Plan  ASA: II  Anesthesia Plan: General   Post-op Pain Management:    Induction: Intravenous, Rapid sequence and Cricoid pressure planned  PONV Risk Score and Plan: 4 or greater and Ondansetron, Dexamethasone and Midazolam  Airway Management Planned: Oral ETT  Additional Equipment:   Intra-op Plan:   Post-operative Plan: Extubation in OR  Informed Consent: I have reviewed the patients History and Physical, chart, labs and discussed the procedure including the risks, benefits and alternatives for the proposed anesthesia with the patient or authorized representative who has indicated his/her understanding and acceptance.     Dental advisory given  Plan Discussed with: CRNA  Anesthesia Plan Comments:        Anesthesia Quick Evaluation

## 2020-09-06 NOTE — Discharge Instructions (Signed)
[facility] LAPAROSCOPIC SURGERY: POST OP INSTRUCTIONS  Always review your discharge instruction sheet given to you by the facility where your surgery was performed. IF YOU HAVE DISABILITY OR FAMILY LEAVE FORMS, YOU MUST BRING THEM TO THE OFFICE FOR PROCESSING.   DO NOT GIVE THEM TO YOUR DOCTOR.  1. A prescription for pain medication may be given to you upon discharge.  Take your pain medication as prescribed, if needed.  If narcotic pain medicine is not needed, then you may take acetaminophen (Tylenol), naprosyn (Alleve), or ibuprofen (Advil) as needed. 2. Take your usually prescribed medications unless otherwise directed. 3. If you need a refill on your pain medication, please contact your pharmacy.  They will contact our office to request authorization. Prescriptions will not be filled after 5pm or on week-ends. 4. You should follow a light diet the first few days after arrival home, such as soup and crackers, etc.  Be sure to include lots of fluids daily. 5. Most patients will experience some swelling and bruising in the area of the incisions.  Ice packs will help.  Swelling and bruising can take several days to resolve.  6. It is common to experience some constipation if taking pain medication after surgery.  Increasing fluid intake and taking a stool softener (such as Colace) will usually help or prevent this problem from occurring.  A mild laxative (Milk of Magnesia or Miralax) should be taken according to package instructions if there are no bowel movements after 48 hours. 7. Unless discharge instructions indicate otherwise, you may remove your bandages 48 hours after surgery, and you may shower at that time.  You may have steri-strips (small skin tapes) in place directly over the incision.  These strips should be left on the skin for 7-10 days.  If your surgeon used skin glue on the incision, you may shower in 24 hours.  The glue will flake  off over the next 2-3 weeks.  Any sutures or staples will be removed at the office during your follow-up visit. 8. ACTIVITIES:  You may resume regular (light) daily activities beginning the next day--such as daily self-care, walking, climbing stairs--gradually increasing activities as tolerated.  You may have sexual intercourse when it is comfortable.  Refrain from any heavy lifting or straining until approved by your doctor. a. You may drive when you are no longer taking prescription pain medication, you can comfortably wear a seatbelt, and you can safely maneuver your car and apply brakes. b. RETURN TO WORK:  __________________________________________________________ 9. You should see your doctor in the office for a follow-up appointment approximately 2-3 weeks after your surgery.  Make sure that you call for this appointment within a day or two after you arrive home to insure a convenient appointment time. 10. OTHER INSTRUCTIONS: __________________________________________________________________________________________________________________________ __________________________________________________________________________________________________________________________ WHEN TO CALL YOUR DOCTOR: 1. Fever over 101.0 2. Inability to urinate 3. Continued bleeding from incision. 4. Increased pain, redness, or drainage from the incision. 5. Increasing abdominal pain  The clinic staff is available to answer your questions during regular business hours.  Please don't hesitate to call and ask to speak to one of the nurses for clinical concerns.  If you have a medical emergency, go to the nearest emergency room or call 911.  A surgeon from [facility] is always on call at the hospital. [address], [address], [place], [place]  [address] ? [address] [address], [place], [place]   [address] [phone] ? [phone] ? [phone] Web site: [email or web address]  

## 2020-09-06 NOTE — Plan of Care (Signed)
Reviewed written d/c instructions w pt and husband and all questions answered. Pt states that she has all of her belongings, d/c via w/c

## 2020-09-06 NOTE — Anesthesia Procedure Notes (Signed)
Procedure Name: Intubation Date/Time: 09/06/2020 8:34 AM Performed by: Lissa Morales, CRNA Pre-anesthesia Checklist: Patient identified, Emergency Drugs available, Suction available and Patient being monitored Patient Re-evaluated:Patient Re-evaluated prior to induction Oxygen Delivery Method: Circle system utilized Preoxygenation: Pre-oxygenation with 100% oxygen Induction Type: IV induction, Rapid sequence and Cricoid Pressure applied Laryngoscope Size: Mac, 3 and Glidescope Tube type: Oral Tube size: 7.0 mm Number of attempts: 1 Airway Equipment and Method: Stylet and Oral airway Placement Confirmation: ETT inserted through vocal cords under direct vision,  positive ETCO2 and breath sounds checked- equal and bilateral Secured at: 21 cm Tube secured with: Tape Dental Injury: Teeth and Oropharynx as per pre-operative assessment  Difficulty Due To: Difficulty was anticipated, Difficult Airway- due to large tongue, Difficult Airway- due to reduced neck mobility, Difficult Airway- due to anterior larynx and Difficult Airway- due to limited oral opening

## 2020-09-06 NOTE — H&P (Signed)
Brenda Ware is an 54 y.o. female.   Chief Complaint: ab pain HPI: 46 yof with prior history of sleeve gastrectomy converted to bypass, lap chole presents with 36 hours of ab pain primarily rlq.  No n/v, no fevers. Nothing doing at home making it better.  She went to outside er where she was found to have elevated wbc and a ct with appendicitis. She was transferred here  Past Medical History:  Diagnosis Date  . GERD (gastroesophageal reflux disease) 06-17-13   no problems in several months  . 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 Malnutrition following gastrointestinal surgery   . Morbid obesity (HCC)   . Seasonal allergies   . SUI (stress urinary incontinence, female) 06-17-13   no longer an issue s/p bladder sling    Past Surgical History:  Procedure Laterality Date  . BLADDER SUSPENSION  04/17/2012   Procedure: [facility] PROCEDURE;  Surgeon: Martina Sinner, MD;  Location: [facility];  Service: Urology;  Laterality: N/A;  . CHOLECYSTECTOMY  1991  . GASTRIC ROUX-EN-Y N/A 01/21/2020   Procedure: Conversion from Lap Gastric Sleeve to LAPAROSCOPIC ROUX-EN-Y GASTRIC BYPASS WITH UPPER ENDOSCOPY, ERAS Pathway;  Surgeon: Gaynelle Adu, MD;  Location: [facility];  Service: General;  Laterality: N/A;  . HIATAL HERNIA REPAIR N/A 06/24/2013   Procedure: LAPAROSCOPIC REPAIR OF HIATAL HERNIA;  Surgeon: Atilano Ina, MD;  Location: [facility];  Service: General;  Laterality: N/A;  . LAPAROSCOPIC GASTRIC SLEEVE RESECTION N/A 06/24/2013   Procedure: LAPAROSCOPIC GASTRIC SLEEVE RESECTION;  Surgeon: Atilano Ina, MD;  Location: [facility];  Service: General;  Laterality: N/A;  . TUBAL LIGATION  1991  . UPPER GI ENDOSCOPY N/A 06/24/2013   Procedure: UPPER GI ENDOSCOPY;  Surgeon: Atilano Ina, MD;  Location: [facility];  Service: General;  Laterality: N/A;    Family History  Problem Relation Age of Onset  . Colon  cancer Neg Hx   . Esophageal cancer Neg Hx   . Stomach cancer Neg Hx   . Rectal cancer Neg Hx   . Breast cancer Neg Hx    Social History:  reports that she quit smoking about 14 months ago. Her smoking use included cigarettes. She has a 10.00 pack-year smoking history. She has never used smokeless tobacco. She reports that she does not drink alcohol and does not use drugs.  Allergies:  Allergies  Allergen Reactions  . Avelox [Moxifloxacin Hcl In Nacl] Other (See Comments)    Causes throat and tongue to swell  . Contrast Media [Iodinated Diagnostic Agents] Hives, Itching and Rash  . Doxycycline Other (See Comments)    GI UPSET  . Vicodin [Hydrocodone-Acetaminophen] Other (See Comments)    GI UPSET    Medications Prior to Admission  Medication Sig Dispense Refill  . CALCIUM PO Take 1 tablet by mouth daily.    Marland Kitchen dexlansoprazole (DEXILANT) 60 MG capsule Take 60 mg by mouth daily before breakfast.     . diclofenac Sodium (VOLTAREN) 1 % GEL Apply 4 g topically 4 (four) times daily. 150 g 1  . Multiple Vitamin (MULTIVITAMIN WITH MINERALS) TABS tablet Take 1 tablet by mouth at bedtime.    Marland Kitchen oxyCODONE-acetaminophen (PERCOCET) 5-325 MG tablet Take 1 tablet by mouth every 4 (four) hours as needed for severe pain. 5 tablet 0    Results for orders placed or performed during the hospital encounter of 09/05/20 (  from the past 48 hour(s))  Lipase, blood     Status: None   Collection Time: 09/05/20  4:34 PM  Result Value Ref Range   Lipase 26 11 - 51 U/L    Comment: Performed at [facility] [place], [address] [place], [place] [address]  Comprehensive metabolic panel     Status: Abnormal   Collection Time: 09/05/20  4:34 PM  Result Value Ref Range   Sodium 138 135 - 145 mmol/L   Potassium 3.9 3.5 - 5.1 mmol/L   Chloride 102 98 - 111 mmol/L   CO2 24 22 - 32 mmol/L   Glucose, Bld 112 (H) 70 - 99 mg/dL    Comment: Glucose reference range applies only to samples taken after fasting for  at least 8 hours.   BUN 15 6 - 20 mg/dL   Creatinine, Ser 2.950.80 0.44 - 1.00 mg/dL   Calcium 9.4 8.9 - 62.110.3 mg/dL   Total Protein 7.9 6.5 - 8.1 g/dL   Albumin 4.2 3.5 - 5.0 g/dL   AST 28 15 - 41 U/L   ALT 34 0 - 44 U/L   Alkaline Phosphatase 80 38 - 126 U/L   Total Bilirubin 0.6 0.3 - 1.2 mg/dL   GFR, Estimated >30>60 >86>60 mL/min    Comment: (NOTE) Calculated using the CKD-EPI Creatinine Equation (2021)    Anion gap 12 5 - 15    Comment: Performed at [facility] [place], [address] [place], [place] [address]  CBC     Status: Abnormal   Collection Time: 09/05/20  4:34 PM  Result Value Ref Range   WBC 13.6 (H) 4.0 - 10.5 K/uL   RBC 4.85 3.87 - 5.11 MIL/uL   Hemoglobin 14.7 12.0 - 15.0 g/dL   HCT 96.243.4 95.236.0 - 84.146.0 %   MCV 89.5 80.0 - 100.0 fL   MCH 30.3 26.0 - 34.0 pg   MCHC 33.9 30.0 - 36.0 g/dL   RDW 32.412.2 40.111.5 - 02.715.5 %   Platelets 376 150 - 400 K/uL   nRBC 0.0 0.0 - 0.2 %    Comment: Performed at [facility] [place], [address] [place], [place] [address]  Urinalysis, Routine w reflex microscopic Urine, Clean Catch     Status: Abnormal   Collection Time: 09/05/20  4:34 PM  Result Value Ref Range   Color, Urine YELLOW YELLOW    Comment: LESS THAN 10 mL OF URINE SUBMITTED   APPearance CLOUDY (A) CLEAR   Specific Gravity, Urine >1.030 (H) 1.005 - 1.030   pH 5.5 5.0 - 8.0   Glucose, UA NEGATIVE NEGATIVE mg/dL   Hgb urine dipstick NEGATIVE NEGATIVE   Bilirubin Urine NEGATIVE NEGATIVE   Ketones, ur NEGATIVE NEGATIVE mg/dL   Protein, ur NEGATIVE NEGATIVE mg/dL   Nitrite NEGATIVE NEGATIVE   Leukocytes,Ua NEGATIVE NEGATIVE    Comment: Microscopic not done on urines with negative protein, blood, leukocytes, nitrite, or glucose < 500 mg/dL. Performed at [facility] [place], [address] [place], [place] [address]   Pregnancy, urine     Status: None   Collection Time: 09/05/20  4:34 PM  Result Value Ref Range   Preg Test, Ur NEGATIVE NEGATIVE    Comment:         THE SENSITIVITY OF THIS METHODOLOGY IS >20 mIU/mL. Performed at [facility] [place], [address] [place], [place] [address]   Resp Panel by RT-PCR (Flu A&B, Covid) Nasopharyngeal  Swab     Status: None   Collection Time: 09/05/20  9:55 PM   Specimen: Nasopharyngeal Swab; Nasopharyngeal(NP) swabs in vial transport medium  Result Value Ref Range   SARS Coronavirus 2 by RT PCR NEGATIVE NEGATIVE    Comment: (NOTE) SARS-CoV-2 target nucleic acids are NOT DETECTED.  The SARS-CoV-2 RNA is generally detectable in upper respiratory specimens during the acute phase of infection. The lowest concentration of SARS-CoV-2 viral copies this assay can detect is 138 copies/mL. A negative result does not preclude SARS-Cov-2 infection and should not be used as the sole basis for treatment or other patient management decisions. A negative result may occur with  improper specimen collection/handling, submission of specimen other than nasopharyngeal swab, presence of viral mutation(s) within the areas targeted by this assay, and inadequate number of viral copies(<138 copies/mL). A negative result must be combined with clinical observations, patient history, and epidemiological information. The expected result is Negative.  Fact Sheet for Patients:  BloggerCourse.com  Fact Sheet for Healthcare Providers:  SeriousBroker.it  This test is no t yet approved or cleared by the [place] FDA and  has been authorized for detection and/or diagnosis of SARS-CoV-2 by FDA under an Emergency Use Authorization (EUA). This EUA will remain  in effect (meaning this test can be used) for the duration of the COVID-19 declaration under Section 564(b)(1) of the Act, 21 U.S.C.section 360bbb-3(b)(1), unless the authorization is terminated  or revoked sooner.       Influenza A by PCR NEGATIVE NEGATIVE   Influenza B by PCR NEGATIVE NEGATIVE    Comment:  (NOTE) The Xpert Xpress SARS-CoV-2/FLU/RSV plus assay is intended as an aid in the diagnosis of influenza from Nasopharyngeal swab specimens and should not be used as a sole basis for treatment. Nasal washings and aspirates are unacceptable for Xpert Xpress SARS-CoV-2/FLU/RSV testing.  Fact Sheet for Patients: BloggerCourse.com  Fact Sheet for Healthcare Providers: SeriousBroker.it  This test is not yet approved or cleared by the [place] FDA and has been authorized for detection and/or diagnosis of SARS-CoV-2 by FDA under an Emergency Use Authorization (EUA). This EUA will remain in effect (meaning this test can be used) for the duration of the COVID-19 declaration under Section 564(b)(1) of the Act, 21 U.S.C. section 360bbb-3(b)(1), unless the authorization is terminated or revoked.  Performed at [facility], [name], [place], [place] [address]    CT Abdomen Pelvis Wo Contrast  Result Date: 09/05/2020 CLINICAL DATA:  Right lower quadrant pain, appendicitis suspected. Nausea. EXAM: CT ABDOMEN AND PELVIS WITHOUT CONTRAST TECHNIQUE: Multidetector CT imaging of the abdomen and pelvis was performed following the standard protocol without IV contrast. COMPARISON:  None. FINDINGS: Lower chest: Minor subsegmental atelectasis in both lung bases. No confluent consolidation. No pleural fluid. Hepatobiliary: No focal liver abnormality is seen. Status post cholecystectomy. No biliary dilatation. Pancreas: No ductal dilatation or inflammation. Spleen: Normal in size without focal abnormality. Splenule anterior inferiorly abutting the splenic flexure of the colon. Adrenals/Urinary Tract: 16 mm low-density left adrenal nodule consistent with adenoma. 14 mm low-density nodule in the right adrenal gland consistent with adenoma. No hydronephrosis or perinephric edema. 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 No renal  calculi. Urinary bladder is unremarkable. Stomach/Bowel: Findings consistent with acute appendicitis as described below. Gastric bypass with small hiatal hernia. The Roux limb is nondilated. The excluded gastric remnant is decompressed. No small bowel obstruction or inflammatory change. Moderate stool burden in the colon. No colonic wall thickening  or pericolonic edema. Appendix: Location: Retrocecal Diameter: 14 mm Appendicolith: No Mucosal hyper-enhancement: Not assessed in the absence of IV contrast. Extraluminal gas: No Periappendiceal collection: No. Significant periappendiceal fat stranding and edema most prominent proximally. Vascular/Lymphatic: Moderate aortic atherosclerosis. No aortic aneurysm. Multiple small retroperitoneal and central mesenteric lymph nodes are not enlarged by size criteria. There is no pelvic adenopathy. Reproductive: Uterus is atrophic, normal for age. Left ovary is quiescent. Small calcified density in the right ovary, not well assessed on this noncontrast exam. No suspicious adnexal mass per Other: Right lower quadrant fat stranding related to appendiceal inflammation. No free air or focal fluid collection. Tiny fat containing umbilical hernia. Minimal fat in the left inguinal canal. Musculoskeletal: Degenerative change lumbar spine with degenerative disc disease at L5-S1 and multilevel facet hypertrophy. There are no acute or suspicious osseous abnormalities. IMPRESSION: 1. Uncomplicated acute appendicitis. 2. Gastric bypass anatomy with small hiatal hernia. 3. Incidental bilateral adrenal adenomas. Aortic Atherosclerosis (ICD10-I70.0). Electronically Signed   By: Narda Rutherford M.D.   On: 09/05/2020 20:46    Review of Systems  Respiratory: Negative for shortness of breath.   Cardiovascular: Negative for chest pain.  Gastrointestinal: Positive for abdominal pain. Negative for nausea and vomiting.  All other systems reviewed and are negative.   Blood pressure 112/66,  pulse 63, temperature 98.5 F (36.9 C), temperature source Oral, resp. rate 18, height 5\' 5"  (1.651 m), weight 99.8 kg, SpO2 96 %. Physical Exam Constitutional:      Appearance: She is well-developed.  Eyes:     General: No scleral icterus.    Extraocular Movements: Extraocular movements intact.  Cardiovascular:     Rate and Rhythm: Normal rate and regular rhythm.  Pulmonary:     Effort: Pulmonary effort is normal.     Breath sounds: Normal breath sounds.  Abdominal:     General: A surgical scar is present. Bowel sounds are normal. There is no distension.     Palpations: Abdomen is soft.     Tenderness: There is abdominal tenderness in the right lower quadrant.     Hernia: No hernia is present.  Skin:    General: Skin is warm and dry.     Capillary Refill: Capillary refill takes less than 2 seconds.  Neurological:     General: No focal deficit present.     Mental Status: She is alert.  Psychiatric:        Mood and Affect: Mood normal.        Behavior: Behavior normal.      Assessment/Plan Acute appendicitis -discussed proceeding to OR this am for lap appy.. discussed surgery, risks and recovery with her. -hopefully can dc home later today  , MD 09/06/2020, 7:29 AM

## 2020-09-07 ENCOUNTER — Encounter (HOSPITAL_COMMUNITY): Payer: Self-pay | Admitting: General Surgery

## 2020-09-07 NOTE — Discharge Summary (Signed)
Physician Discharge Summary  Patient ID: Brenda Ware MRN: [id number] DOB/AGE: May 13, 1966 54 y.o.  Admit date: 09/05/2020 Discharge date: 09/07/2020  Admission Diagnoses: Acute appendicitis  Discharge Diagnoses:  Active Problems:   Appendicitis   Discharged Condition: good  Hospital Course: 58 yof s/p prior lap sleeve converted to bypass admitted with acute appendicitis. Underwent uncomplicated lap appy and discharged same day.  Consults: None  Significant Diagnostic Studies: none  Treatments: surgery: lap appy    Disposition: Discharge disposition: 01-Home or Self Care       Discharge Instructions    Diet - low sodium heart healthy   Complete by: As directed    Increase activity slowly   Complete by: As directed      Allergies as of 09/06/2020      Reactions   Avelox [moxifloxacin Hcl In Nacl] Other (See Comments)   Causes throat and tongue to swell   Contrast Media [iodinated Diagnostic Agents] Hives, Itching, Rash   Doxycycline Other (See Comments)   GI UPSET   Vicodin [hydrocodone-acetaminophen] Other (See Comments)   GI UPSET      Medication List    TAKE these medications   CALCIUM PO Take 1 tablet by mouth daily.   Dexlansoprazole 30 MG capsule Take 30 mg by mouth daily before breakfast.   diclofenac Sodium 1 % Gel Commonly known as: Voltaren Apply 4 g topically 4 (four) times daily.   multivitamin with minerals Tabs tablet Take 1 tablet by mouth at bedtime.   oxyCODONE 5 MG immediate release tablet Commonly known as: Oxy IR/ROXICODONE Take 1 tablet (5 mg total) by mouth every 6 (six) hours as needed for moderate pain, severe pain or breakthrough pain.   oxyCODONE-acetaminophen 5-325 MG tablet Commonly known as: Percocet Take 1 tablet by mouth every 4 (four) hours as needed for severe pain.       Follow-up Information    [facility] In 2 weeks.   Specialty: General Surgery Contact information: [address] [place] [address] [phone]              Signed: Emelia Loron 09/07/2020, 8:41 AM

## 2020-09-08 LAB — SURGICAL PATHOLOGY

## 2020-09-09 ENCOUNTER — Other Ambulatory Visit: Payer: Self-pay | Admitting: General Surgery

## 2020-09-11 ENCOUNTER — Other Ambulatory Visit: Payer: Self-pay

## 2020-09-11 ENCOUNTER — Ambulatory Visit
Admission: RE | Admit: 2020-09-11 | Discharge: 2020-09-11 | Disposition: A | Discharge status: Home / Self Care | Payer: BC Managed Care – PPO | Source: Ambulatory Visit | Attending: General Surgery | Admitting: General Surgery

## 2020-09-11 DIAGNOSIS — E860 Dehydration: Secondary | ICD-10-CM | POA: Diagnosis present

## 2020-09-11 MED ORDER — THIAMINE HCL 100 MG/ML IJ SOLN
Freq: Once | INTRAVENOUS | Status: AC
  Filled 2020-09-11: qty 1000

## 2020-09-11 MED ORDER — SODIUM CHLORIDE 0.9 % IV SOLN: INTRAVENOUS | Status: DC

## 2020-09-12 ENCOUNTER — Encounter (HOSPITAL_COMMUNITY): Admission: EM | Disposition: A | Discharge status: Home / Self Care | Payer: Self-pay | Source: Home / Self Care

## 2020-09-12 ENCOUNTER — Inpatient Hospital Stay (HOSPITAL_COMMUNITY): Payer: BC Managed Care – PPO | Admitting: Certified Registered Nurse Anesthetist

## 2020-09-12 ENCOUNTER — Emergency Department (HOSPITAL_COMMUNITY): Payer: BC Managed Care – PPO

## 2020-09-12 ENCOUNTER — Inpatient Hospital Stay (HOSPITAL_COMMUNITY)
Admission: EM | Admit: 2020-09-12 | Discharge: 2020-09-14 | DRG: 908 | Disposition: A | Discharge status: Home / Self Care | Payer: BC Managed Care – PPO | Attending: Internal Medicine | Admitting: Internal Medicine

## 2020-09-12 ENCOUNTER — Encounter (HOSPITAL_COMMUNITY): Payer: Self-pay | Admitting: Emergency Medicine

## 2020-09-12 DIAGNOSIS — Z91041 Radiographic dye allergy status: Secondary | ICD-10-CM | POA: Diagnosis not present

## 2020-09-12 DIAGNOSIS — Z9049 Acquired absence of other specified parts of digestive tract: Secondary | ICD-10-CM | POA: Diagnosis not present

## 2020-09-12 DIAGNOSIS — Z87891 Personal history of nicotine dependence: Secondary | ICD-10-CM

## 2020-09-12 DIAGNOSIS — Z885 Allergy status to narcotic agent status: Secondary | ICD-10-CM | POA: Diagnosis not present

## 2020-09-12 DIAGNOSIS — K219 Gastro-esophageal reflux disease without esophagitis: Secondary | ICD-10-CM | POA: Diagnosis present

## 2020-09-12 DIAGNOSIS — K430 Incisional hernia with obstruction, without gangrene: Secondary | ICD-10-CM

## 2020-09-12 DIAGNOSIS — T8132XA Disruption of internal operation (surgical) wound, not elsewhere classified, initial encounter: Secondary | ICD-10-CM | POA: Diagnosis present

## 2020-09-12 DIAGNOSIS — Z6836 Body mass index (BMI) 36.0-36.9, adult: Secondary | ICD-10-CM

## 2020-09-12 DIAGNOSIS — K9189 Other postprocedural complications and disorders of digestive system: Secondary | ICD-10-CM | POA: Diagnosis present

## 2020-09-12 DIAGNOSIS — Y838 Other surgical procedures as the cause of abnormal reaction of the patient, or of later complication, without mention of misadventure at the time of the procedure: Secondary | ICD-10-CM | POA: Diagnosis present

## 2020-09-12 DIAGNOSIS — Z86001 Personal history of in-situ neoplasm of cervix uteri: Secondary | ICD-10-CM

## 2020-09-12 DIAGNOSIS — Z419 Encounter for procedure for purposes other than remedying health state, unspecified: Secondary | ICD-10-CM

## 2020-09-12 DIAGNOSIS — K436 Other and unspecified ventral hernia with obstruction, without gangrene: Secondary | ICD-10-CM

## 2020-09-12 DIAGNOSIS — K56609 Unspecified intestinal obstruction, unspecified as to partial versus complete obstruction: Secondary | ICD-10-CM

## 2020-09-12 DIAGNOSIS — E669 Obesity, unspecified: Secondary | ICD-10-CM | POA: Diagnosis present

## 2020-09-12 DIAGNOSIS — Z881 Allergy status to other antibiotic agents status: Secondary | ICD-10-CM

## 2020-09-12 DIAGNOSIS — Z20822 Contact with and (suspected) exposure to covid-19: Secondary | ICD-10-CM | POA: Diagnosis present

## 2020-09-12 DIAGNOSIS — K432 Incisional hernia without obstruction or gangrene: Secondary | ICD-10-CM | POA: Diagnosis present

## 2020-09-12 HISTORY — PX: LAPAROTOMY: SHX154

## 2020-09-12 LAB — CBC
HCT: 38.9 % (ref 36.0–46.0)
Hemoglobin: 13.1 g/dL (ref 12.0–15.0)
MCH: 30.2 pg (ref 26.0–34.0)
MCHC: 33.7 g/dL (ref 30.0–36.0)
MCV: 89.6 fL (ref 80.0–100.0)
Platelets: 409 10*3/uL — ABNORMAL HIGH (ref 150–400)
RBC: 4.34 MIL/uL (ref 3.87–5.11)
RDW: 12.3 % (ref 11.5–15.5)
WBC: 11.1 10*3/uL — ABNORMAL HIGH (ref 4.0–10.5)
nRBC: 0 % (ref 0.0–0.2)

## 2020-09-12 LAB — COMPREHENSIVE METABOLIC PANEL
ALT: 92 U/L — ABNORMAL HIGH (ref 0–44)
AST: 91 U/L — ABNORMAL HIGH (ref 15–41)
Albumin: 3.5 g/dL (ref 3.5–5.0)
Alkaline Phosphatase: 83 U/L (ref 38–126)
Anion gap: 12 (ref 5–15)
BUN: 12 mg/dL (ref 6–20)
CO2: 22 mmol/L (ref 22–32)
Calcium: 8.6 mg/dL — ABNORMAL LOW (ref 8.9–10.3)
Chloride: 103 mmol/L (ref 98–111)
Creatinine, Ser: 0.74 mg/dL (ref 0.44–1.00)
GFR, Estimated: >60 mL/min (ref >60)
Glucose, Bld: 119 mg/dL — ABNORMAL HIGH (ref 70–99)
Potassium: 3.6 mmol/L (ref 3.5–5.1)
Sodium: 137 mmol/L (ref 135–145)
Total Bilirubin: 0.6 mg/dL (ref 0.3–1.2)
Total Protein: 6.5 g/dL (ref 6.5–8.1)

## 2020-09-12 LAB — I-STAT BETA HCG BLOOD, ED (MC, WL, AP ONLY): I-stat hCG, quantitative: 6.1 m[IU]/mL — ABNORMAL HIGH (ref <5)

## 2020-09-12 LAB — HIV ANTIBODY (ROUTINE TESTING W REFLEX): HIV Screen 4th Generation wRfx: NONREACTIVE

## 2020-09-12 LAB — RESP PANEL BY RT-PCR (FLU A&B, COVID) ARPGX2
Influenza A by PCR: NEGATIVE
Influenza B by PCR: NEGATIVE
SARS Coronavirus 2 by RT PCR: NEGATIVE

## 2020-09-12 LAB — LIPASE, BLOOD: Lipase: 27 U/L (ref 11–51)

## 2020-09-12 SURGERY — LAPAROTOMY, EXPLORATORY
Anesthesia: General | Site: Abdomen

## 2020-09-12 MED ORDER — PROPOFOL 10 MG/ML IV BOLUS
INTRAVENOUS | Status: AC
  Filled 2020-09-12: qty 20

## 2020-09-12 MED ORDER — PANTOPRAZOLE SODIUM 40 MG PO TBEC
40.0000 mg | DELAYED_RELEASE_TABLET | Freq: Every day | ORAL | Status: DC
  Administered 2020-09-12 – 2020-09-14 (×3): 40 mg via ORAL
  Filled 2020-09-12 (×3): qty 1

## 2020-09-12 MED ORDER — BUPIVACAINE-EPINEPHRINE (PF) 0.25% -1:200000 IJ SOLN
INTRAMUSCULAR | Status: DC | PRN
  Administered 2020-09-12: 20 mL

## 2020-09-12 MED ORDER — LIDOCAINE 2% (20 MG/ML) 5 ML SYRINGE
INTRAMUSCULAR | Status: DC | PRN
  Administered 2020-09-12: 50 mg via INTRAVENOUS

## 2020-09-12 MED ORDER — AMISULPRIDE (ANTIEMETIC) 5 MG/2ML IV SOLN: 10.0000 mg | Freq: Once | INTRAVENOUS | Status: DC | PRN

## 2020-09-12 MED ORDER — FENTANYL CITRATE (PF) 100 MCG/2ML IJ SOLN: 50.0000 ug | INTRAMUSCULAR | Status: DC | PRN

## 2020-09-12 MED ORDER — ONDANSETRON 4 MG PO TBDP
4.0000 mg | ORAL_TABLET | Freq: Four times a day (QID) | ORAL | Status: DC | PRN
  Filled 2020-09-12: qty 1

## 2020-09-12 MED ORDER — SUGAMMADEX SODIUM 200 MG/2ML IV SOLN
INTRAVENOUS | Status: DC | PRN
  Administered 2020-09-12: 200 mg via INTRAVENOUS

## 2020-09-12 MED ORDER — ONDANSETRON HCL 4 MG/2ML IJ SOLN
INTRAMUSCULAR | Status: AC
  Filled 2020-09-12: qty 2

## 2020-09-12 MED ORDER — DEXTROSE-NACL 5-0.9 % IV SOLN: INTRAVENOUS | Status: DC

## 2020-09-12 MED ORDER — ONDANSETRON HCL 4 MG/2ML IJ SOLN
4.0000 mg | Freq: Four times a day (QID) | INTRAMUSCULAR | Status: DC | PRN
  Administered 2020-09-12 – 2020-09-14 (×2): 4 mg via INTRAVENOUS
  Filled 2020-09-12 (×2): qty 2

## 2020-09-12 MED ORDER — DIAZEPAM 5 MG/ML IJ SOLN
2.5000 mg | Freq: Once | INTRAMUSCULAR | Status: DC
  Filled 2020-09-12: qty 2

## 2020-09-12 MED ORDER — PHENYLEPHRINE 40 MCG/ML (10ML) SYRINGE FOR IV PUSH (FOR BLOOD PRESSURE SUPPORT)
PREFILLED_SYRINGE | INTRAVENOUS | Status: AC
  Filled 2020-09-12: qty 10

## 2020-09-12 MED ORDER — EPHEDRINE 5 MG/ML INJ
INTRAVENOUS | Status: AC
  Filled 2020-09-12: qty 10

## 2020-09-12 MED ORDER — FENTANYL CITRATE (PF) 100 MCG/2ML IJ SOLN
INTRAMUSCULAR | Status: DC | PRN
  Administered 2020-09-12: 100 ug via INTRAVENOUS

## 2020-09-12 MED ORDER — SODIUM CHLORIDE 0.9 % IV SOLN
2.0000 g | INTRAVENOUS | Status: AC
  Administered 2020-09-12: 11:00:00 2 g via INTRAVENOUS
  Filled 2020-09-12: qty 2

## 2020-09-12 MED ORDER — OXYCODONE HCL 5 MG PO TABS
5.0000 mg | ORAL_TABLET | ORAL | Status: DC | PRN
  Administered 2020-09-13: 5 mg via ORAL
  Administered 2020-09-13: 10 mg via ORAL
  Filled 2020-09-12: qty 1
  Filled 2020-09-12: qty 2

## 2020-09-12 MED ORDER — ONDANSETRON 4 MG PO TBDP: 4.0000 mg | ORAL_TABLET | Freq: Four times a day (QID) | ORAL | Status: DC | PRN

## 2020-09-12 MED ORDER — ROCURONIUM BROMIDE 10 MG/ML (PF) SYRINGE
PREFILLED_SYRINGE | INTRAVENOUS | Status: DC | PRN
  Administered 2020-09-12: 50 mg via INTRAVENOUS

## 2020-09-12 MED ORDER — PROPOFOL 10 MG/ML IV BOLUS
INTRAVENOUS | Status: DC | PRN
  Administered 2020-09-12: 200 mg via INTRAVENOUS

## 2020-09-12 MED ORDER — HYDROMORPHONE HCL 1 MG/ML IJ SOLN
0.5000 mg | INTRAMUSCULAR | Status: DC | PRN
  Administered 2020-09-12 – 2020-09-13 (×3): 1 mg via INTRAVENOUS
  Filled 2020-09-12 (×3): qty 1

## 2020-09-12 MED ORDER — KCL IN DEXTROSE-NACL 20-5-0.9 MEQ/L-%-% IV SOLN
INTRAVENOUS | Status: DC
  Filled 2020-09-12: qty 1000

## 2020-09-12 MED ORDER — KETOROLAC TROMETHAMINE 30 MG/ML IJ SOLN
INTRAMUSCULAR | Status: AC
  Filled 2020-09-12: qty 1

## 2020-09-12 MED ORDER — METOCLOPRAMIDE HCL 5 MG/ML IJ SOLN
10.0000 mg | Freq: Once | INTRAMUSCULAR | Status: AC
  Administered 2020-09-12: 03:00:00 10 mg via INTRAVENOUS
  Filled 2020-09-12: qty 2

## 2020-09-12 MED ORDER — DEXAMETHASONE SODIUM PHOSPHATE 10 MG/ML IJ SOLN
INTRAMUSCULAR | Status: DC | PRN
  Administered 2020-09-12: 10 mg via INTRAVENOUS

## 2020-09-12 MED ORDER — FENTANYL CITRATE (PF) 250 MCG/5ML IJ SOLN
INTRAMUSCULAR | Status: AC
  Filled 2020-09-12: qty 5

## 2020-09-12 MED ORDER — ONDANSETRON HCL 4 MG/2ML IJ SOLN: 4.0000 mg | Freq: Four times a day (QID) | INTRAMUSCULAR | Status: DC | PRN

## 2020-09-12 MED ORDER — ROCURONIUM BROMIDE 10 MG/ML (PF) SYRINGE
PREFILLED_SYRINGE | INTRAVENOUS | Status: AC
  Filled 2020-09-12: qty 10

## 2020-09-12 MED ORDER — SUCCINYLCHOLINE CHLORIDE 200 MG/10ML IV SOSY
PREFILLED_SYRINGE | INTRAVENOUS | Status: DC | PRN
  Administered 2020-09-12: 120 mg via INTRAVENOUS

## 2020-09-12 MED ORDER — MIDAZOLAM HCL 2 MG/2ML IJ SOLN
INTRAMUSCULAR | Status: AC
  Filled 2020-09-12: qty 2

## 2020-09-12 MED ORDER — SODIUM CHLORIDE 0.9 % IV BOLUS (SEPSIS)
1000.0000 mL | Freq: Once | INTRAVENOUS | Status: AC
  Administered 2020-09-12: 03:00:00 1000 mL via INTRAVENOUS

## 2020-09-12 MED ORDER — FENTANYL CITRATE (PF) 100 MCG/2ML IJ SOLN
25.0000 ug | INTRAMUSCULAR | Status: DC | PRN
  Administered 2020-09-12 (×2): 50 ug via INTRAVENOUS

## 2020-09-12 MED ORDER — 0.9 % SODIUM CHLORIDE (POUR BTL) OPTIME
TOPICAL | Status: DC | PRN
  Administered 2020-09-12: 11:00:00 1000 mL

## 2020-09-12 MED ORDER — DEXAMETHASONE SODIUM PHOSPHATE 10 MG/ML IJ SOLN
INTRAMUSCULAR | Status: AC
  Filled 2020-09-12: qty 1

## 2020-09-12 MED ORDER — SUCCINYLCHOLINE CHLORIDE 200 MG/10ML IV SOSY
PREFILLED_SYRINGE | INTRAVENOUS | Status: AC
  Filled 2020-09-12: qty 20

## 2020-09-12 MED ORDER — ENOXAPARIN SODIUM 40 MG/0.4ML ~~LOC~~ SOLN
40.0000 mg | SUBCUTANEOUS | Status: DC
  Administered 2020-09-13: 09:00:00 40 mg via SUBCUTANEOUS
  Filled 2020-09-12: qty 0.4

## 2020-09-12 MED ORDER — FENTANYL CITRATE (PF) 100 MCG/2ML IJ SOLN
INTRAMUSCULAR | Status: AC
  Filled 2020-09-12: qty 2

## 2020-09-12 MED ORDER — ONDANSETRON HCL 4 MG/2ML IJ SOLN
INTRAMUSCULAR | Status: DC | PRN
  Administered 2020-09-12: 4 mg via INTRAVENOUS

## 2020-09-12 MED ORDER — SODIUM CHLORIDE 0.9 % IV SOLN
1000.0000 mL | INTRAVENOUS | Status: DC
  Administered 2020-09-12: 03:00:00 1000 mL via INTRAVENOUS

## 2020-09-12 MED ORDER — MIDAZOLAM HCL 5 MG/5ML IJ SOLN
INTRAMUSCULAR | Status: DC | PRN
  Administered 2020-09-12: 2 mg via INTRAVENOUS

## 2020-09-12 MED ORDER — TRAMADOL HCL 50 MG PO TABS: 50.0000 mg | ORAL_TABLET | Freq: Four times a day (QID) | ORAL | Status: DC | PRN

## 2020-09-12 MED ORDER — ACETAMINOPHEN 325 MG PO TABS: 650.0000 mg | ORAL_TABLET | Freq: Four times a day (QID) | ORAL | Status: DC | PRN

## 2020-09-12 MED ORDER — LIDOCAINE VISCOUS HCL 2 % MT SOLN
15.0000 mL | Freq: Once | OROMUCOSAL | Status: DC
  Filled 2020-09-12: qty 15

## 2020-09-12 MED ORDER — LACTATED RINGERS IV SOLN: INTRAVENOUS | Status: DC | PRN

## 2020-09-12 MED ORDER — ENOXAPARIN SODIUM 40 MG/0.4ML ~~LOC~~ SOLN: 40.0000 mg | SUBCUTANEOUS | Status: DC

## 2020-09-12 MED ORDER — SODIUM CHLORIDE 0.9 % IV SOLN
2.0000 g | Freq: Two times a day (BID) | INTRAVENOUS | Status: DC
  Administered 2020-09-12 – 2020-09-13 (×3): 2 g via INTRAVENOUS
  Filled 2020-09-12 (×4): qty 2

## 2020-09-12 MED ORDER — HYDROMORPHONE HCL 1 MG/ML IJ SOLN
1.0000 mg | Freq: Once | INTRAMUSCULAR | Status: AC
  Administered 2020-09-12: 03:00:00 1 mg via INTRAVENOUS
  Filled 2020-09-12: qty 1

## 2020-09-12 MED ORDER — DIPHENHYDRAMINE HCL 25 MG PO CAPS: 25.0000 mg | ORAL_CAPSULE | Freq: Four times a day (QID) | ORAL | Status: DC | PRN

## 2020-09-12 SURGICAL SUPPLY — 41 items
ADH SKN CLS APL DERMABOND .7 (GAUZE/BANDAGES/DRESSINGS) ×1
APL SWBSTK 6 STRL LF DISP (MISCELLANEOUS) ×1
APPLICATOR COTTON TIP 6 STRL (MISCELLANEOUS) ×1 IMPLANT
APPLICATOR COTTON TIP 6IN STRL (MISCELLANEOUS) ×3
BLADE EXTENDED COATED 6.5IN (ELECTRODE) IMPLANT
BLADE HEX COATED 2.75 (ELECTRODE) ×3 IMPLANT
COVER MAYO STAND STRL (DRAPES) IMPLANT
COVER WAND RF STERILE (DRAPES) IMPLANT
DERMABOND ADVANCED (GAUZE/BANDAGES/DRESSINGS) ×2
DERMABOND ADVANCED .7 DNX12 (GAUZE/BANDAGES/DRESSINGS) IMPLANT
DRAPE LAPAROSCOPIC ABDOMINAL (DRAPES) ×3 IMPLANT
DRAPE WARM FLUID 44X44 (DRAPES) IMPLANT
ELECT REM PT RETURN 15FT ADLT (MISCELLANEOUS) ×3 IMPLANT
GAUZE SPONGE 4X4 12PLY STRL (GAUZE/BANDAGES/DRESSINGS) ×3 IMPLANT
GLOVE BIOGEL PI IND STRL 7.0 (GLOVE) ×1 IMPLANT
GLOVE BIOGEL PI INDICATOR 7.0 (GLOVE) ×2
GLOVE INDICATOR 8.0 STRL GRN (GLOVE) ×6 IMPLANT
GLOVE SS BIOGEL STRL SZ 7.5 (GLOVE) ×1 IMPLANT
GLOVE SUPERSENSE BIOGEL SZ 7.5 (GLOVE) ×2
GOWN STRL REUS W/TWL LRG LVL3 (GOWN DISPOSABLE) ×3 IMPLANT
GOWN STRL REUS W/TWL XL LVL3 (GOWN DISPOSABLE) ×6 IMPLANT
HANDLE SUCTION POOLE (INSTRUMENTS) IMPLANT
KIT BASIN OR (CUSTOM PROCEDURE TRAY) ×3 IMPLANT
KIT TURNOVER KIT A (KITS) IMPLANT
NS IRRIG 1000ML POUR BTL (IV SOLUTION) ×3 IMPLANT
PACK GENERAL/GYN (CUSTOM PROCEDURE TRAY) ×3 IMPLANT
PENCIL SMOKE EVACUATOR (MISCELLANEOUS) IMPLANT
SPONGE LAP 18X18 RF (DISPOSABLE) IMPLANT
STAPLER VISISTAT 35W (STAPLE) ×3 IMPLANT
SUCTION POOLE HANDLE (INSTRUMENTS)
SUT PDS AB 1 CTX 36 (SUTURE) IMPLANT
SUT SILK 2 0 (SUTURE)
SUT SILK 2-0 18XBRD TIE 12 (SUTURE) IMPLANT
SUT SILK 3 0 (SUTURE)
SUT SILK 3 0 SH CR/8 (SUTURE) IMPLANT
SUT SILK 3-0 18XBRD TIE 12 (SUTURE) IMPLANT
SUT VIC AB 2-0 SH 18 (SUTURE) IMPLANT
SUT VIC AB 3-0 SH 18 (SUTURE) IMPLANT
TOWEL OR 17X26 10 PK STRL BLUE (TOWEL DISPOSABLE) ×6 IMPLANT
TRAY FOLEY MTR SLVR 16FR STAT (SET/KITS/TRAYS/PACK) IMPLANT
YANKAUER SUCT BULB TIP NO VENT (SUCTIONS) IMPLANT

## 2020-09-12 NOTE — ED Provider Notes (Signed)
[facility]-EMERGENCY DEPT Provider Note  CSN: [id number] Arrival date & time: 09/12/20 0043  Chief Complaint(s) Post-op Problem and Nausea  HPI Brenda Ware is a 54 y.o. female who recently underwent a lap appendectomy presents to the emergency department for several days of generalized abdominal discomfort with nausea and nonbloody nonbilious emesis.  Pain worse with movement and palpation.  No alleviating factors.  Unable to tolerate oral intake.  No fevers or chills.  Reports having loose stools.  No dysuria.  HPI  Past Medical History Past Medical History:  Diagnosis Date  . GERD (gastroesophageal reflux disease) 06-17-13   no problems in several months  . 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 Malnutrition following gastrointestinal surgery   . Morbid obesity (HCC)   . Seasonal allergies   . SUI (stress urinary incontinence, female) 06-17-13   no longer an issue s/p bladder sling   Patient Active Problem List   Diagnosis Date Noted  . Incarcerated incisional hernia 09/12/2020  . SBO (small bowel obstruction) (HCC) 09/12/2020  . Appendicitis 09/05/2020  . Rib pain 03/13/2020  . Lymphadenopathy 02/27/2020  . Thyroid mass 02/27/2020  . Dehydration 02/25/2020  . Trigger finger, right ring finger 09/21/2017  . Obesity 11/07/2013  . 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 Obesity, Class III, BMI 40-49.9 (morbid obesity) (HCC) 06/14/2013  . Urinary incontinence, mixed 02/02/2007  . Hyperlipidemia 11/23/2006  . Tobacco use disorder 11/23/2006  . Primary hypertension 11/23/2006  . Hydradenitis 11/23/2006   Home Medication(s) Prior to Admission medications   Medication Sig Start Date End Date Taking? Authorizing Provider  CALCIUM PO Take 1 tablet by mouth daily.    [provider]  Dexlansoprazole 30 MG capsule Take 30  mg by mouth daily before breakfast.    [provider]  diclofenac Sodium (VOLTAREN) 1 % GEL Apply 4 g topically 4 (four) times daily. Patient not taking: No sig reported 03/09/20   Shirley, [place], DO  Multiple Vitamin (MULTIVITAMIN WITH MINERALS) TABS tablet Take 1 tablet by mouth at bedtime.    [provider]  oxyCODONE (OXY IR/ROXICODONE) 5 MG immediate release tablet Take 1 tablet (5 mg total) by mouth every 6 (six) hours as needed for moderate pain, severe pain or breakthrough pain. 09/06/20   Emelia Loron, MD  oxyCODONE-acetaminophen (PERCOCET) 5-325 MG tablet Take 1 tablet by mouth every 4 (four) hours as needed for severe pain. Patient not taking: No sig reported 03/09/20   Shirley, [place], DO                                                                                                                                    Past Surgical History Past Surgical History:  Procedure Laterality Date  . BLADDER SUSPENSION  04/17/2012   Procedure: [facility] PROCEDURE;  Surgeon: Martina SinnerScott A MacDiarmid, MD;  [facility];  Service: Urology;  Laterality: N/A;  . CHOLECYSTECTOMY  1991  . GASTRIC ROUX-EN-Y N/A 01/21/2020   Procedure: Conversion from Lap Gastric Sleeve to LAPAROSCOPIC ROUX-EN-Y GASTRIC BYPASS WITH UPPER ENDOSCOPY, ERAS Pathway;  Surgeon: Gaynelle AduWilson, Eric, MD;  Location: [facility];  Service: General;  Laterality: N/A;  . HIATAL HERNIA REPAIR N/A 06/24/2013   Procedure: LAPAROSCOPIC REPAIR OF HIATAL HERNIA;  Surgeon: Atilano InaEric M Wilson, MD;  Location: [facility];  Service: General;  Laterality: N/A;  . LAPAROSCOPIC APPENDECTOMY N/A 09/06/2020   Procedure: APPENDECTOMY LAPAROSCOPIC;  Surgeon: Emelia LoronWakefield, Matthew, MD;  Location: [facility];  Service: General;  Laterality: N/A;  . LAPAROSCOPIC GASTRIC SLEEVE RESECTION N/A 06/24/2013   Procedure: LAPAROSCOPIC GASTRIC SLEEVE RESECTION;  Surgeon: Atilano InaEric M Wilson, MD;  Location: [facility];  Service: General;  Laterality: N/A;  .  TUBAL LIGATION  1991  . UPPER GI ENDOSCOPY N/A 06/24/2013   Procedure: UPPER GI ENDOSCOPY;  Surgeon: Atilano InaEric M Wilson, MD;  Location: [facility];  Service: General;  Laterality: N/A;   Family History Family History  Problem Relation Age of Onset  . Colon cancer Neg Hx   . Esophageal cancer Neg Hx   . Stomach cancer Neg Hx   . Rectal cancer Neg Hx   . Breast cancer Neg Hx     Social History Social History   Tobacco Use  . Smoking status: Former Smoker    Packs/day: 1.00    Years: 10.00    Pack years: 10.00    Types: Cigarettes    Quit date: 06/27/2019    Years since quitting: 1.2  . Smokeless tobacco: Never Used  Vaping Use  . Vaping Use: Former  Substance Use Topics  . Alcohol use: No  . Drug use: No   Allergies Avelox [moxifloxacin hcl in nacl], Contrast media [iodinated diagnostic agents], Doxycycline, and Vicodin [hydrocodone-acetaminophen]  Review of Systems Review of Systems All other systems are reviewed and are negative for acute change except as noted in the HPI  Physical Exam Vital Signs  I have reviewed the triage vital signs BP (!) 127/99 (BP Location: Right Arm)   Pulse 68   Temp 98.1 F (36.7 C) (Oral)   Resp 15   Ht 5\' 5"  (1.651 m)   Wt 99.8 kg   SpO2 98%   BMI 36.61 kg/m   Physical Exam Vitals reviewed.  Constitutional:      General: She is not in acute distress.    Appearance: She is well-developed and well-nourished. She is not diaphoretic.  HENT:     Head: Normocephalic and atraumatic.     Nose: Nose normal.  Eyes:     General: No scleral icterus.       Right eye: No discharge.        Left eye: No discharge.     Extraocular Movements: EOM normal.     Conjunctiva/sclera: Conjunctivae normal.     Pupils: Pupils are equal, round, and reactive to light.  Cardiovascular:     Rate and Rhythm: Normal rate and regular rhythm.     Heart sounds: No murmur heard. No friction rub. No gallop.   Pulmonary:     Effort: Pulmonary effort is normal. No  respiratory distress.     Breath sounds: Normal breath sounds. No stridor. No rales.  Abdominal:     General: There is distension.     Palpations: Abdomen is soft.     Tenderness: There is  generalized abdominal tenderness (worse periumbically).  Musculoskeletal:        General: No tenderness or edema.     Cervical back: Normal range of motion and neck supple.  Skin:    General: Skin is warm and dry.     Findings: No erythema or rash.  Neurological:     Mental Status: She is alert and oriented to person, place, and time.  Psychiatric:        Mood and Affect: Mood and affect normal.     ED Results and Treatments Labs (all labs ordered are listed, but only abnormal results are displayed) Labs Reviewed  COMPREHENSIVE METABOLIC PANEL - Abnormal; Notable for the following components:      Result Value   Glucose, Bld 119 (*)    Calcium 8.6 (*)    AST 91 (*)    ALT 92 (*)    All other components within normal limits  CBC - Abnormal; Notable for the following components:   WBC 11.1 (*)    Platelets 409 (*)    All other components within normal limits  I-STAT BETA HCG BLOOD, ED ([facility], [facility], [facility] ONLY) - Abnormal; Notable for the following components:   I-stat hCG, quantitative 6.1 (*)    All other components within normal limits  RESP PANEL BY RT-PCR (FLU A&B, COVID) ARPGX2  LIPASE, BLOOD  URINALYSIS, ROUTINE W REFLEX MICROSCOPIC                                                                                                                         EKG  EKG Interpretation  Date/Time:    Ventricular Rate:    PR Interval:    QRS Duration:   QT Interval:    QTC Calculation:   R Axis:     Text Interpretation:        Radiology CT ABDOMEN PELVIS WO CONTRAST  Result Date: 09/12/2020 CLINICAL DATA:  Abdominal pain after recent appendectomy. EXAM: CT ABDOMEN AND PELVIS WITHOUT CONTRAST TECHNIQUE: Multidetector CT imaging of the abdomen and pelvis was performed following the standard  protocol without IV contrast. COMPARISON:  September 05, 2020 FINDINGS: Lower chest: No acute abnormality. Hepatobiliary: No focal liver abnormality is seen. Status post cholecystectomy. No biliary dilatation. Pancreas: Unremarkable. No pancreatic ductal dilatation or surrounding inflammatory changes. Spleen: Normal in size without focal abnormality. Adrenals/Urinary Tract: A stable 1.4 cm x 1.0 cm low-attenuation right adrenal mass is seen. An additional stable 1.8 cm x 1.4 cm low-attenuation left adrenal mass is noted. Kidneys are normal, without renal calculi, focal lesion, or hydronephrosis. A small amount of air is seen within the lumen of the urinary bladder. Stomach/Bowel: There is a small to moderate sized hiatal hernia. Surgical sutures are seen within the gastric region, with surgically anastomosed bowel also noted within the mid to lower left abdomen. The appendix is surgically absent. Multiple dilated small bowel loops are seen throughout the abdomen (maximum small bowel diameter of approximately 3.9 cm). These dilated  small bowel loops extend into a 7.0 cm x 3.5 cm right-sided para umbilical hernia. This represents a new finding when compared to the prior exam. Vascular/Lymphatic: Aortic atherosclerosis. No enlarged abdominal or pelvic lymph nodes. Reproductive: Uterus and bilateral adnexa are unremarkable. Other: Moderate to marked severity subcutaneous inflammatory fat stranding is seen along the anterior and lateral aspects of the abdominal wall. This represents a new finding when compared to the prior exam. 2 mm and 3 mm foci of air density are seen adjacent to the previously noted ventral hernia, at the site of trocar insertion from the patient's recent surgery. There is a mild amount of pelvic free fluid. Musculoskeletal: Moderate severity degenerative changes seen at the level of L5-S1. IMPRESSION: 1. Small bowel obstruction secondary to the presence of a new right-sided para umbilical hernia. 2.  Hiatal hernia with findings consistent with prior gastric bypass surgery. 3. Evidence of prior cholecystectomy. 4. Incidental bilateral adrenal adenomas. 5. Aortic atherosclerosis Aortic Atherosclerosis (ICD10-I70.0). Electronically Signed   By: Aram Candela M.D.   On: 09/12/2020 04:14    Pertinent labs & imaging results that were available during my care of the patient were reviewed by me and considered in my medical decision making (see chart for details).  Medications Ordered in ED Medications  sodium chloride 0.9 % bolus 1,000 mL (1,000 mLs Intravenous New Bag/Given 09/12/20 0317)    Followed by  0.9 %  sodium chloride infusion (1,000 mLs Intravenous New Bag/Given 09/12/20 0320)  diazepam (VALIUM) injection 2.5 mg (2.5 mg Intravenous Refused 09/12/20 0452)  lidocaine (XYLOCAINE) 2 % viscous mouth solution 15 mL (15 mLs Mouth/Throat Not Given 09/12/20 0451)  HYDROmorphone (DILAUDID) injection 1 mg (1 mg Intravenous Given 09/12/20 0314)  metoCLOPramide (REGLAN) injection 10 mg (10 mg Intravenous Given 09/12/20 0313)                                                                                                                                    Procedures .1-3 Lead EKG Interpretation Performed by: Nira Conn, MD Authorized by: Nira Conn, MD     Interpretation: normal     ECG rate:  80   ECG rate assessment: normal     Rhythm: sinus rhythm     Ectopy: none     Conduction: normal   .Critical Care Performed by: Nira Conn, MD Authorized by: Nira Conn, MD   Critical care provider statement:    Critical care time (minutes):  45   Critical care was time spent personally by me on the following activities:  Discussions with consultants, evaluation of patient's response to treatment, examination of patient, ordering and performing treatments and interventions, ordering and review of laboratory studies, ordering and review of radiographic  studies, pulse oximetry, re-evaluation of patient's condition, obtaining history from patient or surrogate and review of old charts    (including critical care time)  Medical Decision Making /  ED Course I have reviewed the nursing notes for this encounter and the patient's prior records (if available in EHR or on provided paperwork).   MIKHIA DUSEK was evaluated in Emergency Department on 09/12/2020 for the symptoms described in the history of present illness. She was evaluated in the context of the global COVID-19 pandemic, which necessitated consideration that the patient might be at risk for infection with the SARS-CoV-2 virus that causes COVID-19. Institutional protocols and algorithms that pertain to the evaluation of patients at risk for COVID-19 are in a state of rapid change based on information released by regulatory bodies including the CDC and federal and state organizations. These policies and algorithms were followed during the patient's care in the ED.  Work-up notable for incarcerated ventral hernia likely from a trocar site with bowel obstruction.  Possible perforation versus retained air from surgery.  Surgery consulted who will admit the patient for further management.      Final Clinical Impression(s) / ED Diagnoses Final diagnoses:  Incarcerated incisional hernia      This chart was dictated using voice recognition software.  Despite best efforts to proofread,  errors can occur which can change the documentation meaning.   Nira Conn, MD 09/12/20 [phone]

## 2020-09-12 NOTE — ED Notes (Signed)
Pt unable to provide a urine specimen at this time

## 2020-09-12 NOTE — Anesthesia Preprocedure Evaluation (Signed)
Anesthesia Evaluation  Patient identified by MRN, date of birth, ID band Patient awake    Reviewed: Allergy & Precautions, NPO status , Patient's Chart, lab work & pertinent test results  Airway Mallampati: II  TM Distance: >3 FB Neck ROM: Full    Dental  (+) Dental Advisory Given   Pulmonary former smoker,    breath sounds clear to auscultation       Cardiovascular hypertension,  Rhythm:Regular Rate:Normal     Neuro/Psych negative neurological ROS     GI/Hepatic Neg liver ROS, GERD  ,S/p gastric bypass S/p lap appy now with port site hernia.   Endo/Other  negative endocrine ROS  Renal/GU negative Renal ROS     Musculoskeletal   Abdominal   Peds  Hematology negative hematology ROS (+)   Anesthesia Other Findings   Reproductive/Obstetrics                             Anesthesia Physical Anesthesia Plan  ASA: III and emergent  Anesthesia Plan: General   Post-op Pain Management:    Induction: Intravenous and Rapid sequence  PONV Risk Score and Plan: 3 and Dexamethasone, Ondansetron and Treatment may vary due to age or medical condition  Airway Management Planned: Oral ETT  Additional Equipment:   Intra-op Plan:   Post-operative Plan: Extubation in OR  Informed Consent: I have reviewed the patients History and Physical, chart, labs and discussed the procedure including the risks, benefits and alternatives for the proposed anesthesia with the patient or authorized representative who has indicated his/her understanding and acceptance.     Dental advisory given  Plan Discussed with: CRNA  Anesthesia Plan Comments:         Anesthesia Quick Evaluation

## 2020-09-12 NOTE — ED Notes (Signed)
Nurse tech to bring pt to OR

## 2020-09-12 NOTE — ED Notes (Signed)
Patient refused the NG tube after one attempt. She said she wants to "see the surgeon immediately and only wants the NG tube administered before surgery." Dr. Eudelia Bunch aware.

## 2020-09-12 NOTE — H&P (Signed)
Brenda Ware is an 54 y.o. female.   Chief Complaint: abdominal pain nausea vomiting lap appendectomy 6 days ago HPI: Pt with 5 day history of nausea and vomiting since lap appendectomy by Dr Dwain Sarna 6 days ago. Called last night feeling poorly and told to go to ED.  CT shows port site hernia/SBO at umbilical port site.  WBC 11,000.  Pt HD stable at this point.   Past Medical History:  Diagnosis Date  . GERD (gastroesophageal reflux disease) 06-17-13   no problems in several months  . 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 Malnutrition following gastrointestinal surgery   . Morbid obesity (HCC)   . Seasonal allergies   . SUI (stress urinary incontinence, female) 06-17-13   no longer an issue s/p bladder sling    Past Surgical History:  Procedure Laterality Date  . BLADDER SUSPENSION  04/17/2012   Procedure: [facility] PROCEDURE;  Surgeon: Martina Sinner, MD;  Location: [facility];  Service: Urology;  Laterality: N/A;  . CHOLECYSTECTOMY  1991  . GASTRIC ROUX-EN-Y N/A 01/21/2020   Procedure: Conversion from Lap Gastric Sleeve to LAPAROSCOPIC ROUX-EN-Y GASTRIC BYPASS WITH UPPER ENDOSCOPY, ERAS Pathway;  Surgeon: Gaynelle Adu, MD;  Location: [facility];  Service: General;  Laterality: N/A;  . HIATAL HERNIA REPAIR N/A 06/24/2013   Procedure: LAPAROSCOPIC REPAIR OF HIATAL HERNIA;  Surgeon: Atilano Ina, MD;  Location: [facility];  Service: General;  Laterality: N/A;  . LAPAROSCOPIC APPENDECTOMY N/A 09/06/2020   Procedure: APPENDECTOMY LAPAROSCOPIC;  Surgeon: Emelia Loron, MD;  Location: [facility];  Service: General;  Laterality: N/A;  . LAPAROSCOPIC GASTRIC SLEEVE RESECTION N/A 06/24/2013   Procedure: LAPAROSCOPIC GASTRIC SLEEVE RESECTION;  Surgeon: Atilano Ina, MD;  Location: [facility];  Service: General;  Laterality: N/A;  . TUBAL LIGATION  1991  . UPPER GI ENDOSCOPY N/A 06/24/2013    Procedure: UPPER GI ENDOSCOPY;  Surgeon: Atilano Ina, MD;  Location: [facility];  Service: General;  Laterality: N/A;    Family History  Problem Relation Age of Onset  . Colon cancer Neg Hx   . Esophageal cancer Neg Hx   . Stomach cancer Neg Hx   . Rectal cancer Neg Hx   . Breast cancer Neg Hx    Social History:  reports that she quit smoking about 14 months ago. Her smoking use included cigarettes. She has a 10.00 pack-year smoking history. She has never used smokeless tobacco. She reports that she does not drink alcohol and does not use drugs.  Allergies:  Allergies  Allergen Reactions  . Avelox [Moxifloxacin Hcl In Nacl] Other (See Comments)    Causes throat and tongue to swell  . Contrast Media [Iodinated Diagnostic Agents] Hives, Itching and Rash  . Doxycycline Other (See Comments)    GI UPSET  . Vicodin [Hydrocodone-Acetaminophen] Other (See Comments)    GI UPSET    (Not in a hospital admission)   Results for orders placed or performed during the hospital encounter of 09/12/20 (from the past 48 hour(s))  Lipase, blood     Status: None   Collection Time: 09/12/20 12:58 AM  Result Value Ref Range   Lipase 27 11 - 51 U/L    Comment: Performed at [facility], [address] [place], [place] [address]  Comprehensive metabolic panel     Status: Abnormal   Collection Time: 09/12/20 12:58 AM  Result  Value Ref Range   Sodium 137 135 - 145 mmol/L   Potassium 3.6 3.5 - 5.1 mmol/L   Chloride 103 98 - 111 mmol/L   CO2 22 22 - 32 mmol/L   Glucose, Bld 119 (H) 70 - 99 mg/dL    Comment: Glucose reference range applies only to samples taken after fasting for at least 8 hours.   BUN 12 6 - 20 mg/dL   Creatinine, Ser 1.610.74 0.44 - 1.00 mg/dL   Calcium 8.6 (L) 8.9 - 10.3 mg/dL   Total Protein 6.5 6.5 - 8.1 g/dL   Albumin 3.5 3.5 - 5.0 g/dL   AST 91 (H) 15 - 41 U/L   ALT 92 (H) 0 - 44 U/L   Alkaline Phosphatase 83 38 - 126 U/L   Total Bilirubin 0.6 0.3 - 1.2 mg/dL    GFR, Estimated >09>60 >60>60 mL/min    Comment: (NOTE) Calculated using the CKD-EPI Creatinine Equation (2021)    Anion gap 12 5 - 15    Comment: Performed at [facility], [address] [place], [place] [address]  CBC     Status: Abnormal   Collection Time: 09/12/20 12:58 AM  Result Value Ref Range   WBC 11.1 (H) 4.0 - 10.5 K/uL   RBC 4.34 3.87 - 5.11 MIL/uL   Hemoglobin 13.1 12.0 - 15.0 g/dL   HCT 81.138.9 91.436.0 - 78.246.0 %   MCV 89.6 80.0 - 100.0 fL   MCH 30.2 26.0 - 34.0 pg   MCHC 33.7 30.0 - 36.0 g/dL   RDW 95.612.3 21.311.5 - 08.615.5 %   Platelets 409 (H) 150 - 400 K/uL   nRBC 0.0 0.0 - 0.2 %    Comment: Performed at [facility], [address] [place], [place] [address]  I-Stat beta hCG blood, ED     Status: Abnormal   Collection Time: 09/12/20  1:15 AM  Result Value Ref Range   I-stat hCG, quantitative 6.1 (H) <5 mIU/mL   Comment 3            Comment:   GEST. AGE      CONC.  (mIU/mL)   <=1 WEEK        5 - 50     2 WEEKS       50 - 500     3 WEEKS       100 - 10,000     4 WEEKS     1,000 - 30,000        FEMALE AND NON-PREGNANT FEMALE:     LESS THAN 5 mIU/mL    CT ABDOMEN PELVIS WO CONTRAST  Result Date: 09/12/2020 CLINICAL DATA:  Abdominal pain after recent appendectomy. EXAM: CT ABDOMEN AND PELVIS WITHOUT CONTRAST TECHNIQUE: Multidetector CT imaging of the abdomen and pelvis was performed following the standard protocol without IV contrast. COMPARISON:  September 05, 2020 FINDINGS: Lower chest: No acute abnormality. Hepatobiliary: No focal liver abnormality is seen. Status post cholecystectomy. No biliary dilatation. Pancreas: Unremarkable. No pancreatic ductal dilatation or surrounding inflammatory changes. Spleen: Normal in size without focal abnormality. Adrenals/Urinary Tract: A stable 1.4 cm x 1.0 cm low-attenuation right adrenal mass is seen. An additional stable 1.8 cm x 1.4 cm low-attenuation left adrenal mass is noted. Kidneys are normal,  without renal calculi, focal lesion, or hydronephrosis. A small amount of air is seen within the lumen of the urinary bladder. Stomach/Bowel: There is a small to moderate sized hiatal hernia. Surgical sutures are seen  within the gastric region, with surgically anastomosed bowel also noted within the mid to lower left abdomen. The appendix is surgically absent. Multiple dilated small bowel loops are seen throughout the abdomen (maximum small bowel diameter of approximately 3.9 cm). These dilated small bowel loops extend into a 7.0 cm x 3.5 cm right-sided para umbilical hernia. This represents a new finding when compared to the prior exam. Vascular/Lymphatic: Aortic atherosclerosis. No enlarged abdominal or pelvic lymph nodes. Reproductive: Uterus and bilateral adnexa are unremarkable. Other: Moderate to marked severity subcutaneous inflammatory fat stranding is seen along the anterior and lateral aspects of the abdominal wall. This represents a new finding when compared to the prior exam. 2 mm and 3 mm foci of air density are seen adjacent to the previously noted ventral hernia, at the site of trocar insertion from the patient's recent surgery. There is a mild amount of pelvic free fluid. Musculoskeletal: Moderate severity degenerative changes seen at the level of L5-S1. IMPRESSION: 1. Small bowel obstruction secondary to the presence of a new right-sided para umbilical hernia. 2. Hiatal hernia with findings consistent with prior gastric bypass surgery. 3. Evidence of prior cholecystectomy. 4. Incidental bilateral adrenal adenomas. 5. Aortic atherosclerosis Aortic Atherosclerosis (ICD10-I70.0). Electronically Signed   By: Aram Candela M.D.   On: 09/12/2020 04:14    Review of Systems  Gastrointestinal: Positive for abdominal pain, nausea and vomiting.  All other systems reviewed and are negative.   Blood pressure 101/81, pulse 85, temperature 98.1 F (36.7 C), temperature source Oral, resp. rate 15,  height 5\' 5"  (1.651 m), weight 99.8 kg, SpO2 97 %. Physical Exam HENT:     Head: Normocephalic.     Nose: Nose normal.  Eyes:     Conjunctiva/sclera: Conjunctivae normal.     Pupils: Pupils are equal, round, and reactive to light.  Cardiovascular:     Rate and Rhythm: Normal rate and regular rhythm.  Pulmonary:     Effort: Pulmonary effort is normal.     Breath sounds: Normal breath sounds.  Abdominal:     General: There is distension.     Tenderness: There is abdominal tenderness.     Hernia: A hernia is present.    Musculoskeletal:        General: Normal range of motion.     Cervical back: Neck supple.  Skin:    General: Skin is warm and dry.  Neurological:     General: No focal deficit present.     Mental Status: She is alert and oriented to person, place, and time. Mental status is at baseline.  Psychiatric:        Mood and Affect: Mood normal.        Behavior: Behavior normal.      Assessment/Plan S/p lap appendectomy 6 days ago  now with port site hernia and SBO  Recommend exploratory laparotomy and closure of hernia defect More than likely secondary to suture failure and obesity   The procedure has been discussed with the patient.  Alternative therapies have been discussed with the patient.  Operative risks include bleeding,  Infection,  Organ injury,  Nerve injury,  Blood vessel injury,  DVT,  Pulmonary embolism, bowel resection,  organ injury, wound infection, recurrence of hernia ,   Death,  And possible reoperation.  Medical management risks include worsening of present situation.  The success of the procedure is 85 - 100%  % at treating patients symptoms.  The patient understands and agrees to proceed.   Ronnett Pullin, MD 09/12/2020, 6:34 AM

## 2020-09-12 NOTE — Transfer of Care (Signed)
Immediate Anesthesia Transfer of Care Note  Patient: Brenda Ware  Procedure(s) Performed: Procedure(s): EXPLORATORY LAPAROTOMY WITH CLOSURE OF UMBILICAL FASCIAL DEHISCENCE (N/A)  Patient Location: PACU  Anesthesia Type:General  Level of Consciousness: Alert, Awake, Oriented  Airway & Oxygen Therapy: Patient Spontanous Breathing  Post-op Assessment: Report given to RN  Post vital signs: Reviewed and stable  Last Vitals:  Vitals:   09/12/20 0600 09/12/20 0700  BP: 101/81 124/72  Pulse: 85 74  Resp: 15 18  Temp:    SpO2: 97% 91%    Complications: No apparent anesthesia complications

## 2020-09-12 NOTE — Op Note (Signed)
Preoperative diagnosis: Port site hernia with small bowel obstruction  Postoperative diagnosis: Same  Procedure: Exploratory laparotomy with repair of port site hernia  Surgeon: Erroll Luna, MD  Anesthesia: General with 0.25% Marcaine with epinephrine  EBL: Minimal  Specimen: None  Drains: None  IV fluids: Per anesthesia record  Indications for procedure: The patient is a 54 year old female 6 days status post laparoscopic appendectomy by Dr. Donne Hazel.  She been developing progressive nausea and vomiting through the week and abdominal pain.  She became quite ill last night with nausea vomiting and could not keep anything down.  She went to the emergency room where CT scan showed hernia at the umbilical port site with small bowel obstruction.  She was evaluated and examined.  Procedure was discussed that she would require surgery to correct this problem.  We explained this a great length with the complications of long-term expectations as well as side effects.  She agreed to proceed with exploratory laparotomy with closure of port site hernia.The procedure has been discussed with the patient.  Alternative therapies have been discussed with the patient.  Operative risks include bleeding,  Infection,  Organ injury,  Nerve injury,  Blood vessel injury,  DVT,  Pulmonary embolism,  Bowel injury  Death,  And possible reoperation.  Medical management risks include worsening of present situation.    The patient understands and agrees to proceed.    Description of procedure: The patient was met in the holding area and questions were answered.  She was taken back to the operating.  She is placed supine upon the OR table.  After induction of general anesthesia, the abdomen was prepped and draped in sterile fashion and timeout performed.  Proper patient, site and procedure were verified.  She received 2 g of cefotetan.  The previous umbilical port site was opened.  There is pulmonary fluid.  The incision  was widened for better visualization.  There is a loop of small bowel call in this and.  The suture that had close the fascia had broke.  I made the fascial opening larger both caudal and cephalad.  Small bowel examined and found to be intact with no signs of ischemia or strangulation.  It was reduced back in abdominal cavity.  I then closed the fascial defect with 0 Vicryl using 6 suture sutures to close this defect interrupted.  The subcu tissues were then irrigated.  Of note care was taken to not entrap the bowel and the closure.  After irrigation subcutaneous tissues infiltrated tissues with local static consisting of 0.25% Marcaine with epinephrine.  The wound was then closed in layers with a deep layer of 3-0 Vicryl and 4-0 Monocryl subcuticular stitch.  Dermabond applied.  Abdominal binder placed.  All counts were found to be correct.  Patient was awoke extubated taken recovery in satisfactory condition.

## 2020-09-12 NOTE — Anesthesia Postprocedure Evaluation (Signed)
Anesthesia Post Note  Patient: Brenda Ware  Procedure(s) Performed: EXPLORATORY LAPAROTOMY WITH CLOSURE OF UMBILICAL FASCIAL DEHISCENCE (N/A Abdomen)     Patient location during evaluation: PACU Anesthesia Type: General Level of consciousness: awake and alert Pain management: pain level controlled Vital Signs Assessment: post-procedure vital signs reviewed and stable Respiratory status: spontaneous breathing, nonlabored ventilation, respiratory function stable and patient connected to nasal cannula oxygen Cardiovascular status: blood pressure returned to baseline and stable Postop Assessment: no apparent nausea or vomiting Anesthetic complications: no   No complications documented.  Last Vitals:  Vitals:   09/12/20 1325 09/12/20 1425  BP: (!) 133/58 (!) 108/49  Pulse: 68 72  Resp: 17 16  Temp: 36.6 C 36.8 C  SpO2: 94% 95%    Last Pain:  Vitals:   09/12/20 1425  TempSrc: Oral  PainSc:                  Kennieth Rad

## 2020-09-12 NOTE — ED Notes (Addendum)
OR called. Ready for pt in 1842

## 2020-09-12 NOTE — Anesthesia Procedure Notes (Signed)
Procedure Name: Intubation Date/Time: 09/12/2020 10:11 AM Performed by: Gerald Leitz, CRNA Pre-anesthesia Checklist: Patient identified, Patient being monitored, Timeout performed, Emergency Drugs available and Suction available Patient Re-evaluated:Patient Re-evaluated prior to induction Oxygen Delivery Method: Circle system utilized Preoxygenation: Pre-oxygenation with 100% oxygen Induction Type: IV induction Ventilation: Mask ventilation without difficulty Laryngoscope Size: Mac and 3 Grade View: Grade I Tube type: Oral Tube size: 7.0 mm Number of attempts: 1 Placement Confirmation: ETT inserted through vocal cords under direct vision,  positive ETCO2 and breath sounds checked- equal and bilateral Secured at: 22 cm Tube secured with: Tape Dental Injury: Teeth and Oropharynx as per pre-operative assessment

## 2020-09-12 NOTE — ED Triage Notes (Signed)
Patient here from home reporting post op problem. Reports ongoing pain after having appendix removed on 12/11. States that she was seen at [facility] yesterday and got a "banana bag" and fluids.

## 2020-09-13 ENCOUNTER — Encounter (HOSPITAL_COMMUNITY): Payer: Self-pay | Admitting: Surgery

## 2020-09-13 LAB — COMPREHENSIVE METABOLIC PANEL
ALT: 132 U/L — ABNORMAL HIGH (ref 0–44)
AST: 87 U/L — ABNORMAL HIGH (ref 15–41)
Albumin: 2.7 g/dL — ABNORMAL LOW (ref 3.5–5.0)
Alkaline Phosphatase: 99 U/L (ref 38–126)
Anion gap: 9 (ref 5–15)
BUN: 7 mg/dL (ref 6–20)
CO2: 24 mmol/L (ref 22–32)
Calcium: 7.9 mg/dL — ABNORMAL LOW (ref 8.9–10.3)
Chloride: 107 mmol/L (ref 98–111)
Creatinine, Ser: 0.66 mg/dL (ref 0.44–1.00)
GFR, Estimated: >60 mL/min (ref >60)
Glucose, Bld: 116 mg/dL — ABNORMAL HIGH (ref 70–99)
Potassium: 3.4 mmol/L — ABNORMAL LOW (ref 3.5–5.1)
Sodium: 140 mmol/L (ref 135–145)
Total Bilirubin: 0.3 mg/dL (ref 0.3–1.2)
Total Protein: 5.3 g/dL — ABNORMAL LOW (ref 6.5–8.1)

## 2020-09-13 LAB — CBC
HCT: 34.4 % — ABNORMAL LOW (ref 36.0–46.0)
Hemoglobin: 11.1 g/dL — ABNORMAL LOW (ref 12.0–15.0)
MCH: 30.3 pg (ref 26.0–34.0)
MCHC: 32.3 g/dL (ref 30.0–36.0)
MCV: 94 fL (ref 80.0–100.0)
Platelets: 353 10*3/uL (ref 150–400)
RBC: 3.66 MIL/uL — ABNORMAL LOW (ref 3.87–5.11)
RDW: 12.9 % (ref 11.5–15.5)
WBC: 8.2 10*3/uL (ref 4.0–10.5)
nRBC: 0 % (ref 0.0–0.2)

## 2020-09-13 LAB — URINALYSIS, ROUTINE W REFLEX MICROSCOPIC
Bilirubin Urine: NEGATIVE
Glucose, UA: NEGATIVE mg/dL
Hgb urine dipstick: NEGATIVE
Ketones, ur: 5 mg/dL — AB
Leukocytes,Ua: NEGATIVE
Nitrite: NEGATIVE
Protein, ur: NEGATIVE mg/dL
Specific Gravity, Urine: 1.027 (ref 1.005–1.030)
pH: 5 (ref 5.0–8.0)

## 2020-09-13 MED ORDER — MAGIC MOUTHWASH
15.0000 mL | Freq: Four times a day (QID) | ORAL | Status: DC | PRN
  Filled 2020-09-13: qty 15

## 2020-09-13 MED ORDER — ENALAPRILAT 1.25 MG/ML IV SOLN
0.6250 mg | Freq: Four times a day (QID) | INTRAVENOUS | Status: DC | PRN
  Filled 2020-09-13: qty 1

## 2020-09-13 MED ORDER — METHOCARBAMOL 500 MG PO TABS: 1000.0000 mg | ORAL_TABLET | Freq: Four times a day (QID) | ORAL | Status: DC | PRN

## 2020-09-13 MED ORDER — DIPHENHYDRAMINE HCL 50 MG/ML IJ SOLN
12.5000 mg | Freq: Four times a day (QID) | INTRAMUSCULAR | Status: DC | PRN
Start: 2020-09-13 — End: 2020-09-14

## 2020-09-13 MED ORDER — METHOCARBAMOL 1000 MG/10ML IJ SOLN
1000.0000 mg | Freq: Four times a day (QID) | INTRAVENOUS | Status: DC | PRN
  Filled 2020-09-13: qty 10

## 2020-09-13 MED ORDER — PROCHLORPERAZINE EDISYLATE 10 MG/2ML IJ SOLN: 5.0000 mg | INTRAMUSCULAR | Status: DC | PRN

## 2020-09-13 MED ORDER — LIP MEDEX EX OINT
1.0000 "application " | TOPICAL_OINTMENT | Freq: Two times a day (BID) | CUTANEOUS | Status: DC
  Administered 2020-09-13 – 2020-09-14 (×3): 1 via TOPICAL
  Filled 2020-09-13: qty 7

## 2020-09-13 MED ORDER — MENTHOL 3 MG MT LOZG: 1.0000 | LOZENGE | OROMUCOSAL | Status: DC | PRN

## 2020-09-13 MED ORDER — ALUM & MAG HYDROXIDE-SIMETH 200-200-20 MG/5ML PO SUSP: 30.0000 mL | Freq: Four times a day (QID) | ORAL | Status: DC | PRN

## 2020-09-13 MED ORDER — ACETAMINOPHEN 500 MG PO TABS
1000.0000 mg | ORAL_TABLET | Freq: Three times a day (TID) | ORAL | Status: DC
  Administered 2020-09-13 – 2020-09-14 (×4): 1000 mg via ORAL
  Filled 2020-09-13 (×4): qty 2

## 2020-09-13 MED ORDER — BISACODYL 10 MG RE SUPP: 10.0000 mg | Freq: Two times a day (BID) | RECTAL | Status: DC | PRN

## 2020-09-13 MED ORDER — LACTATED RINGERS IV BOLUS: 1000.0000 mL | Freq: Three times a day (TID) | INTRAVENOUS | Status: DC | PRN

## 2020-09-13 MED ORDER — DICLOFENAC SODIUM 1 % EX GEL
4.0000 g | Freq: Four times a day (QID) | CUTANEOUS | Status: DC | PRN
  Filled 2020-09-13: qty 100

## 2020-09-13 MED ORDER — SODIUM CHLORIDE 0.9% FLUSH
3.0000 mL | Freq: Two times a day (BID) | INTRAVENOUS | Status: DC
  Administered 2020-09-13 – 2020-09-14 (×2): 3 mL via INTRAVENOUS

## 2020-09-13 MED ORDER — PHENOL 1.4 % MT LIQD
2.0000 | OROMUCOSAL | Status: DC | PRN
  Filled 2020-09-13: qty 177

## 2020-09-13 MED ORDER — SIMETHICONE 40 MG/0.6ML PO SUSP
40.0000 mg | Freq: Four times a day (QID) | ORAL | Status: DC | PRN
  Filled 2020-09-13: qty 0.6

## 2020-09-13 MED ORDER — METOPROLOL TARTRATE 5 MG/5ML IV SOLN: 5.0000 mg | Freq: Four times a day (QID) | INTRAVENOUS | Status: DC | PRN

## 2020-09-13 MED ORDER — SODIUM CHLORIDE 0.9 % IV SOLN: 250.0000 mL | INTRAVENOUS | Status: DC | PRN

## 2020-09-13 MED ORDER — SODIUM CHLORIDE 0.9% FLUSH: 3.0000 mL | INTRAVENOUS | Status: DC | PRN

## 2020-09-13 NOTE — Progress Notes (Signed)
1 Day Post-Op   Subjective/Chief Complaint: Patient sore but no significant nausea or vomiting.  Not much of an appetite.  The binder is helping her pain.   Objective: Vital signs in last 24 hours: Temp:  [97.5 F (36.4 C)-98.6 F (37 C)] 98.2 F (36.8 C) (12/19 0944) Pulse Rate:  [64-87] 64 (12/19 0944) Resp:  [13-22] 15 (12/19 0944) BP: (98-137)/(49-76) 98/62 (12/19 0944) SpO2:  [94 %-100 %] 100 % (12/19 0944) Last BM Date: 09/12/20  Intake/Output from previous day: 12/18 0701 - 12/19 0700 In: 3243.3 [P.O.:750; I.V.:2293.3; IV Piggyback:200] Out: 5 [Blood:5] Intake/Output this shift: Total I/O In: 120 [P.O.:120] Out: -   Resp: clear to auscultation bilaterally Cardio: regular rate and rhythm, S1, S2 normal, no murmur, click, rub or gallop Incision/Wound: Incision clean dry intact.  Abdomen soft nondistended minimal soreness around the incision  Lab Results:  Recent Labs    09/12/20 0058 09/13/20 0530  WBC 11.1* 8.2  HGB 13.1 11.1*  HCT 38.9 34.4*  PLT 409* 353   BMET Recent Labs    09/12/20 0058 09/13/20 0530  NA 137 140  K 3.6 3.4*  CL 103 107  CO2 22 24  GLUCOSE 119* 116*  BUN 12 7  CREATININE 0.74 0.66  CALCIUM 8.6* 7.9*   PT/INR No results for input(s): LABPROT, INR in the last 72 hours. ABG No results for input(s): PHART, HCO3 in the last 72 hours.  Invalid input(s): PCO2, PO2  Studies/Results: CT ABDOMEN PELVIS WO CONTRAST  Result Date: 09/12/2020 CLINICAL DATA:  Abdominal pain after recent appendectomy. EXAM: CT ABDOMEN AND PELVIS WITHOUT CONTRAST TECHNIQUE: Multidetector CT imaging of the abdomen and pelvis was performed following the standard protocol without IV contrast. COMPARISON:  September 05, 2020 FINDINGS: Lower chest: No acute abnormality. Hepatobiliary: No focal liver abnormality is seen. Status post cholecystectomy. No biliary dilatation. Pancreas: Unremarkable. No pancreatic ductal dilatation or surrounding inflammatory changes.  Spleen: Normal in size without focal abnormality. Adrenals/Urinary Tract: A stable 1.4 cm x 1.0 cm low-attenuation right adrenal mass is seen. An additional stable 1.8 cm x 1.4 cm low-attenuation left adrenal mass is noted. Kidneys are normal, without renal calculi, focal lesion, or hydronephrosis. A small amount of air is seen within the lumen of the urinary bladder. Stomach/Bowel: There is a small to moderate sized hiatal hernia. Surgical sutures are seen within the gastric region, with surgically anastomosed bowel also noted within the mid to lower left abdomen. The appendix is surgically absent. Multiple dilated small bowel loops are seen throughout the abdomen (maximum small bowel diameter of approximately 3.9 cm). These dilated small bowel loops extend into a 7.0 cm x 3.5 cm right-sided para umbilical hernia. This represents a new finding when compared to the prior exam. Vascular/Lymphatic: Aortic atherosclerosis. No enlarged abdominal or pelvic lymph nodes. Reproductive: Uterus and bilateral adnexa are unremarkable. Other: Moderate to marked severity subcutaneous inflammatory fat stranding is seen along the anterior and lateral aspects of the abdominal wall. This represents a new finding when compared to the prior exam. 2 mm and 3 mm foci of air density are seen adjacent to the previously noted ventral hernia, at the site of trocar insertion from the patient's recent surgery. There is a mild amount of pelvic free fluid. Musculoskeletal: Moderate severity degenerative changes seen at the level of L5-S1. IMPRESSION: 1. Small bowel obstruction secondary to the presence of a new right-sided para umbilical hernia. 2. Hiatal hernia with findings consistent with prior gastric bypass surgery. 3. Evidence of  prior cholecystectomy. 4. Incidental bilateral adrenal adenomas. 5. Aortic atherosclerosis Aortic Atherosclerosis (ICD10-I70.0). Electronically Signed   By: Aram Candela M.D.   On: 09/12/2020 04:14     Anti-infectives: Anti-infectives (From admission, onward)   Start     Dose/Rate Route Frequency Ordered Stop   09/12/20 2230  cefoTEtan (CEFOTAN) 2 g in sodium chloride 0.9 % 100 mL IVPB        2 g 200 mL/hr over 30 Minutes Intravenous Every 12 hours 09/12/20 1242     09/12/20 1045  cefoTEtan (CEFOTAN) 2 g in sodium chloride 0.9 % 100 mL IVPB        2 g 200 mL/hr over 30 Minutes Intravenous On call to O.R. 09/12/20 1030 09/12/20 1104      Assessment/Plan: s/p Procedure(s): EXPLORATORY LAPAROTOMY WITH CLOSURE OF UMBILICAL FASCIAL DEHISCENCE (N/A) Status post laparoscopic appendectomy 8 days ago  Advance diet  Decrease IV fluids  Hopefully discharge Monday    LOS: 1 day    Dortha Schwalbe MD 09/13/2020

## 2020-09-14 MED ORDER — ONDANSETRON HCL 4 MG PO TABS: 4.0000 mg | ORAL_TABLET | Freq: Every day | ORAL | 0 refills | Status: AC | PRN

## 2020-09-14 MED ORDER — OXYCODONE HCL 5 MG PO TABS: 5.0000 mg | ORAL_TABLET | ORAL | 0 refills | Status: AC | PRN

## 2020-09-14 MED ORDER — HYDROMORPHONE HCL 1 MG/ML IJ SOLN: 0.5000 mg | INTRAMUSCULAR | Status: DC | PRN

## 2020-09-14 MED ORDER — ACETAMINOPHEN 500 MG PO TABS: ORAL_TABLET | ORAL | 0 refills | Status: AC

## 2020-09-14 NOTE — Discharge Instructions (Signed)
Laparoscopic Ventral Hernia Repair, Care After This sheet gives you information about how to care for yourself after your procedure. Your health care provider may also give you more specific instructions. If you have problems or questions, contact your health care provider. What can I expect after the procedure? After the procedure, it is common to have:  Pain, discomfort, or soreness. Follow these instructions at home: Incision care   Follow instructions from your health care provider about how to take care of your incision. Make sure you: ? Wash your hands with soap and water before you change your bandage (dressing) or before you touch your abdomen. If soap and water are not available, use hand sanitizer. ? Change your dressing as told by your health care provider. ? Leave stitches (sutures), skin glue, or adhesive strips in place. These skin closures may need to stay in place for 2 weeks or longer. If adhesive strip edges start to loosen and curl up, you may trim the loose edges. Do not remove adhesive strips completely unless your health care provider tells you to do that.  Check your incision area every day for signs of infection. Check for: ? Redness, swelling, or pain. ? Fluid or blood. ? Warmth. ? Pus or a bad smell. Bathing   Do not take baths, swim, or use a hot tub until your health care provider approves. Ask your health care provider if you can take showers. You may only be allowed to take sponge baths for bathing.  Keep your bandage (dressing) dry until your health care provider says it can be removed. Activity  Do not lift anything that is heavier than 10 lb (4.5 kg) until your health care provider approves.  Do not drive or use heavy machinery while taking prescription pain medicine. Ask your health care provider when it is safe for you to drive or use heavy machinery.  Do not drive for 24 hours if you were given a medicine to help you relax (sedative) during your  procedure.  Rest as told by your health care provider. You may return to your normal activities when your health care provider approves. General instructions  Take over-the-counter and prescription medicines only as told by your health care provider.  To prevent or treat constipation while you are taking prescription pain medicine, your health care provider may recommend that you: ? Take over-the-counter or prescription medicines. ? Eat foods that are high in fiber, such as fresh fruits and vegetables, whole grains, and beans. ? Limit foods that are high in fat and processed sugars, such as fried and sweet foods.  Drink enough fluid to keep your urine clear or pale yellow.  Hold a pillow over your abdomen when you cough or sneeze. This helps with pain.  Keep all follow-up visits as told by your health care provider. This is important. Contact a health care provider if:  You have: ? A fever or chills. ? Redness, swelling, or pain around your incision. ? Fluid or blood coming from your incision. ? Pus or a bad smell coming from your incision. ? Pain that gets worse or does not get better with medicine. ? Nausea or vomiting. ? A cough. ? Shortness of breath.  Your incision feels warm to the touch.  You have not had a bowel movement in three days.  You are not able to urinate. Get help right away if:  You have severe pain in your abdomen.  You have persistent nausea and vomiting.  You have  redness, warmth, or pain in your leg.  You have chest pain.  You have trouble breathing. Summary  After this procedure, it is common to have pain, discomfort, or soreness.  Follow instructions from your health care provider about how to take care of your incision.  Check your incision area every day for signs of infection. Report any signs of infection to your health care provider.  Keep all follow-up visits as told by your health care provider. This is important. This information  is not intended to replace advice given to you by your health care provider. Make sure you discuss any questions you have with your health care provider. Document Revised: 08/25/2017 Document Reviewed: 05/04/2016 Elsevier Patient Education  2020 [employer].

## 2020-09-14 NOTE — Progress Notes (Signed)
2 Days Post-Op   Subjective/Chief Complaint: Patient sore but no significant nausea or vomiting.  Eating better.  The binder is helping her pain.   Objective: Vital signs in last 24 hours: Temp:  [97.9 F (36.6 C)-98.2 F (36.8 C)] 98 F (36.7 C) (12/20 3785) Pulse Rate:  [62-75] 62 (12/20 0633) Resp:  [14-18] 16 (12/20 0633) BP: (98-134)/(62-78) 125/78 (12/20 0633) SpO2:  [96 %-100 %] 97 % (12/20 0633) Last BM Date: 09/12/20  Intake/Output from previous day: 12/19 0701 - 12/20 0700 In: 2239.5 [P.O.:840; I.V.:1199.5; IV Piggyback:200] Out: 1600 [Urine:1600] Intake/Output this shift: No intake/output data recorded.  Resp: clear to auscultation bilaterally Cardio: regular rate and rhythm, S1, S2 normal, no murmur, click, rub or gallop Incision/Wound: Incision clean dry intact.  Abdomen soft nondistended minimal soreness around the incision  Lab Results:  Recent Labs    09/12/20 0058 09/13/20 0530  WBC 11.1* 8.2  HGB 13.1 11.1*  HCT 38.9 34.4*  PLT 409* 353   BMET Recent Labs    09/12/20 0058 09/13/20 0530  NA 137 140  K 3.6 3.4*  CL 103 107  CO2 22 24  GLUCOSE 119* 116*  BUN 12 7  CREATININE 0.74 0.66  CALCIUM 8.6* 7.9*   PT/INR No results for input(s): LABPROT, INR in the last 72 hours. ABG No results for input(s): PHART, HCO3 in the last 72 hours.  Invalid input(s): PCO2, PO2  Studies/Results: No results found.  Anti-infectives: Anti-infectives (From admission, onward)   Start     Dose/Rate Route Frequency Ordered Stop   09/12/20 2230  cefoTEtan (CEFOTAN) 2 g in sodium chloride 0.9 % 100 mL IVPB        2 g 200 mL/hr over 30 Minutes Intravenous Every 12 hours 09/12/20 1242     09/12/20 1045  cefoTEtan (CEFOTAN) 2 g in sodium chloride 0.9 % 100 mL IVPB        2 g 200 mL/hr over 30 Minutes Intravenous On call to O.R. 09/12/20 1030 09/12/20 1104      Assessment/Plan: s/p Procedure(s): EXPLORATORY LAPAROTOMY WITH CLOSURE OF UMBILICAL FASCIAL  DEHISCENCE (N/A) Status post laparoscopic appendectomy 8 days ago  Cont diet  SL IV fluids  Hopefully discharge later today    LOS: 2 days    Vanita Panda MD 09/14/2020

## 2020-09-14 NOTE — Progress Notes (Signed)
MD paged for prn nausea med for d/c.

## 2020-09-15 NOTE — Discharge Summary (Signed)
Patient ID: Brenda Ware [id number] 54 y.o. [birth year]  09/12/2020  Discharge date and time: 09/14/2020 11:36 AM  Admitting Physician: CCS, MD  Discharge Physician: CCS, MD  Admission Diagnoses: Incarcerated ventral hernia [K43.6] Incarcerated incisional hernia [K43.0] Port-site hernia [K91.89, K43.2] Surgery, elective [Z41.9]  Discharge Diagnoses:   Operations: Procedure(s): EXPLORATORY LAPAROTOMY WITH CLOSURE OF UMBILICAL FASCIAL DEHISCENCE    Discharged Condition: good    Hospital Course: Pt admitted after surgery and repair of incisional hernia.  Her diet was advanced and she was discharged home in stable condition once tolerating a diet and pain controlled with PO meds  Consults: None  Significant Diagnostic Studies: labs: cbc, bmet  Treatments: IV hydration, analgesia: acetaminophen and oxycodone and surgery: see above  Disposition: Home

## 2020-09-23 ENCOUNTER — Encounter: Payer: Self-pay | Admitting: Family Medicine

## 2020-09-23 ENCOUNTER — Other Ambulatory Visit: Payer: Self-pay

## 2020-09-23 ENCOUNTER — Ambulatory Visit (INDEPENDENT_AMBULATORY_CARE_PROVIDER_SITE_OTHER): Payer: BC Managed Care – PPO | Admitting: Family Medicine

## 2020-09-23 VITALS — BP 120/72 | HR 67 | Wt 232.4 lb

## 2020-09-23 DIAGNOSIS — R7989 Other specified abnormal findings of blood chemistry: Secondary | ICD-10-CM | POA: Insufficient documentation

## 2020-09-23 NOTE — Assessment & Plan Note (Addendum)
View patient document on phone form [facility] TSH 08/12/20 was 4.63 concerned about metabolic derangement as the cause of not being able to lose weight s/p surgery. She denies symptoms of hypothyroidism. Neck exam is unremarkable at this time. Will repeat TSH and obtain T4. Consider need for medication managment pending results. Patient agreeable with this plan.

## 2020-09-23 NOTE — Progress Notes (Signed)
    SUBJECTIVE:   CHIEF COMPLAINT / HPI:   Ms. Piggee is a 54 yo F who presents for the issue below.   Elevated TSH Was recently seen by her bariatric doctor and told he TSH was elevated and was asked to follow up with PCP for further evaluation. She is 6 month post surgery and not losing weight and the doctor thought that her thyroid may be the issue. She has a history of a thyroid mass that was aspirated 6 months prior. Denies symptoms of continued weight gain; she reports her weight is stagnant. She denies loss of hair, palpitation, or neck swelling.   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 Age appropriate. HEENT: Neck is supple. Normal contour of thyroid, no masses palpated, no lymphadenopathy Cardiac: RRR, normal heart sounds, no murmurs Respiratory: CTAB, normal effort Abdomen: soft, nontender, nondistended Extremities: No edema or cyanosis. Skin: Warm and dry, no rashes noted Neuro: alert and oriented, no focal deficits Psych: normal affect  ASSESSMENT/PLAN:   Elevated TSH View patient document on phone form [facility] TSH 08/12/20 was 4.63 concerned about metabolic derangement as the cause of not being able to lose weight s/p surgery. She denies symptoms of hypothyroidism. Neck exam is unremarkable at this time. Will repeat TSH and obtain T4. Consider need for medication managment pending results. Patient agreeable with this plan.   Lavonda Jumbo, DO [facility]

## 2020-09-23 NOTE — Patient Instructions (Addendum)
It was wonderful to see you today.   Today we talked about:  Your elevated TSH. We will recheck these levels and I will call to discuss our next steps. If normal I will communicate via Mychart.   Please call the clinic at [phone] if you have any concerns. It was our pleasure to serve you.  Dr. Salvadore Dom

## 2020-09-24 LAB — TSH+FREE T4
Free T4: 1.15 ng/dL (ref 0.82–1.77)
TSH: 1.98 u[IU]/mL (ref 0.450–4.500)

## 2020-10-01 ENCOUNTER — Encounter: Payer: Self-pay | Attending: General Surgery | Admitting: Skilled Nursing Facility1

## 2020-10-01 ENCOUNTER — Other Ambulatory Visit: Payer: Self-pay

## 2020-10-01 DIAGNOSIS — E6609 Other obesity due to excess calories: Secondary | ICD-10-CM | POA: Insufficient documentation

## 2020-10-01 NOTE — Progress Notes (Signed)
Follow-up visit:  Post-Operative sleeve to RYGB Surgery  Pt states she had her appendix out and then had to be readmitted after the incision opened. Pt states since these surgeries she cannot tolerate much and is having trouble tolerating the multivitamin and calcium. Pt states she is experiencing  nausea but no emesis. Pt states she has been taking her nausea medicine. Pt states on days she does not feel the greatest she eats one meal in the afternoon and drinks water + crystal light throughout the dat. Pt states she will not eat any seafood.   Body Composition Scale 10/02/2019  Current Body Weight 230.3  Total Body Fat % 43.6  Visceral Fat 15  Fat-Free Mass % 56.3   Total Body Water % 42.6  Muscle-Mass lbs 31.1  BMI 38  Body Fat Displacement          Torso  lbs 62.3         Left Leg  lbs 12.4         Right Leg  lbs 12.4         Left Arm  lbs 6.2         Right Arm   lbs 6.2    24 hr recall: fist meal: skipped (no hunger) Snack:second meal:  Second meal: zoes kitchen: white bean chicken chili + pita Third meal: lettuce wrap + bacon or cheese and grapes Snack: peanut butter crackers   Beverages: water + flavoring, plain water, unsweet tea  Fluid intake: adequate   Medications: See List Supplementation: appropriate (not consistent)  Using straws: no Drinking while eating: no Having you been chewing well: yes Chewing/swallowing difficulties: no Changes in vision: no Changes to mood/headaches: no Hair loss/Cahnges to skin/Changes to nails: no Any difficulty focusing or concentrating: no Sweating: no Dizziness/Lightheaded: no Palpitations: no  Carbonated beverages: no N/V/D/C/GAS: no Abdominal Pain: no Dumping syndrome: no  Recent physical activity:  ADL's  Progress Towards Goal(s):  In Progress  Education topics: Nutrition needed to heal from multiple surgeries Incomplete verses complete protein   Goals: On days you do not feel well: aim for soup and hot  tea Try the savory protein soups   Teaching Method Utilized:  Visual Auditory Hands on  Demonstrated degree of understanding via:  Teach Back   Monitoring/Evaluation:  Dietary intake, exercise, and body weight.

## 2020-11-26 ENCOUNTER — Encounter: Payer: BC Managed Care – PPO | Attending: General Surgery | Admitting: Skilled Nursing Facility1

## 2020-11-26 ENCOUNTER — Other Ambulatory Visit: Payer: Self-pay

## 2020-11-26 DIAGNOSIS — E6609 Other obesity due to excess calories: Secondary | ICD-10-CM | POA: Insufficient documentation

## 2020-11-26 NOTE — Progress Notes (Signed)
Follow-up visit:  Post-Operative sleeve to RYGB Surgery  Pt states she will not eat any seafood.   Pt states she is getting better and not feeling as sick as before. Pt states she is building a house so has been living in a camper for 2 years so states when she moves in she will be more active.   Pt is content with where she is at and what she eats.   Weighs 234 pounds today.    Body Composition Scale 10/02/2019  Current Body Weight 230.3  Total Body Fat % 43.6  Visceral Fat 15  Fat-Free Mass % 56.3   Total Body Water % 42.6  Muscle-Mass lbs 31.1  BMI 38  Body Fat Displacement          Torso  lbs 62.3         Left Leg  lbs 12.4         Right Leg  lbs 12.4         Left Arm  lbs 6.2         Right Arm   lbs 6.2    24 hr recall: fist meal (6am) protein shake  Snack: cheese toast or fast food sandwich Second meal: lunchable: crackers, cheese, [place] Snack: popcorn and carrot sticks dipped in ranch Third meal: typically eating out: inclusive of mac n cheese Snack: peanut butter crackers or carrot sticks + ranch   Beverages: water + flavoring, plain water, unsweet tea  Fluid intake: 67 ounces   Medications: See List Supplementation: appropriate (not consistent)  Using straws: no Drinking while eating: no Having you been chewing well: yes Chewing/swallowing difficulties: no Changes in vision: no Changes to mood/headaches: no Hair loss/Changes to skin/Changes to nails: no Any difficulty focusing or concentrating: no Sweating: no Dizziness/Lightheaded: no Palpitations: no  Carbonated beverages: no N/V/D/C/GAS: no Abdominal Pain: no Dumping syndrome: no  Recent physical activity:  ADL's  Progress Towards Goal(s):  In Progress  Previous Education topics: Nutrition needed to heal from multiple surgeries Incomplete verses complete protein    Teaching Method Utilized:  Visual Auditory Hands on  Demonstrated degree of understanding via:  Teach Back    Monitoring/Evaluation:  Dietary intake, exercise, and body weight.

## 2021-06-03 ENCOUNTER — Other Ambulatory Visit: Payer: Self-pay | Admitting: Family Medicine

## 2021-06-28 ENCOUNTER — Other Ambulatory Visit: Payer: Self-pay

## 2021-06-28 ENCOUNTER — Ambulatory Visit (INDEPENDENT_AMBULATORY_CARE_PROVIDER_SITE_OTHER): Payer: BC Managed Care – PPO | Admitting: Family Medicine

## 2021-06-28 ENCOUNTER — Other Ambulatory Visit (HOSPITAL_COMMUNITY)
Admission: RE | Admit: 2021-06-28 | Discharge: 2021-06-28 | Disposition: A | Discharge status: Home / Self Care | Payer: No Typology Code available for payment source | Source: Ambulatory Visit | Attending: Family Medicine | Admitting: Family Medicine

## 2021-06-28 DIAGNOSIS — E876 Hypokalemia: Secondary | ICD-10-CM

## 2021-06-28 DIAGNOSIS — Z01419 Encounter for gynecological examination (general) (routine) without abnormal findings: Secondary | ICD-10-CM | POA: Diagnosis present

## 2021-06-28 DIAGNOSIS — Z1159 Encounter for screening for other viral diseases: Secondary | ICD-10-CM

## 2021-06-28 DIAGNOSIS — R7989 Other specified abnormal findings of blood chemistry: Secondary | ICD-10-CM

## 2021-06-28 DIAGNOSIS — Z1211 Encounter for screening for malignant neoplasm of colon: Secondary | ICD-10-CM

## 2021-06-28 NOTE — Patient Instructions (Signed)
Today we did your Pap.  We also discussed referring you for a colonoscopy he should get a call to schedule this.  If you do not hear something within a week please let us know.  I will also follow-up on your potassium and liver function test.  In the future please consider receiving the flu shot, shingles vaccine, COVID-vaccine, and tetanus vaccine.  Follow-up as needed.
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
## 2021-06-29 LAB — COMPREHENSIVE METABOLIC PANEL
ALT: 42 IU/L — ABNORMAL HIGH (ref 0–32)
AST: 27 IU/L (ref 0–40)
Albumin/Globulin Ratio: 1.5 (ref 1.2–2.2)
Albumin: 4 g/dL (ref 3.8–4.9)
Alkaline Phosphatase: 99 IU/L (ref 44–121)
BUN/Creatinine Ratio: 15 (ref 9–23)
BUN: 13 mg/dL (ref 6–24)
Bilirubin Total: 0.3 mg/dL (ref 0.0–1.2)
CO2: 25 mmol/L (ref 20–29)
Calcium: 9.4 mg/dL (ref 8.7–10.2)
Chloride: 104 mmol/L (ref 96–106)
Creatinine, Ser: 0.86 mg/dL (ref 0.57–1.00)
Globulin, Total: 2.6 g/dL (ref 1.5–4.5)
Glucose: 87 mg/dL (ref 70–99)
Potassium: 4.4 mmol/L (ref 3.5–5.2)
Sodium: 143 mmol/L (ref 134–144)
Total Protein: 6.6 g/dL (ref 6.0–8.5)
eGFR: 80 mL/min/{1.73_m2} (ref >59)

## 2021-06-29 LAB — CYTOLOGY - PAP
Comment: NEGATIVE
Diagnosis: NEGATIVE
High risk HPV: NEGATIVE

## 2021-06-29 LAB — HCV AB W REFLEX TO QUANT PCR: HCV Ab: <0.1 s/co ratio (ref 0.0–0.9)

## 2021-06-29 LAB — HCV INTERPRETATION

## 2021-09-01 ENCOUNTER — Encounter (HOSPITAL_BASED_OUTPATIENT_CLINIC_OR_DEPARTMENT_OTHER): Payer: Self-pay | Admitting: *Deleted

## 2021-09-01 ENCOUNTER — Other Ambulatory Visit: Payer: Self-pay

## 2021-09-01 ENCOUNTER — Emergency Department (HOSPITAL_BASED_OUTPATIENT_CLINIC_OR_DEPARTMENT_OTHER): Payer: No Typology Code available for payment source

## 2021-09-01 ENCOUNTER — Emergency Department (HOSPITAL_BASED_OUTPATIENT_CLINIC_OR_DEPARTMENT_OTHER)
Admission: EM | Admit: 2021-09-01 | Discharge: 2021-09-01 | Disposition: A | Discharge status: Home / Self Care | Payer: No Typology Code available for payment source | Attending: Emergency Medicine | Admitting: Emergency Medicine

## 2021-09-01 DIAGNOSIS — M5442 Lumbago with sciatica, left side: Secondary | ICD-10-CM | POA: Insufficient documentation

## 2021-09-01 DIAGNOSIS — Z87891 Personal history of nicotine dependence: Secondary | ICD-10-CM | POA: Diagnosis not present

## 2021-09-01 DIAGNOSIS — Y92410 Unspecified street and highway as the place of occurrence of the external cause: Secondary | ICD-10-CM | POA: Insufficient documentation

## 2021-09-01 DIAGNOSIS — M545 Low back pain, unspecified: Secondary | ICD-10-CM | POA: Diagnosis present

## 2021-09-01 DIAGNOSIS — I10 Essential (primary) hypertension: Secondary | ICD-10-CM | POA: Insufficient documentation

## 2021-09-01 MED ORDER — LIDOCAINE 4 % EX PTCH
1.0000 | MEDICATED_PATCH | Freq: Every day | CUTANEOUS | 0 refills | Status: AC
Start: 2021-09-01 — End: ?
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
## 2021-09-01 NOTE — ED Notes (Signed)
ED Provider at bedside. 

## 2021-10-07 ENCOUNTER — Other Ambulatory Visit: Payer: Self-pay | Admitting: Student

## 2021-10-07 ENCOUNTER — Other Ambulatory Visit (HOSPITAL_COMMUNITY): Payer: Self-pay | Admitting: Student

## 2021-10-07 DIAGNOSIS — M5416 Radiculopathy, lumbar region: Secondary | ICD-10-CM

## 2021-10-16 ENCOUNTER — Ambulatory Visit (HOSPITAL_COMMUNITY)
Admission: RE | Admit: 2021-10-16 | Discharge: 2021-10-16 | Disposition: A | Discharge status: Home / Self Care | Payer: Self-pay | Source: Ambulatory Visit | Attending: Student | Admitting: Student

## 2021-10-16 ENCOUNTER — Other Ambulatory Visit: Payer: Self-pay

## 2021-10-16 DIAGNOSIS — M5416 Radiculopathy, lumbar region: Secondary | ICD-10-CM | POA: Insufficient documentation

## 2021-10-26 IMAGING — CT CT ANGIO CHEST
3 of 7 series · 16 of 36 positions shown · IV contrast (OMNIPAQUE 350)
Comparison: Chest radiograph February 20, 2020

CLINICAL DATA: Chest pain with hemoptysis.

EXAM:
CT ANGIOGRAPHY CHEST WITH CONTRAST
TECHNIQUE: Multidetector CT imaging of the chest was performed using the
standard protocol during bolus administration of intravenous
contrast. Multiplanar CT image reconstructions and MIPs were
obtained to evaluate the vascular anatomy.
CONTRAST:  100mL OMNIPAQUE IOHEXOL 350 MG/ML SOLN

[Series 5: thins · axial · 0.76mm/px · z∈[+1454,+1711]mm · 13 of 301 slices shown]
[im 22/301  lung]
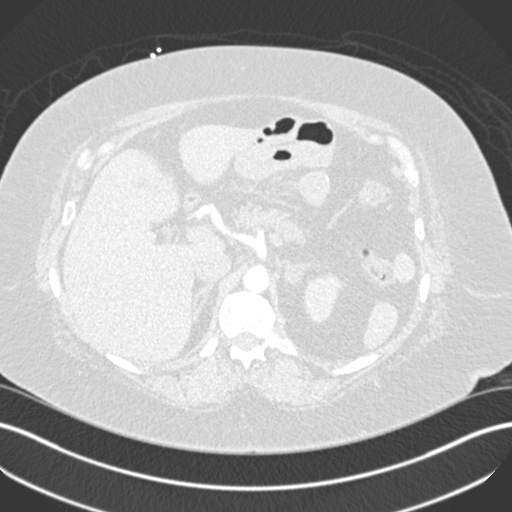
[im 43/301  mediastinal]
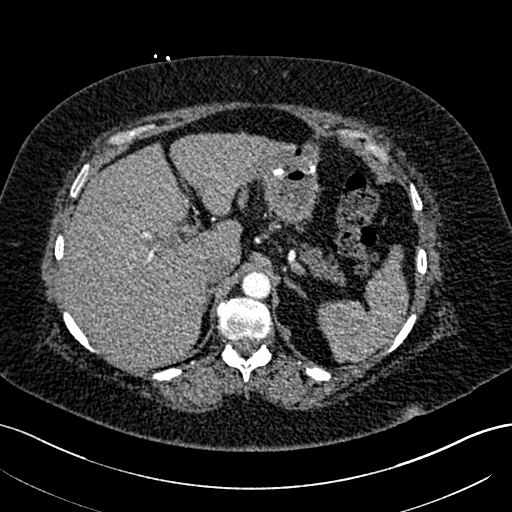
[im 65/301  lung]
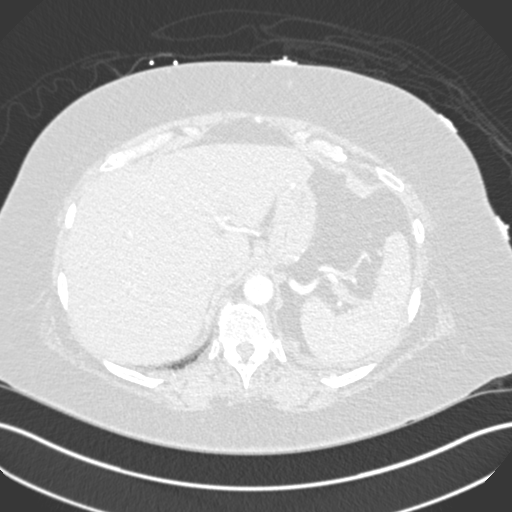
[im 86/301  mediastinal]
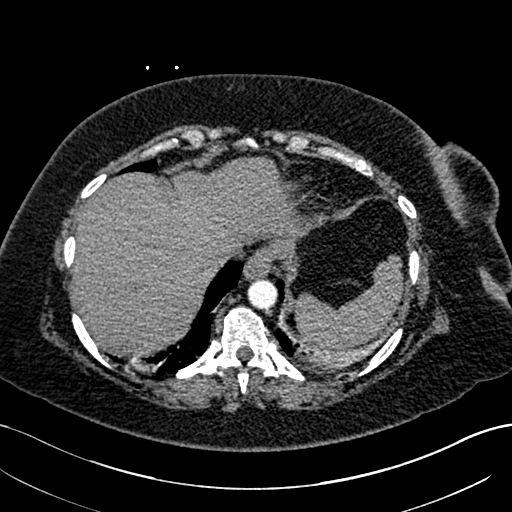
[im 108/301  lung]
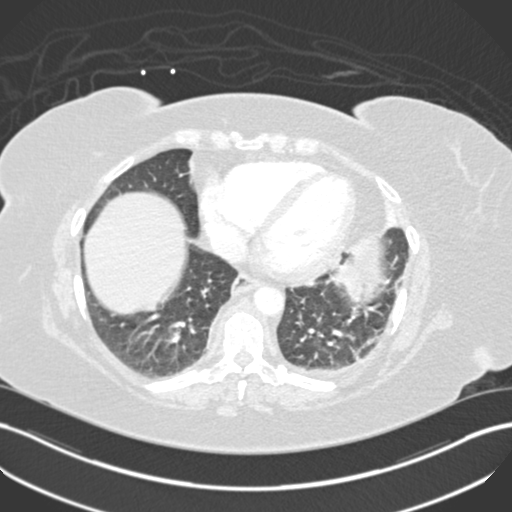
[im 129/301  mediastinal]
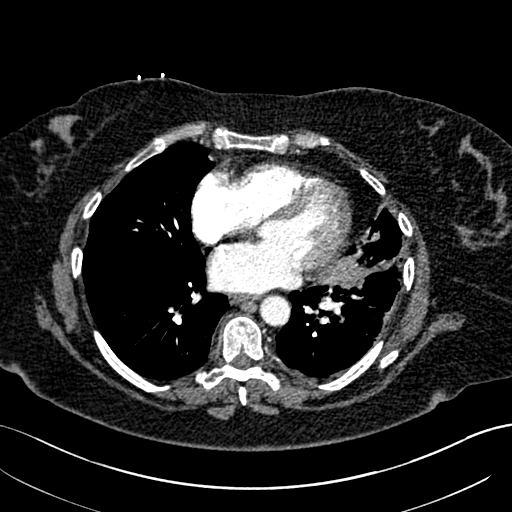
[im 151/301  lung]
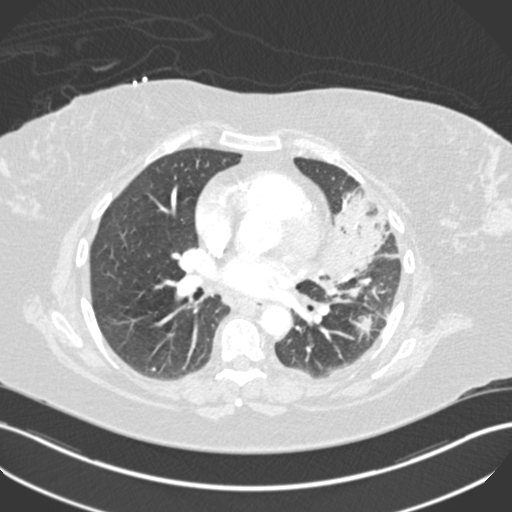
[im 172/301  mediastinal]
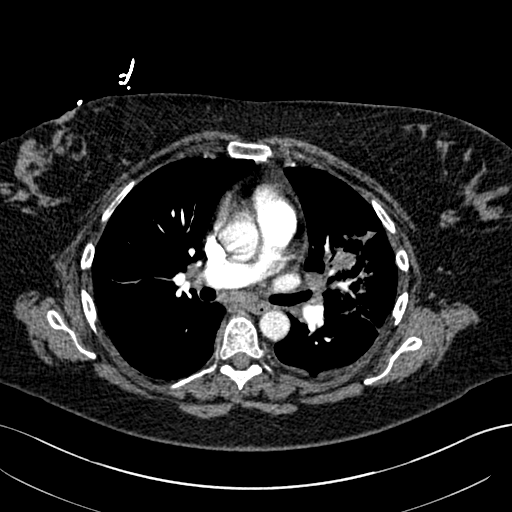
[im 193/301  lung]
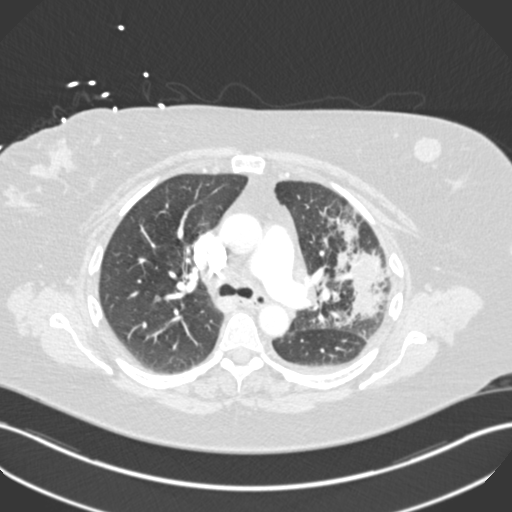
[im 215/301  mediastinal]
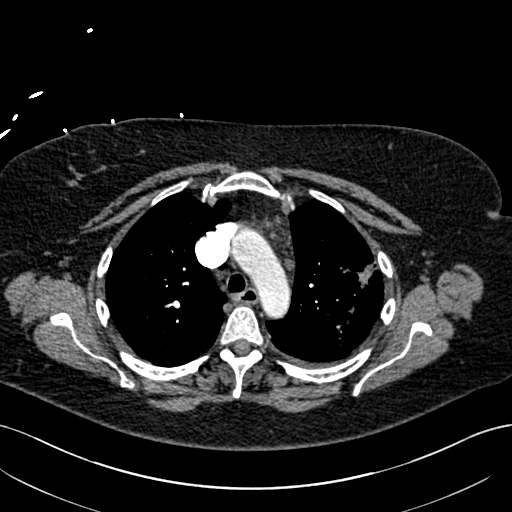
[im 236/301  lung]
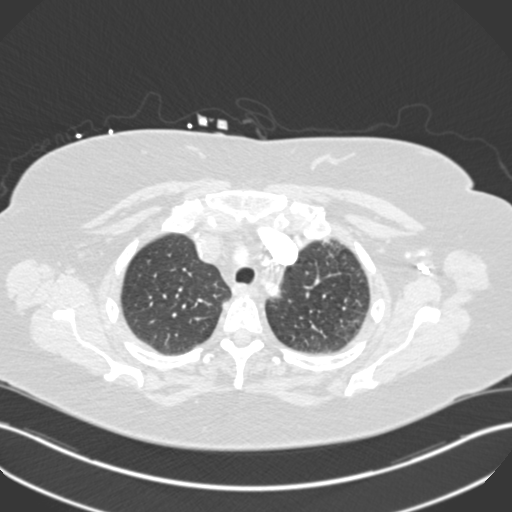
[im 258/301  mediastinal]
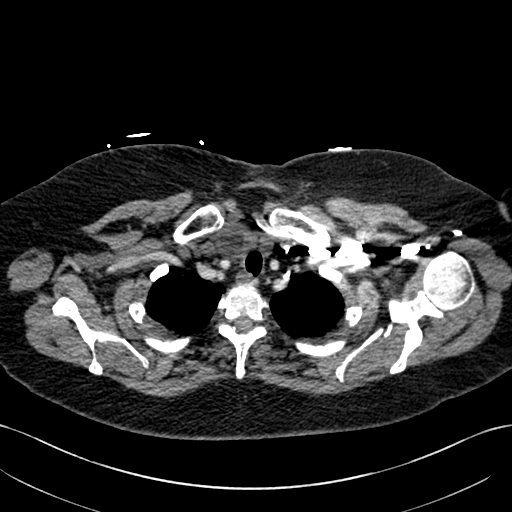
[im 279/301  lung]
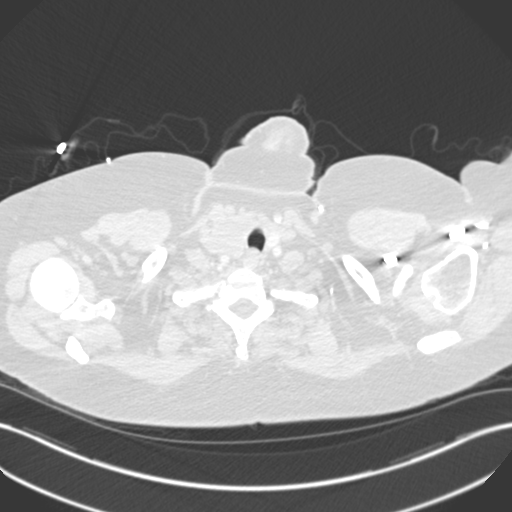

[Series 6: coronal mpr · coronal · 0.62mm/px · 1 of 139 slices shown]
[im 70/139  mediastinal]
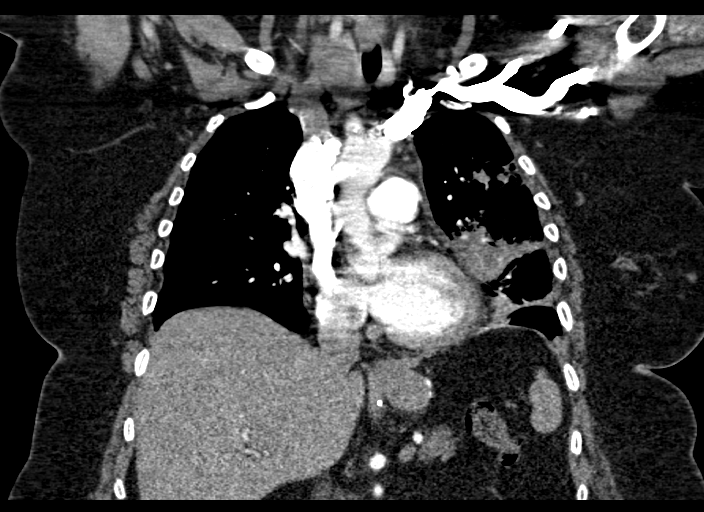

[Series 10: lung · axial · 0.76mm/px · z∈[+1533,+1583]mm · 2 of 125 slices shown]
[im 25/125  mediastinal]
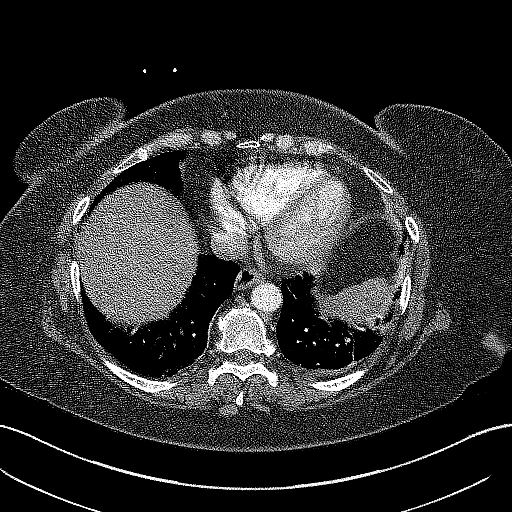
[im 50/125  mediastinal]
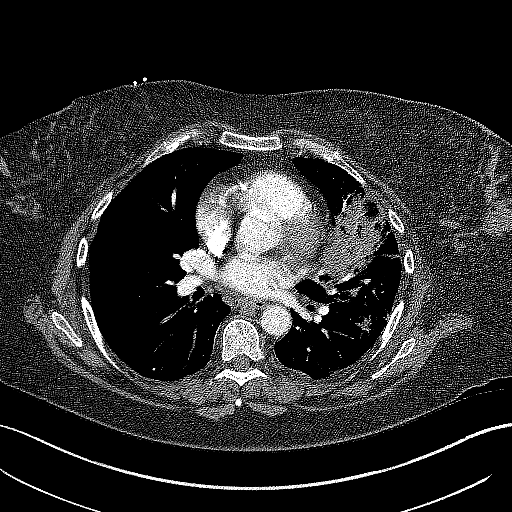

[16 of 36 positions shown; findings below may reference images not displayed]

FINDINGS: Cardiovascular: There is no demonstrable pulmonary embolus. There is
no thoracic aortic aneurysm or dissection. Visualized great vessels
appear unremarkable. No pericardial effusion or pericardial
thickening is evident.

Mediastinum/Nodes: There is a decreased attenuation mass arising
from the right lobe of the thyroid measuring 3.6 x 3.1 x 3.0 cm. The
attenuation of this mass suggests that it represents a cystic
lesion.

There are prominent lymph nodes in the aortopulmonary window region
and adjacent to the descending thoracic aorta. The largest lymph
node in this area measures 1.6 x 1.4 cm. Other lymph nodes in this
region have short axis diameters of 1-1.2 cm as well as several
subcentimeter lymph nodes in this area. There is a lymph node to the
right of the trachea anteriorly measuring 1.2 x 1.1 cm. There is a
subcarinal lymph node measuring 1.4 x 1.3 cm. There is a left hilar
lymph node measuring 1.4 x 1.3 cm. There is a right hilar lymph node
measuring 1.2 x 0.9 cm.

There is a small hiatal hernia.

Lungs/Pleura: There is airspace consolidation throughout much of the
left upper lobe with involvement of anterior and posterior segments.
There is a somewhat nodular appearing opacity anterior segment of
the left upper lobe measuring 1.3 x 1.2 cm. This nodular opacity is
adjacent to areas of more consolidated airspace opacity. A second
somewhat nodular appearing opacity is noted in the posterior segment
left upper lobe abutting the minor fissure measuring 0.9 x 0.9 cm.
There is airspace opacity throughout much of the lingula as well as
patchy infiltrate in the left lower lobe anteriorly. Opacity in the
anterior segment left lower lobe has a somewhat nodular appearance
measuring 2.3 x 1.4 cm. No cavitation noted in any of these areas.
There is mild consolidation in the posterior left base which in part
may be due to atelectasis. There is atelectatic change in the
posterior right base as well. Right lung is otherwise clear. No
appreciable pleural effusions.

Upper Abdomen: Evidence of previous gastric bypass surgery and
cholecystectomy. No bowel wall thickening or fluid in visualized
postoperative areas. There is a left adrenal adenoma measuring 1.1 x
1.1 cm. There is a right adrenal adenoma measuring 0.7 x 0.6 cm.
Visualized upper abdominal structures otherwise appear normal.

Musculoskeletal: No blastic or lytic bone lesions. There are foci of
degenerative change in the thoracic spine. No chest wall lesions are
evident.

Review of the MIP images confirms the above findings.
IMPRESSION: 1.  No demonstrable pulmonary embolus.  No thoracic adenopathy.

2. Multifocal pneumonia throughout much of the left upper lobe and
lingula regions as well as in the anterior segment of the left upper
lobe. With in these areas of infiltrate, there are several somewhat
nodular appearing opacities which may well represent pneumonia but
conceivably could represent underlying neoplasm. This finding
warrants follow-up study in 4-6 weeks after appropriate treatment
for pneumonia. No cavitation evident. Note that there is bibasilar
atelectatic change, more on the left than the right. A degree of
superimposed pneumonia in the posterior left base is questioned.

3. Adenopathy at several sites. This adenopathy may well be of
reactive etiology given the extensive pneumonitis in the left lung.
Neoplastic etiology cannot be excluded, however. Particular
attention to these lymph nodes on subsequent evaluations warranted.

4. Dominant right lobe thyroid mass measuring 3.6 x 3.1 x 3.0 cm.
Recommend thyroid US. (Ref: [HOSPITAL]. [DATE]):
143-50). The attenuation of this lesion suggests that it is cystic
in nature.

5.  Small hiatal hernia.

6. Postoperative gastric bypass procedure without visualized
complicating features.

7.  Gallbladder absent.

8.  Small benign adrenal adenomas bilaterally.

## 2022-03-01 ENCOUNTER — Encounter: Payer: Self-pay | Admitting: *Deleted

## 2023-08-18 ENCOUNTER — Encounter (HOSPITAL_COMMUNITY): Payer: Self-pay | Admitting: *Deleted

## 2024-03-05 ENCOUNTER — Encounter: Payer: Self-pay | Admitting: *Deleted
